# Patient Record
Sex: Male | Born: 1955
Health system: Southern US, Community
[De-identification: ages and names within clinical notes are randomized; demographics above are authoritative.]

## PROBLEM LIST (undated history)

## (undated) DIAGNOSIS — M1990 Unspecified osteoarthritis, unspecified site: Secondary | ICD-10-CM

## (undated) DIAGNOSIS — Q231 Congenital insufficiency of aortic valve: Secondary | ICD-10-CM

## (undated) DIAGNOSIS — R011 Cardiac murmur, unspecified: Secondary | ICD-10-CM

## (undated) DIAGNOSIS — H9319 Tinnitus, unspecified ear: Secondary | ICD-10-CM

## (undated) DIAGNOSIS — M549 Dorsalgia, unspecified: Secondary | ICD-10-CM

## (undated) DIAGNOSIS — I059 Rheumatic mitral valve disease, unspecified: Secondary | ICD-10-CM

## (undated) DIAGNOSIS — T7840XA Allergy, unspecified, initial encounter: Secondary | ICD-10-CM

## (undated) DIAGNOSIS — M5030 Other cervical disc degeneration, unspecified cervical region: Secondary | ICD-10-CM

## (undated) DIAGNOSIS — R972 Elevated prostate specific antigen [PSA]: Secondary | ICD-10-CM

## (undated) DIAGNOSIS — Z8619 Personal history of other infectious and parasitic diseases: Secondary | ICD-10-CM

## (undated) DIAGNOSIS — G43909 Migraine, unspecified, not intractable, without status migrainosus: Secondary | ICD-10-CM

## (undated) DIAGNOSIS — Q2381 Bicuspid aortic valve: Secondary | ICD-10-CM

## (undated) DIAGNOSIS — I10 Essential (primary) hypertension: Secondary | ICD-10-CM

## (undated) HISTORY — DX: Migraine, unspecified, not intractable, without status migrainosus: G43.909

## (undated) HISTORY — PX: POLYPECTOMY: SHX149

## (undated) HISTORY — PX: WISDOM TOOTH EXTRACTION: SHX21

## (undated) HISTORY — DX: Dorsalgia, unspecified: M54.9

## (undated) HISTORY — DX: Essential (primary) hypertension: I10

## (undated) HISTORY — PX: OTHER SURGICAL HISTORY: SHX169

## (undated) HISTORY — DX: Personal history of other infectious and parasitic diseases: Z86.19

## (undated) HISTORY — PX: SPINE SURGERY: SHX786

## (undated) HISTORY — DX: Elevated prostate specific antigen (PSA): R97.20

## (undated) HISTORY — DX: Cardiac murmur, unspecified: R01.1

## (undated) HISTORY — DX: Bicuspid aortic valve: Q23.81

## (undated) HISTORY — DX: Tinnitus, unspecified ear: H93.19

## (undated) HISTORY — PX: CYST REMOVAL NECK: SHX6281

## (undated) HISTORY — DX: Allergy, unspecified, initial encounter: T78.40XA

## (undated) HISTORY — DX: Rheumatic mitral valve disease, unspecified: I05.9

## (undated) HISTORY — PX: CERVICAL SPINE SURGERY: SHX589

## (undated) HISTORY — DX: Congenital insufficiency of aortic valve: Q23.1

## (undated) HISTORY — DX: Other cervical disc degeneration, unspecified cervical region: M50.30

## (undated) HISTORY — DX: Unspecified osteoarthritis, unspecified site: M19.90

---

## 2007-03-28 ENCOUNTER — Ambulatory Visit: Payer: Self-pay | Admitting: Family Medicine

## 2007-04-17 ENCOUNTER — Ambulatory Visit: Payer: Self-pay | Admitting: Family Medicine

## 2007-04-17 ENCOUNTER — Ambulatory Visit: Payer: Self-pay | Admitting: Gastroenterology

## 2007-04-18 ENCOUNTER — Ambulatory Visit: Payer: Self-pay | Admitting: Family Medicine

## 2007-04-18 LAB — CONVERTED CEMR LAB
BUN: 17 mg/dL (ref 6–23)
Basophils Absolute: 0 10*3/uL (ref 0.0–0.1)
Basophils Relative: 0.5 % (ref 0.0–1.0)
Calcium: 9.2 mg/dL (ref 8.4–10.5)
Chloride: 108 meq/L (ref 96–112)
Creatinine, Ser: 1.1 mg/dL (ref 0.4–1.5)
Glucose, Bld: 103 mg/dL — ABNORMAL HIGH (ref 70–99)
Hemoglobin: 16.2 g/dL (ref 13.0–17.0)
MCV: 94.5 fL (ref 78.0–100.0)
Monocytes Absolute: 0.5 10*3/uL (ref 0.2–0.7)
Neutro Abs: 6.3 10*3/uL (ref 1.4–7.7)
Platelets: 239 10*3/uL (ref 150–400)
Potassium: 4.1 meq/L (ref 3.5–5.1)
RDW: 12.5 % (ref 11.5–14.6)
Sodium: 142 meq/L (ref 135–145)
WBC: 9.7 10*3/uL (ref 4.5–10.5)

## 2007-04-25 ENCOUNTER — Ambulatory Visit: Payer: Self-pay | Admitting: Family Medicine

## 2007-05-01 ENCOUNTER — Encounter: Payer: Self-pay | Admitting: Gastroenterology

## 2007-05-01 ENCOUNTER — Ambulatory Visit: Payer: Self-pay | Admitting: Gastroenterology

## 2008-06-24 ENCOUNTER — Ambulatory Visit: Payer: Self-pay | Admitting: Family Medicine

## 2008-06-24 LAB — CONVERTED CEMR LAB
Albumin: 4.3 g/dL (ref 3.5–5.2)
Basophils Absolute: 0.1 10*3/uL (ref 0.0–0.1)
Basophils Relative: 0.6 % (ref 0.0–3.0)
Blood in Urine, dipstick: NEGATIVE
Calcium: 9.1 mg/dL (ref 8.4–10.5)
Chloride: 105 meq/L (ref 96–112)
Cholesterol: 157 mg/dL (ref 0–200)
Creatinine, Ser: 1 mg/dL (ref 0.4–1.5)
Eosinophils Absolute: 0.2 10*3/uL (ref 0.0–0.7)
Eosinophils Relative: 2.9 % (ref 0.0–5.0)
Hemoglobin: 16.4 g/dL (ref 13.0–17.0)
Ketones, urine, test strip: NEGATIVE
MCHC: 35.3 g/dL (ref 30.0–36.0)
MCV: 96.1 fL (ref 78.0–100.0)
Monocytes Relative: 7 % (ref 3.0–12.0)
Neutrophils Relative %: 56.4 % (ref 43.0–77.0)
Nitrite: NEGATIVE
Potassium: 4.3 meq/L (ref 3.5–5.1)
TSH: 2.41 microintl units/mL (ref 0.35–5.50)
Total Bilirubin: 0.9 mg/dL (ref 0.3–1.2)
Total CHOL/HDL Ratio: 2.2
Total Protein: 7 g/dL (ref 6.0–8.3)
VLDL: 10 mg/dL (ref 0–40)
WBC Urine, dipstick: NEGATIVE
pH: 7.5

## 2008-07-07 ENCOUNTER — Ambulatory Visit: Payer: Self-pay | Admitting: Family Medicine

## 2009-06-29 ENCOUNTER — Ambulatory Visit: Payer: Self-pay | Admitting: Family Medicine

## 2009-06-29 LAB — CONVERTED CEMR LAB
AST: 24 units/L (ref 0–37)
BUN: 17 mg/dL (ref 6–23)
Basophils Absolute: 0 10*3/uL (ref 0.0–0.1)
Basophils Relative: 0.5 % (ref 0.0–3.0)
Bilirubin Urine: NEGATIVE
Blood in Urine, dipstick: NEGATIVE
CO2: 28 meq/L (ref 19–32)
Creatinine, Ser: 1 mg/dL (ref 0.4–1.5)
Eosinophils Absolute: 0.2 10*3/uL (ref 0.0–0.7)
Eosinophils Relative: 2 % (ref 0.0–5.0)
GFR calc non Af Amer: 83.67 mL/min (ref >60)
Hemoglobin: 16.5 g/dL (ref 13.0–17.0)
LDL Cholesterol: 84 mg/dL (ref 0–99)
Lymphocytes Relative: 31.2 % (ref 12.0–46.0)
Lymphs Abs: 2.6 10*3/uL (ref 0.7–4.0)
MCHC: 34.2 g/dL (ref 30.0–36.0)
MCV: 96.9 fL (ref 78.0–100.0)
Monocytes Absolute: 0.5 10*3/uL (ref 0.1–1.0)
Monocytes Relative: 6.5 % (ref 3.0–12.0)
Neutro Abs: 5 10*3/uL (ref 1.4–7.7)
Potassium: 4.7 meq/L (ref 3.5–5.1)
Total Bilirubin: 0.8 mg/dL (ref 0.3–1.2)
Total CHOL/HDL Ratio: 2
Triglycerides: 43 mg/dL (ref 0.0–149.0)
VLDL: 8.6 mg/dL (ref 0.0–40.0)
pH: 6

## 2009-07-08 ENCOUNTER — Ambulatory Visit: Payer: Self-pay | Admitting: Family Medicine

## 2009-07-08 DIAGNOSIS — J310 Chronic rhinitis: Secondary | ICD-10-CM

## 2009-07-08 DIAGNOSIS — H9319 Tinnitus, unspecified ear: Secondary | ICD-10-CM | POA: Insufficient documentation

## 2009-07-08 DIAGNOSIS — I059 Rheumatic mitral valve disease, unspecified: Secondary | ICD-10-CM

## 2009-07-08 DIAGNOSIS — R972 Elevated prostate specific antigen [PSA]: Secondary | ICD-10-CM | POA: Insufficient documentation

## 2009-07-08 DIAGNOSIS — F17200 Nicotine dependence, unspecified, uncomplicated: Secondary | ICD-10-CM | POA: Insufficient documentation

## 2009-07-09 ENCOUNTER — Telehealth: Payer: Self-pay | Admitting: *Deleted

## 2010-02-18 ENCOUNTER — Ambulatory Visit: Payer: Self-pay | Admitting: Family Medicine

## 2010-02-18 DIAGNOSIS — M5030 Other cervical disc degeneration, unspecified cervical region: Secondary | ICD-10-CM

## 2010-03-01 ENCOUNTER — Encounter: Admission: RE | Admit: 2010-03-01 | Discharge: 2010-05-03 | Payer: Self-pay | Admitting: Family Medicine

## 2010-03-18 ENCOUNTER — Ambulatory Visit: Payer: Self-pay | Admitting: Family Medicine

## 2010-03-24 ENCOUNTER — Encounter (INDEPENDENT_AMBULATORY_CARE_PROVIDER_SITE_OTHER): Payer: Self-pay | Admitting: *Deleted

## 2010-06-15 ENCOUNTER — Telehealth: Payer: Self-pay | Admitting: Family Medicine

## 2010-11-06 HISTORY — PX: COLONOSCOPY: SHX174

## 2010-12-07 NOTE — Progress Notes (Signed)
Summary: Refill Hydroco/APAP  Phone Note From Pharmacy   Caller: [facility] [address] [phone]* Summary of Call: Refill request for Hydroco/APAP 7.5/750 Tab for quanity of 30. Last fill date was: 03/18/10 per our computer system. Pharmacy last fill date is: 05/14/10 Initial call taken by: Kathrynn Speed CMA,  June 15, 2010 1:47 PM  Follow-up for Phone Call        if patient requires continued use of pain medication and has not gone to PT would recommend PT if these gone to PT, then, we need a consult from neurosurgery.  Dispense 20 tablets and given this information Follow-up by: Roderick Pee MD,  June 16, 2010 8:34 AM  Additional Follow-up for Phone Call Additional follow up Details #1::        left message on machine for patient.  rx called in Additional Follow-up by: Kern Reap CMA Duncan Dull),  June 17, 2010 2:53 PM

## 2010-12-07 NOTE — Letter (Signed)
Summary: Colonoscopy Letter  [facility]  [address] [place], [place] [address]   Phone: [phone]  Fax: [phone]      Mar 24, 2010 MRN: [id number]   David Cunningham [address] [place], [place]  [address]   Dear Mr. Wandrey,   According to your medical record, it is time for you to schedule a Colonoscopy. The American Cancer Society recommends this procedure as a method to detect early colon cancer. Patients with a family history of colon cancer, or a personal history of colon polyps or inflammatory bowel disease are at increased risk.  This letter has beeen generated based on the recommendations made at the time of your procedure. If you feel that in your particular situation this may no longer apply, please contact our office.  Please call our office at [phone] to schedule this appointment or to update your records at your earliest convenience.  Thank you for cooperating with [place] to provide you with the very best care possible.   Sincerely,   Vania Rea. Jarold Motto, M.D.  [facility] [phone]

## 2010-12-07 NOTE — Assessment & Plan Note (Signed)
Summary: fu from physical therapy/njr   Vital Signs:  Patient profile:   55 year old male Weight:      162 pounds Temp:     98.1 degrees F oral BP sitting:   120 / 70  (left arm) CC: FU/ Phy Thp   CC:  FU/ Phy Thp.  History of Present Illness: David Cunningham is a 55 year old, married male, smoker, who comes back today for follow-up of neck pain.  We saw him a couple weeks ago with atraumatic.  Neck pain at that time.  His neurologic exam is normal.  X-rays show some degenerative changes with a bone spur and   foraminal narrowing at C4-C5.  We started him on physical therapy Motrin, Flexeril, and Vicodin with a soft cervical collar at bed  time.  He states his pain had decreased to about a 3 from an 8 until Tuesday of this week when he played golf and now is pain = 8.neurologic review of systems again, negative.  No numbness, weakness, etc.  I emphasized the importance of smoking cessation however, he is not started the program, that we outlined encouraged him to stop smoking and start the chantix program as outlined  Allergies: No Known Drug Allergies  Social History: Reviewed history from 07/08/2009 and no changes required. Occupation: Married Alcohol use-no Drug use-no Regular exercise-yes Current Smoker  Review of Systems      See HPI  Physical Exam  General:  Well-developed,well-nourished,in no acute distress; alert,appropriate and cooperative throughout examination Msk:  No deformity or scoliosis noted of thoracic or lumbar spine.   Pulses:  R and L carotid,radial,femoral,dorsalis pedis and posterior tibial pulses are full and equal bilaterally Extremities:  No clubbing, cyanosis, edema, or deformity noted with normal full range of motion of all joints.   Neurologic:  No cranial nerve deficits noted. Station and gait are normal. Plantar reflexes are down-going bilaterally. DTRs are symmetrical throughout. Sensory, motor and coordinative functions appear intact.   Impression  & Recommendations:  Problem # 1:  DEGENERATIVE DISC DISEASE, CERVICAL SPINE (ICD-722.4) Assessment Unchanged  Orders: Neurosurgeon Referral (Neurosurgeon) Tobacco use cessation intermediate 3-10 minutes (25956)  Complete Medication List: 1)  Chantix Starting Month Pak 0.5 Mg X 11 & 1 Mg X 42 Tabs (Varenicline tartrate) .... Uad 2)  Chantix Continuing Month Pak 1 Mg Tabs (Varenicline tartrate) .... Uad 3)  Zyrtec Hives Relief 10 Mg Tabs (Cetirizine hcl) .... One tab by mouth once daily 4)  Flexeril 10 Mg Tabs (Cyclobenzaprine hcl) .Marland Kitchen.. 1 tab @ bedtime 5)  Vicodin Es 7.5-750 Mg Tabs (Hydrocodone-acetaminophen) .Marland Kitchen.. 1 tab @ bedtime  Patient Instructions: 1)  we will set you up a consult with Dr. Barnett Abu neurosurgeon for further evaluation.  In the meantime, continue the treatment program that was outlined. 2)  Strongly strongly consider starting the chantix program!!!!!!!!!!!! Prescriptions: VICODIN ES 7.5-750 MG TABS (HYDROCODONE-ACETAMINOPHEN) 1 tab @ bedtime  #40 x 1   Entered and Authorized by:   Roderick Pee MD   Signed by:   Roderick Pee MD on 03/18/2010   Method used:   Print then Give to Patient   RxID:   [id number] FLEXERIL 10 MG TABS (CYCLOBENZAPRINE HCL) 1 tab @ bedtime  #40 x 1   Entered and Authorized by:   Roderick Pee MD   Signed by:   Roderick Pee MD on 03/18/2010   Method used:   Print then Give to Patient   RxID:   [id number]

## 2010-12-07 NOTE — Assessment & Plan Note (Signed)
Summary: neck pain//ccm   Vital Signs:  Patient profile:   55 year old male Weight:      162 pounds BP sitting:   124 / 78  (left arm) Cuff size:   regular  Vitals Entered By: Kern Reap CMA Duncan Dull) (February 18, 2010 10:57 AM) CC: neck pain Is Patient Diabetic? No Pain Assessment Patient in pain? yes     Location: neck Intensity: 5 Type: shooting   CC:  neck pain.  History of Present Illness: David Cunningham is a 55 year old male, smoker, who comes in today for evaluation of neck pain.  He states about two months ago he began having neck pain.  He points to the C2, C3, area in the midline.  He, states it's constant does not radiate however, his pain is increased over the last month, now, as an 8 on a scale of one to 10 and he can't sleep at night.  No previous history of pain like this before.  He said no sensory loss.  No change in muscle strength.  Again, no history of trauma.  Back in September.  We put him on a chantix program for smoking cessation.  He never got it filled  Allergies: No Known Drug Allergies  Past History:  Past medical, surgical, family and social histories (including risk factors) reviewed for relevance to current acute and chronic problems.  Past Medical History: Reviewed history from 07/07/2008 and no changes required. colon polyps.  Colonoscopy x 2 last 2008  Family History: Reviewed history from 07/08/2009 and no changes required. father diabetic, lymphoma, asbestosis.  Mother died of lung cancer smoker.  No brothers.  Three sisters, 200 health one has breast cancer  Social History: Reviewed history from 07/08/2009 and no changes required. Occupation: Married Alcohol use-no Drug use-no Regular exercise-yes Current Smoker  Review of Systems      See HPI  Physical Exam  General:  Well-developed,well-nourished,in no acute distress; alert,appropriate and cooperative throughout examination Head:  Normocephalic and atraumatic without  obvious abnormalities. No apparent alopecia or balding. Eyes:  No corneal or conjunctival inflammation noted. EOMI. Perrla. Funduscopic exam benign, without hemorrhages, exudates or papilledema. Vision grossly normal. Ears:  External ear exam shows no significant lesions or deformities.  Otoscopic examination reveals clear canals, tympanic membranes are intact bilaterally without bulging, retraction, inflammation or discharge. Hearing is grossly normal bilaterally. Nose:  External nasal examination shows no deformity or inflammation. Nasal mucosa are pink and moist without lesions or exudates. Mouth:  Oral mucosa and oropharynx without lesions or exudates.  Teeth in good repair. Msk:  No deformity or scoliosis noted of thoracic or lumbar spine.   Pulses:  R and L carotid,radial,femoral,dorsalis pedis and posterior tibial pulses are full and equal bilaterally Extremities:  No clubbing, cyanosis, edema, or deformity noted with normal full range of motion of all joints.   Neurologic:  No cranial nerve deficits noted. Station and gait are normal. Plantar reflexes are down-going bilaterally. DTRs are symmetrical throughout. Sensory, motor and coordinative functions appear intact.   Impression & Recommendations:  Problem # 1:  DEGENERATIVE DISC DISEASE, CERVICAL SPINE (ICD-722.4) Assessment New  Orders: T-Cervical Spine Comp w/Flex & Ext ([id number])  Problem # 2:  TOBACCO ABUSE (ICD-305.1) Assessment: Unchanged  His updated medication list for this problem includes:    Chantix Starting Month Pak 0.5 Mg X 11 & 1 Mg X 42 Tabs (Varenicline tartrate) ..... Uad    Chantix Continuing Month Pak 1 Mg Tabs (Varenicline tartrate) ..... Uad  Complete  Medication List: 1)  Chantix Starting Month Pak 0.5 Mg X 11 & 1 Mg X 42 Tabs (Varenicline tartrate) .... Uad 2)  Chantix Continuing Month Pak 1 Mg Tabs (Varenicline tartrate) .... Uad 3)  Zyrtec Hives Relief 10 Mg Tabs (Cetirizine hcl) .... One tab by mouth  once daily 4)  Flexeril 10 Mg Tabs (Cyclobenzaprine hcl) .Marland Kitchen.. 1 tab @ bedtime 5)  Vicodin Es 7.5-750 Mg Tabs (Hydrocodone-acetaminophen) .Marland Kitchen.. 1 tab @ bedtime  Patient Instructions: 1)  wear the soft cervical collar at bedtime, take 600 mg of Motrin 3 times a day with food, take a half of a Flexeril and Vicodin at bedtime, 2)  Go to the main office now for x-rays of the neck. 3)  We will get you set up to see physical therapy ASAP. 4)  After a week of physical therapy see me for follow-up.  Remember if he developed any neurologic deficit.  Call immediately Prescriptions: CHANTIX CONTINUING MONTH PAK 1 MG TABS (VARENICLINE TARTRATE) UAD  #1 x 4   Entered and Authorized by:   Roderick Pee MD   Signed by:   Roderick Pee MD on 02/18/2010   Method used:   Print then Give to Patient   RxID:   [id number] CHANTIX STARTING MONTH PAK 0.5 MG X 11 & 1 MG X 42 TABS (VARENICLINE TARTRATE) UAD  #1 x 0   Entered and Authorized by:   Roderick Pee MD   Signed by:   Roderick Pee MD on 02/18/2010   Method used:   Print then Give to Patient   RxID:   [id number] VICODIN ES 7.5-750 MG TABS (HYDROCODONE-ACETAMINOPHEN) 1 tab @ bedtime  #40 x 1   Entered and Authorized by:   Roderick Pee MD   Signed by:   Roderick Pee MD on 02/18/2010   Method used:   Print then Give to Patient   RxID:   [id number] FLEXERIL 10 MG TABS (CYCLOBENZAPRINE HCL) 1 tab @ bedtime  #40 x 1   Entered and Authorized by:   Roderick Pee MD   Signed by:   Roderick Pee MD on 02/18/2010   Method used:   Print then Give to Patient   RxID:   [id number]   Appended Document: neck pain//ccm     Allergies: No Known Drug Allergies   Complete Medication List: 1)  Chantix Starting Month Pak 0.5 Mg X 11 & 1 Mg X 42 Tabs (Varenicline tartrate) .... Uad 2)  Chantix Continuing Month Pak 1 Mg Tabs (Varenicline tartrate) .... Uad 3)  Zyrtec Hives Relief 10 Mg Tabs (Cetirizine hcl) .... One tab by mouth once  daily 4)  Flexeril 10 Mg Tabs (Cyclobenzaprine hcl) .Marland Kitchen.. 1 tab @ bedtime 5)  Vicodin Es 7.5-750 Mg Tabs (Hydrocodone-acetaminophen) .Marland Kitchen.. 1 tab @ bedtime  Other Orders: Physical Therapy Referral (PT)

## 2011-02-01 ENCOUNTER — Encounter (INDEPENDENT_AMBULATORY_CARE_PROVIDER_SITE_OTHER): Payer: Self-pay | Admitting: *Deleted

## 2011-02-07 NOTE — Letter (Signed)
Summary: Pre Visit Letter Revised  [facility]  [address] [place], [place] [address]   Phone: [phone]  Fax: [phone]        02/01/2011 MRN: [id number] David Cunningham [address] [place], [place]  [address]             Procedure Date:  03/13/2011 @ 9:30   Recall colon-Dr. Jarold Motto   Welcome to the Gastroenterology Division at [facility].    You are scheduled to see a nurse for your pre-procedure visit on 02/28/2011 at 9:00 on the 3rd floor at [facility], [address]  We ask that you try to arrive at our office 15 minutes prior to your appointment time to allow for check-in.  Please take a minute to review the attached form.  If you answer "Yes" to one or more of the questions on the first page, we ask that you call the person listed at your earliest opportunity.  If you answer "No" to all of the questions, please complete the rest of the form and bring it to your appointment.    Your nurse visit will consist of discussing your medical and surgical history, your immediate family medical history, and your medications.   If you are unable to list all of your medications on the form, please bring the medication bottles to your appointment and we will list them.  We will need to be aware of both prescribed and over the counter drugs.  We will need to know exact dosage information as well.    Please be prepared to read and sign documents such as consent forms, a financial agreement, and acknowledgement forms.  If necessary, and with your consent, a friend or relative is welcome to sit-in on the nurse visit with you.  Please bring your insurance card so that we may make a copy of it.  If your insurance requires a referral to see a specialist, please bring your referral form from your primary care physician.  No co-pay is required for this nurse visit.     If you cannot keep your appointment, please call [phone] to cancel or reschedule prior to your  appointment date.  This allows [place] the opportunity to schedule an appointment for another patient in need of care.    Thank you for choosing [facility] for your medical needs.  We appreciate the opportunity to care for you.  Please visit [place] at our website  to learn more about our practice.  Sincerely, The Gastroenterology Division

## 2011-02-24 ENCOUNTER — Other Ambulatory Visit (INDEPENDENT_AMBULATORY_CARE_PROVIDER_SITE_OTHER): Payer: Managed Care, Other (non HMO) | Admitting: Family Medicine

## 2011-02-24 DIAGNOSIS — Z0000 Encounter for general adult medical examination without abnormal findings: Secondary | ICD-10-CM

## 2011-02-24 LAB — CBC WITH DIFFERENTIAL/PLATELET
Basophils Absolute: 0.1 10*3/uL (ref 0.0–0.1)
Eosinophils Absolute: 0.2 10*3/uL (ref 0.0–0.7)
Lymphocytes Relative: 28.4 % (ref 12.0–46.0)
MCHC: 35 g/dL (ref 30.0–36.0)
MCV: 97.6 fl (ref 78.0–100.0)
Monocytes Absolute: 0.5 10*3/uL (ref 0.1–1.0)
Neutrophils Relative %: 63.5 % (ref 43.0–77.0)
Platelets: 229 10*3/uL (ref 150.0–400.0)
RBC: 4.75 Mil/uL (ref 4.22–5.81)

## 2011-02-24 LAB — POCT URINALYSIS DIPSTICK
Bilirubin, UA: NEGATIVE
Blood, UA: NEGATIVE
Nitrite, UA: NEGATIVE
Protein, UA: NEGATIVE
Urobilinogen, UA: 0.2
pH, UA: 7

## 2011-02-24 LAB — LIPID PANEL
HDL: 80.2 mg/dL (ref >39.00)
LDL Cholesterol: 77 mg/dL (ref 0–99)
Total CHOL/HDL Ratio: 2
Triglycerides: 32 mg/dL (ref 0.0–149.0)
VLDL: 6.4 mg/dL (ref 0.0–40.0)

## 2011-02-24 LAB — BASIC METABOLIC PANEL
BUN: 14 mg/dL (ref 6–23)
Calcium: 9.5 mg/dL (ref 8.4–10.5)
Creatinine, Ser: 0.9 mg/dL (ref 0.4–1.5)
GFR: 89.28 mL/min (ref >60.00)
Glucose, Bld: 84 mg/dL (ref 70–99)
Potassium: 4.8 mEq/L (ref 3.5–5.1)
Sodium: 141 mEq/L (ref 135–145)

## 2011-02-24 LAB — HEPATIC FUNCTION PANEL
Alkaline Phosphatase: 49 U/L (ref 39–117)
Bilirubin, Direct: 0.1 mg/dL (ref 0.0–0.3)
Total Bilirubin: 0.8 mg/dL (ref 0.3–1.2)

## 2011-02-28 ENCOUNTER — Encounter: Payer: Self-pay | Admitting: Gastroenterology

## 2011-02-28 ENCOUNTER — Ambulatory Visit (AMBULATORY_SURGERY_CENTER): Payer: Managed Care, Other (non HMO) | Admitting: *Deleted

## 2011-02-28 VITALS — Ht 69.0 in | Wt 170.0 lb

## 2011-02-28 DIAGNOSIS — Z800 Family history of malignant neoplasm of digestive organs: Secondary | ICD-10-CM

## 2011-02-28 DIAGNOSIS — Z86010 Personal history of colonic polyps: Secondary | ICD-10-CM

## 2011-02-28 MED ORDER — PEG-KCL-NACL-NASULF-NA ASC-C 100 G PO SOLR: ORAL | Status: DC

## 2011-03-06 ENCOUNTER — Ambulatory Visit (INDEPENDENT_AMBULATORY_CARE_PROVIDER_SITE_OTHER): Payer: Managed Care, Other (non HMO) | Admitting: Family Medicine

## 2011-03-06 ENCOUNTER — Encounter: Payer: Self-pay | Admitting: Family Medicine

## 2011-03-06 DIAGNOSIS — J309 Allergic rhinitis, unspecified: Secondary | ICD-10-CM

## 2011-03-06 DIAGNOSIS — M5030 Other cervical disc degeneration, unspecified cervical region: Secondary | ICD-10-CM

## 2011-03-06 DIAGNOSIS — R972 Elevated prostate specific antigen [PSA]: Secondary | ICD-10-CM

## 2011-03-06 DIAGNOSIS — F17200 Nicotine dependence, unspecified, uncomplicated: Secondary | ICD-10-CM

## 2011-03-06 DIAGNOSIS — I059 Rheumatic mitral valve disease, unspecified: Secondary | ICD-10-CM

## 2011-03-06 MED ORDER — VARENICLINE TARTRATE 1 MG PO TABS: 1.0000 mg | ORAL_TABLET | Freq: Two times a day (BID) | ORAL | Status: DC

## 2011-03-06 NOTE — Patient Instructions (Signed)
Start the chantix program by taking a half a tablet daily.  If after 6 weeks. u  Do  not stop smoking on  a half a pill a day, then increase the dose to half a tablet twice daily.  Return annually for general physical exam

## 2011-03-06 NOTE — Progress Notes (Signed)
  Subjective:    Patient ID: David Cunningham, male    DOB: Aug 22, 1956, 55 y.o.   MRN: [id number]  HPI  David Cunningham is a 55 year old, married male, smoker,,,,,,,,, his wife also smokes,,,,,,,, who comes in today for general physical examination  He has a history of cervical degenerative disease.  We tried conservative therapy.  It did work.  He went to see the neurosurgeon.  He did not find anything surgically that can do, however, they did again, as we have in the past.  Recommend that he stop smoking, which he is not done.  These might want to try the chantix program.  His history of a mitral valve prolapse and heart murmur currently asymptomatic.  He has a history of allergic rhinitis, which is exacerbated by a smoking.  He did have a follow-up colonoscopy next week, because he's had a history of colon polyps.  He has a history of allergic rhinitis, for which he takes OTC Zyrtec.    Review of Systems  Constitutional: Negative.   HENT: Negative.   Eyes: Negative.   Respiratory: Negative.   Cardiovascular: Negative.   Gastrointestinal: Negative.   Genitourinary: Negative.   Musculoskeletal: Negative.   Skin: Negative.   Neurological: Negative.   Hematological: Negative.   Psychiatric/Behavioral: Negative.        Objective:   Physical Exam  Constitutional: He is oriented to person, place, and time. He appears well-developed and well-nourished.  HENT:  Head: Normocephalic and atraumatic.  Right Ear: External ear normal.  Left Ear: External ear normal.  Nose: Nose normal.  Mouth/Throat: Oropharynx is clear and moist.  Eyes: Conjunctivae and EOM are normal. Pupils are equal, round, and reactive to light.  Neck: Normal range of motion. Neck supple. No JVD present. No tracheal deviation present. No thyromegaly present.  Cardiovascular: Normal rate, regular rhythm and intact distal pulses.  Exam reveals no gallop and no friction rub.   Murmur heard.      Grade 2/6 systolic ejection  murmur heard best around the apex, consistent with mitral regurgitation  Pulmonary/Chest: Effort normal and breath sounds normal. No stridor. No respiratory distress. He has no wheezes. He has no rales. He exhibits no tenderness.  Abdominal: Soft. Bowel sounds are normal. He exhibits no distension and no mass. There is no tenderness. There is no rebound and no guarding.  Genitourinary: Rectum normal, prostate normal and penis normal. Guaiac negative stool. No penile tenderness.  Musculoskeletal: Normal range of motion. He exhibits no edema and no tenderness.  Lymphadenopathy:    He has no cervical adenopathy.  Neurological: He is alert and oriented to person, place, and time. He has normal reflexes. No cranial nerve deficit. He exhibits normal muscle tone.  Skin: Skin is warm and dry. No rash noted. No erythema. No pallor.  Psychiatric: He has a normal mood and affect. His behavior is normal. Judgment and thought content normal.          Assessment & Plan:  Healthy male.  Tobacco abuse,,,,,,,,,,, encouraged to start the chantix program.  Allergic rhinitis.  Continue OTC Zyrtec.  History of cervical degenerative disk disease,,,,,,,,, stop smoking.  History of colon polyps, follow-up colonoscopy in GI with Dr. Jarold Motto

## 2011-03-10 ENCOUNTER — Encounter: Payer: Self-pay | Admitting: Gastroenterology

## 2011-03-13 ENCOUNTER — Ambulatory Visit (AMBULATORY_SURGERY_CENTER): Payer: Managed Care, Other (non HMO) | Admitting: Gastroenterology

## 2011-03-13 ENCOUNTER — Encounter: Payer: Self-pay | Admitting: Gastroenterology

## 2011-03-13 VITALS — BP 155/71 | HR 74 | Temp 96.5°F | Resp 20 | Ht 68.5 in | Wt 165.0 lb

## 2011-03-13 DIAGNOSIS — Z86010 Personal history of colonic polyps: Secondary | ICD-10-CM

## 2011-03-13 DIAGNOSIS — K5730 Diverticulosis of large intestine without perforation or abscess without bleeding: Secondary | ICD-10-CM

## 2011-03-13 DIAGNOSIS — D126 Benign neoplasm of colon, unspecified: Secondary | ICD-10-CM

## 2011-03-13 DIAGNOSIS — Z800 Family history of malignant neoplasm of digestive organs: Secondary | ICD-10-CM

## 2011-03-13 MED ORDER — SODIUM CHLORIDE 0.9 % IV SOLN: 500.0000 mL | INTRAVENOUS | Status: DC

## 2011-03-13 NOTE — Patient Instructions (Signed)
Please review discharge instructions.

## 2011-03-14 ENCOUNTER — Telehealth: Payer: Self-pay

## 2011-03-14 NOTE — Telephone Encounter (Signed)
No answer

## 2011-03-17 ENCOUNTER — Encounter: Payer: Self-pay | Admitting: Gastroenterology

## 2011-11-14 ENCOUNTER — Encounter: Payer: Self-pay | Admitting: Family Medicine

## 2011-11-14 ENCOUNTER — Ambulatory Visit (INDEPENDENT_AMBULATORY_CARE_PROVIDER_SITE_OTHER): Payer: Managed Care, Other (non HMO) | Admitting: Family Medicine

## 2011-11-14 DIAGNOSIS — F17200 Nicotine dependence, unspecified, uncomplicated: Secondary | ICD-10-CM

## 2011-11-14 DIAGNOSIS — J45909 Unspecified asthma, uncomplicated: Secondary | ICD-10-CM

## 2011-11-14 DIAGNOSIS — I059 Rheumatic mitral valve disease, unspecified: Secondary | ICD-10-CM

## 2011-11-14 DIAGNOSIS — J309 Allergic rhinitis, unspecified: Secondary | ICD-10-CM

## 2011-11-14 DIAGNOSIS — J45901 Unspecified asthma with (acute) exacerbation: Secondary | ICD-10-CM

## 2011-11-14 MED ORDER — VARENICLINE TARTRATE 1 MG PO TABS: ORAL_TABLET | ORAL | Status: DC

## 2011-11-14 MED ORDER — PREDNISONE 20 MG PO TABS: ORAL_TABLET | ORAL | Status: DC

## 2011-11-14 MED ORDER — HYDROCODONE-HOMATROPINE 5-1.5 MG/5ML PO SYRP: ORAL_SOLUTION | ORAL | Status: DC

## 2011-11-14 NOTE — Progress Notes (Signed)
  Subjective:    Patient ID: David Cunningham, male    DOB: 04-20-1956, 56 y.o.   MRN: [id number]  HPI David Cunningham is a 56 year old, married male, smoker, who comes in today for evaluation of head congestion and cough for about two weeks.  He went to an urgent care center about for 5 days after developing cold symptoms.  Was given and an ABI, but it didn't help.  He continues to have head congestion, postnasal drip, and cough.  No fever.  He continues to smoke about 10 cigarettes per day.  He states they listen to his neck and heard something in his left carotid.  He has has a history of mitral valve prolapse, but no carotid bruits in the past   Review of Systems    General cardiovascular is systems otherwise negative Objective:   Physical Exam Well-developed well-nourished, male in no acute distress.  HEENT were negative.  Neck was supple.  No adenopathy.  No bruits.  Lungs were clear except for some late mild expiratory wheezing.  Cardiac exam shows a murmur consistent with mitral valve prolapse, which has previously been present.  No changes       Assessment & Plan:  Viral syndrome with secondary asthma.  Tobacco abuse.  Mitral valve prolapse chronic  Plan is to stop smoking completely.  Begin the chantix program, prednisone for the wheezing.  Return in one week for follow-up and

## 2011-11-14 NOTE — Patient Instructions (Signed)
Stop smoking completely and begin D. chantix one half tab daily.  Take the prednisone as directed.  Drink lots of water.  Hydromet one half to 1 teaspoon at bedtime.  Return in one week for follow-up

## 2011-11-21 ENCOUNTER — Encounter: Payer: Self-pay | Admitting: Family Medicine

## 2011-11-21 ENCOUNTER — Ambulatory Visit (INDEPENDENT_AMBULATORY_CARE_PROVIDER_SITE_OTHER): Payer: Managed Care, Other (non HMO) | Admitting: Family Medicine

## 2011-11-21 DIAGNOSIS — J45901 Unspecified asthma with (acute) exacerbation: Secondary | ICD-10-CM

## 2011-11-21 DIAGNOSIS — F17200 Nicotine dependence, unspecified, uncomplicated: Secondary | ICD-10-CM

## 2011-11-21 DIAGNOSIS — J45909 Unspecified asthma, uncomplicated: Secondary | ICD-10-CM

## 2011-11-21 NOTE — Progress Notes (Signed)
  Subjective:    Patient ID: David Cunningham, male    DOB: 05-11-1956, 56 y.o.   MRN: [id number]  HPI David Cunningham is a 56 year old male, who comes in today for follow-up of tobacco abuse, and asthma.  We saw him last week with asthma.  We start him on a smoking cessation program with chantix David Cunningham taking a half a tablet a day and has cut his cigarette consumption down to 4 cigarettes a day.  His prednisone was 40 mg a day for 5 days.  He is now down to 20 mg daily.  He says his lung function is much improved, although he still wheezing.  No major side effects from the chantix nor prednisone   Review of Systems General and pulmonary review of systems otherwise negative    Objective:   Physical Exam Well-developed well-nourished, male in no acute distress.  HEENT was negative.  Neck was supple.  No adenopathy.  Lungs show symmetrical.  Breath sounds wheezing bilaterally, but is only mild today       Assessment & Plan:  Asthma and tobacco abuse.  Plan increase the chantix to one half tablet twice daily Taper prednisone slowly.  Follow-up in 3 weeks

## 2011-11-21 NOTE — Patient Instructions (Signed)
Take one prednisone tablet daily for the next 5 days, then half a tablet a day for 5 days, then a half a tablet every other day for a 3-week taper.  Increase the chantix to one half tablet twice daily.  Go from 4 to zero!!!!!!!!!!!!!!!!!!.  Return in 3 weeks for follow-up, sooner if any problems

## 2011-12-12 ENCOUNTER — Ambulatory Visit: Payer: Managed Care, Other (non HMO) | Admitting: Family Medicine

## 2012-08-23 ENCOUNTER — Ambulatory Visit (INDEPENDENT_AMBULATORY_CARE_PROVIDER_SITE_OTHER): Payer: Managed Care, Other (non HMO) | Admitting: Internal Medicine

## 2012-08-23 ENCOUNTER — Encounter: Payer: Self-pay | Admitting: Internal Medicine

## 2012-08-23 VITALS — BP 174/88 | HR 64 | Temp 98.4°F | Wt 161.0 lb

## 2012-08-23 DIAGNOSIS — Z23 Encounter for immunization: Secondary | ICD-10-CM

## 2012-08-23 DIAGNOSIS — J309 Allergic rhinitis, unspecified: Secondary | ICD-10-CM

## 2012-08-23 DIAGNOSIS — I351 Nonrheumatic aortic (valve) insufficiency: Secondary | ICD-10-CM

## 2012-08-23 DIAGNOSIS — I359 Nonrheumatic aortic valve disorder, unspecified: Secondary | ICD-10-CM

## 2012-08-23 DIAGNOSIS — F17200 Nicotine dependence, unspecified, uncomplicated: Secondary | ICD-10-CM

## 2012-08-23 MED ORDER — FLUTICASONE PROPIONATE 50 MCG/ACT NA SUSP: 2.0000 | Freq: Every day | NASAL | Status: DC

## 2012-08-23 NOTE — Progress Notes (Signed)
Subjective:    Patient ID: David Cunningham, male    DOB: 12-Jan-1956, 56 y.o.   MRN: [id number]  HPI  56 year old patient who has a history of ongoing tobacco use as well as allergic rhinitis. 4 days ago [place] episode of dizziness clamminess weakness to the point where he pulled over to the side road for approximately 10 minutes. He has noticed some increasing sinus congestion and drainage. He is using Zyrtec button but not using fluticasone. There's been no fever. He continues to drive for the rest the week without the recurrent symptoms  Past Medical History  Diagnosis Date  . Allergy   . Arthritis   . Heart murmur     History   Social History  . Marital Status: Married    Spouse Name: N/A    Number of Children: N/A  . Years of Education: N/A   Occupational History  . Not on file.   Social History Main Topics  . Smoking status: Current Every Day Smoker -- 0.5 packs/day    Types: Cigarettes  . Smokeless tobacco: Not on file  . Alcohol Use: 3.5 oz/week    7 drink(s) per week  . Drug Use: No  . Sexually Active: Not on file   Other Topics Concern  . Not on file   Social History Narrative  . No narrative on file    Past Surgical History  Procedure Date  . Tonsillectome   . Colonoscopy   . Polypectomy     Family History  Problem Relation Age of Onset  . Colon cancer Mother   . Breast cancer Sister     No Known Allergies  Current Outpatient Prescriptions on File Prior to Visit  Medication Sig Dispense Refill  . cetirizine (ZYRTEC) 5 MG tablet Take 5 mg by mouth daily.        . fluticasone (FLONASE) 50 MCG/ACT nasal spray Place 2 sprays into the nose daily.        Current Facility-Administered Medications on File Prior to Visit  Medication Dose Route Frequency Provider Last Rate Last Dose  . 0.9 %  sodium chloride infusion  500 mL Intravenous Continuous Mardella Layman, MD        BP 174/88  Pulse 64  Temp 98.4 F (36.9 C) (Oral)  Wt  161 lb (73.029 kg)       Review of Systems  Constitutional: Positive for fatigue. Negative for fever, chills and appetite change.  HENT: Positive for congestion and rhinorrhea. Negative for hearing loss, ear pain, sore throat, trouble swallowing, neck stiffness, dental problem, voice change and tinnitus.   Eyes: Negative for pain, discharge and visual disturbance.  Respiratory: Negative for cough, chest tightness, wheezing and stridor.   Cardiovascular: Negative for chest pain, palpitations and leg swelling.  Gastrointestinal: Negative for nausea, vomiting, abdominal pain, diarrhea, constipation, blood in stool and abdominal distention.  Genitourinary: Negative for urgency, hematuria, flank pain, discharge, difficulty urinating and genital sores.  Musculoskeletal: Negative for myalgias, back pain, joint swelling, arthralgias and gait problem.  Skin: Negative for rash.  Neurological: Positive for weakness and light-headedness. Negative for dizziness, syncope, speech difficulty, numbness and headaches.  Hematological: Negative for adenopathy. Does not bruise/bleed easily.  Psychiatric/Behavioral: Negative for behavioral problems and dysphoric mood. The patient is not nervous/anxious.        Objective:   Physical Exam  Constitutional: He is oriented to person, place, and time. He appears well-developed.  HENT:  Head: Normocephalic.  Right Ear: External  ear normal.  Left Ear: External ear normal.        Nasal mucosal congestion left greater than the right  Eyes: Conjunctivae normal and EOM are normal.  Neck: Normal range of motion.  Cardiovascular: Normal rate.   Murmur heard.      Patient has a grade 2/6 systolic murmur loudest at the primary aortic area as well as a grade 3/6 murmur of AI  Pulmonary/Chest: Effort normal and breath sounds normal. No respiratory distress. He has no wheezes. He has no rales.  Abdominal: Bowel sounds are normal.  Musculoskeletal: Normal range of  motion. He exhibits no edema and no tenderness.  Neurological: He is alert and oriented to person, place, and time.  Psychiatric: He has a normal mood and affect. His behavior is normal.          Assessment & Plan:   Allergic rhinitis. Latest symptoms may be related to sinus and middle ear dysfunction. Will treat allergic rhinitis more aggressively with fluticasone and continue Zyrtec. Total smoking cessation encouraged Aortic insufficiency. We'll check a 2-D echocardiogram. Will probably need cardiology referral

## 2012-08-23 NOTE — Patient Instructions (Signed)
Acute sinusitis symptoms for less than 10 days are generally not helped by antibiotic therapy.  Use saline irrigation, warm  moist compresses and over-the-counter decongestants only as directed.  Call if there is no improvement in 5 to 7 days, or sooner if you develop increasing pain, fever, or any new symptoms.  Call or return to clinic prn if these symptoms worsen or fail to improve as anticipated.

## 2012-08-30 ENCOUNTER — Ambulatory Visit (HOSPITAL_COMMUNITY): Payer: Managed Care, Other (non HMO) | Attending: Internal Medicine

## 2012-08-30 DIAGNOSIS — F17200 Nicotine dependence, unspecified, uncomplicated: Secondary | ICD-10-CM | POA: Insufficient documentation

## 2012-08-30 DIAGNOSIS — I080 Rheumatic disorders of both mitral and aortic valves: Secondary | ICD-10-CM | POA: Insufficient documentation

## 2012-08-30 DIAGNOSIS — I351 Nonrheumatic aortic (valve) insufficiency: Secondary | ICD-10-CM

## 2012-08-30 DIAGNOSIS — I379 Nonrheumatic pulmonary valve disorder, unspecified: Secondary | ICD-10-CM | POA: Insufficient documentation

## 2012-08-30 DIAGNOSIS — R011 Cardiac murmur, unspecified: Secondary | ICD-10-CM | POA: Insufficient documentation

## 2012-08-30 DIAGNOSIS — I369 Nonrheumatic tricuspid valve disorder, unspecified: Secondary | ICD-10-CM | POA: Insufficient documentation

## 2012-08-30 NOTE — Progress Notes (Signed)
Echocardiogram performed.  

## 2012-09-04 ENCOUNTER — Other Ambulatory Visit: Payer: Self-pay | Admitting: Family Medicine

## 2012-09-04 DIAGNOSIS — R931 Abnormal findings on diagnostic imaging of heart and coronary circulation: Secondary | ICD-10-CM

## 2012-09-24 ENCOUNTER — Encounter: Payer: Self-pay | Admitting: Cardiovascular Disease

## 2012-09-24 ENCOUNTER — Ambulatory Visit (INDEPENDENT_AMBULATORY_CARE_PROVIDER_SITE_OTHER): Payer: Managed Care, Other (non HMO) | Admitting: Cardiovascular Disease

## 2012-09-24 VITALS — BP 156/87 | HR 63 | Ht 69.0 in | Wt 163.0 lb

## 2012-09-24 DIAGNOSIS — R030 Elevated blood-pressure reading, without diagnosis of hypertension: Secondary | ICD-10-CM

## 2012-09-24 DIAGNOSIS — Q231 Congenital insufficiency of aortic valve: Secondary | ICD-10-CM

## 2012-09-24 DIAGNOSIS — R9389 Abnormal findings on diagnostic imaging of other specified body structures: Secondary | ICD-10-CM

## 2012-09-24 DIAGNOSIS — I10 Essential (primary) hypertension: Secondary | ICD-10-CM | POA: Insufficient documentation

## 2012-09-24 DIAGNOSIS — R931 Abnormal findings on diagnostic imaging of heart and coronary circulation: Secondary | ICD-10-CM

## 2012-09-24 NOTE — Patient Instructions (Signed)
Your physician wants you to follow-up in:  6 months with  Dr Haywood Filler will receive a reminder letter in the mail two months in advance. If you don't receive a letter, please call our office to schedule the follow-up appointment. Your physician has requested that you have an exercise tolerance test. For further information please visit [email or web address] Please also follow instruction sheet, as given. DX ABN ECHO Your physician has requested that you have a cardiac MRI. Cardiac MRI uses a computer to create images of your heart as its beating, producing both still and moving pictures of your heart and major blood vessels. For further information please visit [email or web address]. Please follow the instruction sheet given to you today for more information. DX EVAL LV SIZE   ABN ECHO

## 2012-09-24 NOTE — Progress Notes (Signed)
Patient ID: Hawk Mones, male   DOB: [birth year], 56 y.o.   MRN: [id number] 56 yo referred by Dr Kirtland Bouchard for bicuspid AV and abnormal echo.  He has known about this for years but not been followed closely.  He indicates his valve didn't close well but didn't understand issues with bicuspid valve.  He travels a lot for work and had an episode of diaphoresis and lightheadedness.  Lasted a few minutes.  No chest pain.  Reviewed echo with patient Mild LVH 12 mm and normal EF with Moderate AR mild AS bicuspid valve with well compensated LV.  Poor visualization of ascending aortic root.  No family history of congenital disease.  No previous history of CAD.  Lightheadness has not recurred and was not postural in nature. Nonsmoker and no excess ETOH  ROS: Denies fever, malais, weight loss, blurry vision, decreased visual acuity, cough, sputum, SOB, hemoptysis, pleuritic pain, palpitaitons, heartburn, abdominal pain, melena, lower extremity edema, claudication, or rash.  All other systems reviewed and negative   General: Affect appropriate Healthy:  appears stated age HEENT: normal Neck supple with no adenopathy JVP normal no bruits no thyromegaly Lungs clear with no wheezing and good diaphragmatic motion Heart:  S1/S2 SEM and AR  murmur,rub, gallop or click PMI normal Abdomen: benighn, BS positve, no tenderness, no AAA no bruit.  No HSM or HJR Distal pulses intact with no bruits No edema Neuro non-focal Skin warm and dry No muscular weakness  Medications Current Outpatient Prescriptions  Medication Sig Dispense Refill  . cetirizine (ZYRTEC) 5 MG tablet Take 5 mg by mouth daily.        . fluticasone (FLONASE) 50 MCG/ACT nasal spray Place 2 sprays into the nose daily.  16 g  6   Current Facility-Administered Medications  Medication Dose Route Frequency Provider Last Rate Last Dose  . 0.9 %  sodium chloride infusion  500 mL Intravenous Continuous Mardella Layman, MD        Allergies Review of  patient's allergies indicates no known allergies.  Family History: Family History  Problem Relation Age of Onset  . Colon cancer Mother   . Breast cancer Sister     Social History: History   Social History  . Marital Status: Married    Spouse Name: N/A    Number of Children: N/A  . Years of Education: N/A   Occupational History  . Not on file.   Social History Main Topics  . Smoking status: Current Every Day Smoker -- 0.5 packs/day    Types: Cigarettes  . Smokeless tobacco: Not on file  . Alcohol Use: 3.5 oz/week    7 drink(s) per week  . Drug Use: No  . Sexually Active: Not on file   Other Topics Concern  . Not on file   Social History Narrative  . No narrative on file    Electrocardiogram:  Assessment and Plan

## 2012-09-24 NOTE — Assessment & Plan Note (Signed)
Likely start ACE after testing done.

## 2012-09-24 NOTE — Assessment & Plan Note (Addendum)
Long discussion with patient and wife about diagnosis of bicuspid valve, natural history and need for further w/u.  Will have cardiac MRI/aortic MRA to R/iO aortopathy and assess LV volumes.  Given dizzyness with also get ETT to assess hemodynamic response to exercise and R/O CAD.  Will likely benefit from addition of ACE for afterload reduction Will see how high BP goes with exercise.  F/U echo in 6 months.  Sees a dentist and indicated no SBE needed.

## 2012-09-30 ENCOUNTER — Encounter: Payer: Self-pay | Admitting: Cardiovascular Disease

## 2012-10-01 ENCOUNTER — Other Ambulatory Visit: Payer: Self-pay | Admitting: *Deleted

## 2012-10-01 DIAGNOSIS — I10 Essential (primary) hypertension: Secondary | ICD-10-CM

## 2012-10-07 ENCOUNTER — Telehealth: Payer: Self-pay | Admitting: Cardiovascular Disease

## 2012-10-07 NOTE — Telephone Encounter (Signed)
F/u   Pt returning nurse call,

## 2012-10-07 NOTE — Telephone Encounter (Signed)
LMTCB ./CY 

## 2012-10-07 NOTE — Telephone Encounter (Signed)
Plz return call to pt [phone], who has questions about medical care

## 2012-10-08 ENCOUNTER — Ambulatory Visit (HOSPITAL_COMMUNITY): Admission: RE | Admit: 2012-10-08 | Payer: Managed Care, Other (non HMO) | Source: Ambulatory Visit

## 2012-10-08 ENCOUNTER — Ambulatory Visit (HOSPITAL_COMMUNITY)
Admission: RE | Admit: 2012-10-08 | Discharge: 2012-10-08 | Disposition: A | Discharge status: Home / Self Care | Payer: Managed Care, Other (non HMO) | Source: Ambulatory Visit | Attending: Cardiovascular Disease | Admitting: Cardiovascular Disease

## 2012-10-08 DIAGNOSIS — Q231 Congenital insufficiency of aortic valve: Secondary | ICD-10-CM | POA: Insufficient documentation

## 2012-10-08 DIAGNOSIS — I359 Nonrheumatic aortic valve disorder, unspecified: Secondary | ICD-10-CM | POA: Insufficient documentation

## 2012-10-08 DIAGNOSIS — R9389 Abnormal findings on diagnostic imaging of other specified body structures: Secondary | ICD-10-CM | POA: Insufficient documentation

## 2012-10-08 DIAGNOSIS — R931 Abnormal findings on diagnostic imaging of heart and coronary circulation: Secondary | ICD-10-CM

## 2012-10-08 LAB — BASIC METABOLIC PANEL
BUN: 14 mg/dL (ref 6–23)
CO2: 29 mEq/L (ref 19–32)
Chloride: 102 mEq/L (ref 96–112)
Glucose, Bld: 81 mg/dL (ref 70–99)
Potassium: 3.7 mEq/L (ref 3.5–5.1)
Sodium: 139 mEq/L (ref 135–145)

## 2012-10-08 MED ORDER — GADOBENATE DIMEGLUMINE 529 MG/ML IV SOLN
25.0000 mL | Freq: Once | INTRAVENOUS | Status: AC
  Administered 2012-10-08: 25 mL via INTRAVENOUS

## 2012-10-08 NOTE — Telephone Encounter (Signed)
LMTCB ./CY 

## 2012-10-09 ENCOUNTER — Ambulatory Visit (INDEPENDENT_AMBULATORY_CARE_PROVIDER_SITE_OTHER): Payer: Managed Care, Other (non HMO) | Admitting: Physician Assistant

## 2012-10-09 DIAGNOSIS — R9389 Abnormal findings on diagnostic imaging of other specified body structures: Secondary | ICD-10-CM

## 2012-10-09 DIAGNOSIS — R931 Abnormal findings on diagnostic imaging of heart and coronary circulation: Secondary | ICD-10-CM

## 2012-10-09 NOTE — Progress Notes (Signed)
Exercise Treadmill Test  Pre-Exercise Testing Evaluation Rhythm: normal sinus  Rate: 58   PR:  .14 QRS:  .10  QT:  .41 QTc: .40           Test  Exercise Tolerance Test Ordering MD: Charlton Haws, MD  Interpreting MD: Tereso Newcomer, PA-C  Unique Test No: 1  Treadmill:  2  Indication for ETT: Abnormal EKG  Contraindication to ETT: No   Stress Modality: exercise - treadmill  Cardiac Imaging Performed: non   Protocol: standard Bruce - maximal  Max BP:  211/91  Max MPHR (bpm):  166 85% MPR (bpm):  141  MPHR obtained (bpm):  162 % MPHR obtained:  97%  Reached 85% MPHR (min:sec):  6:42 Total Exercise Time (min-sec):  9:30  Workload in METS:  10.9 Borg Scale: 17  Reason ETT Terminated:  fatigue    ST Segment Analysis At Rest: normal ST segments - no evidence of significant ST depression With Exercise: no evidence of significant ST depression  Other Information Arrhythmia:  No Angina during ETT:  absent (0) Quality of ETT:  diagnostic  ETT Interpretation:  normal - no evidence of ischemia by ST analysis  Comments: Good exercise tolerance. No chest pain.  There was dyspnea. Accelerated BP response to exercise.  PreTest: 135/70  Stage 1: 156/69  Stage 2: 204/85  Stage 3: 206/88, 211/91  1 min post: 194/81  No ST-T changes to suggest ischemia.   Recommendations: Follow up with Dr. Charlton Haws as directed. Signed, Tereso Newcomer, PA-C  3:32 PM 10/09/2012

## 2012-10-10 NOTE — Telephone Encounter (Signed)
HAD  APPT WITH SCOTT WEAVER PAC  ON 10-09-12

## 2013-01-29 ENCOUNTER — Other Ambulatory Visit: Payer: Self-pay | Admitting: *Deleted

## 2013-01-29 MED ORDER — FLUTICASONE PROPIONATE 50 MCG/ACT NA SUSP: 2.0000 | Freq: Every day | NASAL | Status: DC

## 2013-04-03 ENCOUNTER — Encounter: Payer: Self-pay | Admitting: Cardiovascular Disease

## 2013-04-03 ENCOUNTER — Other Ambulatory Visit: Payer: Self-pay | Admitting: *Deleted

## 2013-04-03 ENCOUNTER — Ambulatory Visit (INDEPENDENT_AMBULATORY_CARE_PROVIDER_SITE_OTHER): Payer: Managed Care, Other (non HMO) | Admitting: Cardiovascular Disease

## 2013-04-03 VITALS — BP 150/70 | HR 73 | Ht 69.0 in | Wt 162.0 lb

## 2013-04-03 DIAGNOSIS — Q231 Congenital insufficiency of aortic valve: Secondary | ICD-10-CM

## 2013-04-03 MED ORDER — LOSARTAN POTASSIUM 25 MG PO TABS: 25.0000 mg | ORAL_TABLET | Freq: Every day | ORAL | Status: DC

## 2013-04-03 NOTE — Assessment & Plan Note (Signed)
Counseled and discussed issues with progression of aortic root dilatation and AS  F/U with Dr Tawanna Cooler Has script for chantix

## 2013-04-03 NOTE — Patient Instructions (Addendum)
Start Cozaar 25mg  daily.  Your physician recommends that you return for lab work in: 4 weeks - BMET.  Your physician recommends that you schedule a follow-up appointment in: 8 weeks with Dr. Eden Emms.  Your physician has requested that you have an echocardiogram in November 2014. Echocardiography is a painless test that uses sound waves to create images of your heart. It provides your doctor with information about the size and shape of your heart and how well your heart's chambers and valves are working. This procedure takes approximately one hour. There are no restrictions for this procedure.   MR Angio Chest.

## 2013-04-03 NOTE — Assessment & Plan Note (Signed)
No change in murmur F/U echo in November

## 2013-04-03 NOTE — Progress Notes (Signed)
Patient ID: David Cunningham, male   DOB: 1956/03/31, 57 y.o.   MRN: [id number] 57 yo referred by Dr Kirtland Bouchard for bicuspid AV and abnormal echo. He has known about this for years but not been followed closely. He indicates his valve didn't close well but didn't understand issues with bicuspid valve. He travels a lot for work and had an episode of diaphoresis and lightheadedness. Lasted a few minutes. No chest pain. Reviewed echo with patient Mild LVH 12 mm and normal EF with Moderate AR mild AS bicuspid valve with well compensated LV. Poor visualization of ascending aortic root. No family history of congenital disease. No previous history of CAD. Lightheadness has not recurred and was not postural in nature.   Diet has excess salt, drinks scotch most nights and smoking Discussed relationship to high BP.  Counseled for less than 10 minutes and has some motivation to quit Has script for Chantix by Dr Tawanna Cooler  ETT 10/09/12 normal with HTN response MRA 11/19  IMPRESSION:  1. Bicuspid aortic valve demonstrating regurgitation. There is associated aneurysmal dilatation of the ascending thoracic aorta, measuring 4.3 cm in greatest diameter. 2. No gross evidence of cardiac chamber enlargement or left ventricular hypertrophy. 3. Probable small hemangioma in the superior spleen measuring 13 mm.  ROS: Denies fever, malais, weight loss, blurry vision, decreased visual acuity, cough, sputum, SOB, hemoptysis, pleuritic pain, palpitaitons, heartburn, abdominal pain, melena, lower extremity edema, claudication, or rash.  All other systems reviewed and negative  General: Affect appropriate Healthy:  appears stated age HEENT: normal Neck supple with no adenopathy JVP normal no bruits no thyromegaly Lungs clear with no wheezing and good diaphragmatic motion Heart:  S1/S2 AS / AR murmur, no rub, gallop or click PMI normal Abdomen: benighn, BS positve, no tenderness, no AAA no bruit.  No HSM or HJR Distal pulses intact  with no bruits No edema Neuro non-focal Skin warm and dry No muscular weakness   Current Outpatient Prescriptions  Medication Sig Dispense Refill  . cetirizine (ZYRTEC) 5 MG tablet Take 5 mg by mouth daily.        . fluticasone (FLONASE) 50 MCG/ACT nasal spray Place 2 sprays into the nose daily.  48 g  3   Current Facility-Administered Medications  Medication Dose Route Frequency Provider Last Rate Last Dose  . 0.9 %  sodium chloride infusion  500 mL Intravenous Continuous Mardella Layman, MD        Allergies  Review of patient's allergies indicates no known allergies.  Electrocardiogram: 02/07/11  SR rate 68 PAC  Assessment and Plan

## 2013-04-03 NOTE — Assessment & Plan Note (Addendum)
Lifesytle modifications with diet, smoking and ETOH discussed Start cozaar 25mg   BMET in 4 weeks and f/u with me in 8

## 2013-05-01 LAB — BASIC METABOLIC PANEL
BUN: 13 mg/dL (ref 6–23)
CO2: 27 mEq/L (ref 19–32)
Chloride: 108 mEq/L (ref 96–112)
Creat: 0.91 mg/dL (ref 0.50–1.35)
Glucose, Bld: 94 mg/dL (ref 70–99)
Potassium: 4.5 mEq/L (ref 3.5–5.3)

## 2013-06-09 ENCOUNTER — Encounter: Payer: Self-pay | Admitting: Cardiovascular Disease

## 2013-06-09 ENCOUNTER — Ambulatory Visit (INDEPENDENT_AMBULATORY_CARE_PROVIDER_SITE_OTHER): Payer: Managed Care, Other (non HMO) | Admitting: Cardiovascular Disease

## 2013-06-09 VITALS — BP 132/70 | HR 65 | Ht 69.0 in | Wt 164.0 lb

## 2013-06-09 DIAGNOSIS — I350 Nonrheumatic aortic (valve) stenosis: Secondary | ICD-10-CM

## 2013-06-09 DIAGNOSIS — I359 Nonrheumatic aortic valve disorder, unspecified: Secondary | ICD-10-CM

## 2013-06-09 NOTE — Assessment & Plan Note (Signed)
Well controlled.  Continue current medications and low sodium Dash type diet.   Continue ARB  

## 2013-06-09 NOTE — Assessment & Plan Note (Signed)
F/U Dr Tawanna Cooler will discuss quit date with wife  Chantix tolerated before

## 2013-06-09 NOTE — Progress Notes (Signed)
Patient ID: David Cunningham, male   DOB: 1955-12-22, 57 y.o.   MRN: [id number] 57 yo referred by Dr Kirtland Bouchard for bicuspid AV and abnormal echo. He has known about this for years but not been followed closely. He indicates his valve didn't close well but didn't understand issues with bicuspid valve. He travels a lot for work and had an episode of diaphoresis and lightheadedness. Lasted a few minutes. No chest pain. Reviewed echo with patient Mild LVH 12 mm and normal EF with Moderate AR mild AS bicuspid valve with well compensated LV. Poor visualization of ascending aortic root. No family history of congenital disease. No previous history of CAD. Lightheadness has not recurred and was not postural in nature.  Diet has excess salt, drinks scotch most nights and smoking Discussed relationship to high BP. Counseled for less than 10 minutes and has some motivation to quit Has script for Chantix by Dr Tawanna Cooler  Last year but didn't stick with it  Smokes 1/2 ppd during week and more weekends.  Wife also smokes  ETT 10/09/12 normal with HTN response  MRA 11/19  IMPRESSION:  1. Bicuspid aortic valve demonstrating regurgitation. There is associated aneurysmal dilatation of the ascending thoracic aorta, measuring 4.3 cm in greatest diameter. 2. No gross evidence of cardiac chamber enlargement or left ventricular hypertrophy. 3. Probable small hemangioma in the superior spleen measuring 13 mm.   ROS: Denies fever, malais, weight loss, blurry vision, decreased visual acuity, cough, sputum, SOB, hemoptysis, pleuritic pain, palpitaitons, heartburn, abdominal pain, melena, lower extremity edema, claudication, or rash.  All other systems reviewed and negative  General: Affect appropriate Healthy:  appears stated age HEENT: normal Neck supple with no adenopathy JVP normal no bruits no thyromegaly Lungs clear with no wheezing and good diaphragmatic motion Heart:  S1/S2 mild AS and AR  murmur, no rub, gallop or click PMI  normal Abdomen: benighn, BS positve, no tenderness, no AAA no bruit.  No HSM or HJR Distal pulses intact with no bruits No edema Neuro non-focal Skin warm and dry No muscular weakness   Current Outpatient Prescriptions  Medication Sig Dispense Refill  . cetirizine (ZYRTEC) 5 MG tablet Take 5 mg by mouth daily.        . fluticasone (FLONASE) 50 MCG/ACT nasal spray Place 2 sprays into the nose daily.  48 g  3  . losartan (COZAAR) 25 MG tablet Take 1 tablet (25 mg total) by mouth daily.  90 tablet  3   Current Facility-Administered Medications  Medication Dose Route Frequency Provider Last Rate Last Dose  . 0.9 %  sodium chloride infusion  500 mL Intravenous Continuous Mardella Layman, MD        Allergies  Review of patient's allergies indicates no known allergies.  Electrocardiogram:  NSR rate 65 LVH    Assessment and Plan

## 2013-06-09 NOTE — Assessment & Plan Note (Signed)
Asymptomatic F/U echo 10/14  No change in murmurs

## 2013-06-09 NOTE — Patient Instructions (Signed)
Your physician has requested that you have an echocardiogram. Echocardiography is a painless test that uses sound waves to create images of your heart. It provides your doctor with information about the size and shape of your heart and how well your heart's chambers and valves are working. This procedure takes approximately one hour. There are no restrictions for this procedure.  Your physician recommends that you continue on your current medications as directed. Please refer to the Current Medication list given to you today.  Your physician wants you to follow-up in: 1 year. You will receive a reminder letter in the mail two months in advance. If you don't receive a letter, please call our office to schedule the follow-up appointment.

## 2013-06-25 ENCOUNTER — Telehealth: Payer: Self-pay | Admitting: *Deleted

## 2013-06-25 DIAGNOSIS — Q2381 Bicuspid aortic valve: Secondary | ICD-10-CM

## 2013-06-25 DIAGNOSIS — Q231 Congenital insufficiency of aortic valve: Secondary | ICD-10-CM

## 2013-06-25 NOTE — Telephone Encounter (Signed)
PT  HAD APPT IN MAY  AND  CHEST MRI WAS ORDERED  BUT WAS NEVER  DONE PT IS SCHEDULED FOR  AN ECHO   IN  November  DOES   PT  STILL NEED TO HAVE   MRI DONE ?

## 2013-06-25 NOTE — Telephone Encounter (Signed)
Message copied by Alois Cliche on Wed Jun 25, 2013 10:25 AM ------      Message from: Carmelina Paddock      Created: Wed Jun 25, 2013 10:04 AM      Regarding: MRA chest       Good morning,            There has been an order for Mr. Heffern since May for MRA of the chest.             I was waiting on it to be scheduled to obtain precert.      Does he want this soon or is this expected at a later date?            Thanks,            Morrie Sheldon         ------

## 2013-06-26 NOTE — Telephone Encounter (Signed)
Yes can order cardiac MRI with aortic MRA for bicuspid AV and aneurysm.  If cardiac portion not aproved and just order aortic MRA for aneurysm and radiology can read that

## 2013-06-27 NOTE — Telephone Encounter (Signed)
ORDER ENTERED  AND  NOTE SENT TO  SCHEDULER TO MAKE APPT./CY

## 2013-06-27 NOTE — Telephone Encounter (Signed)
I have requested authorization for Cardiac MRI and chest MRA with [employer] before scheduling.

## 2013-07-03 NOTE — Telephone Encounter (Signed)
New Problem   Pt would like to discuss scheduled appts on 9/2.   Pt request a call back at [phone]

## 2013-07-04 ENCOUNTER — Telehealth: Payer: Self-pay | Admitting: Cardiovascular Disease

## 2013-07-04 NOTE — Telephone Encounter (Signed)
Spoke with patient who wanted to know why a repeat MR chest was ordered since he just had one last December. I advised that the test was ordered in May but never scheduled, probably ordered for 6 month follow-up.  Patient states he would like to wait until he sees Dr. Eden Emms in November to reschedule this test.  I scheduled patient for 11/4 @ 0815.  Patient verbalized understanding and agreement with plan of care.

## 2013-07-04 NOTE — Telephone Encounter (Signed)
Pt calling re wanting more details re the MR cardiac chest scheduled for 07-08-13

## 2013-07-08 ENCOUNTER — Ambulatory Visit (HOSPITAL_COMMUNITY): Payer: Managed Care, Other (non HMO)

## 2013-07-08 ENCOUNTER — Ambulatory Visit (HOSPITAL_COMMUNITY): Admission: RE | Admit: 2013-07-08 | Payer: Managed Care, Other (non HMO) | Source: Ambulatory Visit

## 2013-07-09 NOTE — Telephone Encounter (Signed)
NOTED ./CY 

## 2013-08-12 ENCOUNTER — Ambulatory Visit (HOSPITAL_COMMUNITY): Payer: Managed Care, Other (non HMO) | Attending: Cardiology | Admitting: Radiology

## 2013-08-12 ENCOUNTER — Other Ambulatory Visit (HOSPITAL_COMMUNITY): Payer: Self-pay | Admitting: Cardiovascular Disease

## 2013-08-12 DIAGNOSIS — I359 Nonrheumatic aortic valve disorder, unspecified: Secondary | ICD-10-CM

## 2013-08-12 DIAGNOSIS — Q231 Congenital insufficiency of aortic valve: Secondary | ICD-10-CM | POA: Insufficient documentation

## 2013-08-12 DIAGNOSIS — IMO0002 Reserved for concepts with insufficient information to code with codable children: Secondary | ICD-10-CM | POA: Insufficient documentation

## 2013-08-12 DIAGNOSIS — I350 Nonrheumatic aortic (valve) stenosis: Secondary | ICD-10-CM

## 2013-08-12 DIAGNOSIS — F17200 Nicotine dependence, unspecified, uncomplicated: Secondary | ICD-10-CM | POA: Insufficient documentation

## 2013-08-12 DIAGNOSIS — I080 Rheumatic disorders of both mitral and aortic valves: Secondary | ICD-10-CM | POA: Insufficient documentation

## 2013-08-12 NOTE — Progress Notes (Signed)
Echocardiogram performed.  

## 2013-08-13 ENCOUNTER — Telehealth: Payer: Self-pay | Admitting: Cardiovascular Disease

## 2013-08-13 NOTE — Telephone Encounter (Signed)
PT AWARE OF ECHO RESULTS./CY 

## 2013-08-13 NOTE — Telephone Encounter (Signed)
New problem  Pt states he is returning Christine's call

## 2013-09-08 ENCOUNTER — Encounter: Payer: Self-pay | Admitting: Cardiovascular Disease

## 2013-09-08 ENCOUNTER — Encounter: Payer: Self-pay | Admitting: *Deleted

## 2013-09-09 ENCOUNTER — Encounter: Payer: Self-pay | Admitting: Cardiovascular Disease

## 2013-09-09 ENCOUNTER — Ambulatory Visit (INDEPENDENT_AMBULATORY_CARE_PROVIDER_SITE_OTHER): Payer: Managed Care, Other (non HMO) | Admitting: Cardiovascular Disease

## 2013-09-09 VITALS — BP 145/65 | HR 67 | Ht 69.0 in | Wt 162.8 lb

## 2013-09-09 DIAGNOSIS — Q231 Congenital insufficiency of aortic valve: Secondary | ICD-10-CM

## 2013-09-09 DIAGNOSIS — Z0000 Encounter for general adult medical examination without abnormal findings: Secondary | ICD-10-CM

## 2013-09-09 DIAGNOSIS — R030 Elevated blood-pressure reading, without diagnosis of hypertension: Secondary | ICD-10-CM

## 2013-09-09 DIAGNOSIS — J309 Allergic rhinitis, unspecified: Secondary | ICD-10-CM

## 2013-09-09 NOTE — Addendum Note (Signed)
Addended by: Linzie Collin D on: 09/09/2013 08:47 AM   Modules accepted: Orders

## 2013-09-09 NOTE — Patient Instructions (Addendum)
Your physician recommends that you schedule a follow-up appointment in: YEAR WITH DR Eden Emms  ECHO SAME  DAY  Your physician recommends that you continue on your current medications as directed. Please refer to the Current Medication list given to you today.

## 2013-09-09 NOTE — Assessment & Plan Note (Addendum)
Moderate AR on ARB Will monitor BP at home and we can escalate dose if needed  F/U MRA in a year mild aortic root Dilatation with no coarctation

## 2013-09-09 NOTE — Assessment & Plan Note (Signed)
PRN claritin  Fall allergies starting to subside

## 2013-09-09 NOTE — Progress Notes (Signed)
Patient ID: David Cunningham, male   DOB: 15-May-1956, 57 y.o.   MRN: [id number] 57 yo referred by Dr Kirtland Bouchard for bicuspid AV and abnormal echo. He has known about this for years but not been followed closely. He indicates his valve didn't close well but didn't understand issues with bicuspid valve. He travels a lot for work and had an episode of diaphoresis and lightheadedness. Lasted a few minutes. No chest pain. Reviewed echo with patient Mild LVH 12 mm and normal EF with Moderate AR mild AS bicuspid valve with well compensated LV. Poor visualization of ascending aortic root. No family history of congenital disease. No previous history of CAD. Lightheadness has not recurred and was not postural in nature.  Diet has excess salt, drinks scotch most nights and smoking Discussed relationship to high BP. Counseled for less than 10 minutes and has some motivation to quit Has script for Chantix by Dr Tawanna Cooler  ETT 10/09/12 normal with HTN response  MRA 11/19  IMPRESSION:  1. Bicuspid aortic valve demonstrating regurgitation. There is associated aneurysmal dilatation of the ascending thoracic aorta, measuring 4.3 cm in greatest diameter. 2. No gross evidence of cardiac chamber enlargement or left ventricular hypertrophy. 3. Probable small hemangioma in the superior spleen measuring 13 mm.  Echo 2014 Oct 7  Aortic root only 3.5 cm  Study Conclusions  - Left ventricle: The cavity size was normal. There was mild concentric hypertrophy. Systolic function was normal. The estimated ejection fraction was in the range of 55% to 60%. Wall motion was normal; there were no regional wall motion abnormalities. Left ventricular diastolic function parameters were normal. - Aortic valve: There is bicuspid aortic valve with moderate aortic regurgitation. There are at least two regurgitant jets. Bicuspid. There was no stenosis. Moderate regurgitation. - Aortic root: The aortic root was normal in size. - Ascending aorta: The  ascending aorta was mildly dilated. - Mitral valve: Structurally normal valve.     ROS: Denies fever, malais, weight loss, blurry vision, decreased visual acuity, cough, sputum, SOB, hemoptysis, pleuritic pain, palpitaitons, heartburn, abdominal pain, melena, lower extremity edema, claudication, or rash.  All other systems reviewed and negative  General: Affect appropriate Healthy:  appears stated age HEENT: normal Neck supple with no adenopathy JVP normal no bruits no thyromegaly Lungs clear with no wheezing and good diaphragmatic motion Heart:  S1/S2 no murmur, no rub, gallop or click PMI normal Abdomen: benighn, BS positve, no tenderness, no AAA no bruit.  No HSM or HJR Distal pulses intact with no bruits No edema Neuro non-focal Skin warm and dry No muscular weakness   Current Outpatient Prescriptions  Medication Sig Dispense Refill  . cetirizine (ZYRTEC) 5 MG tablet Take 5 mg by mouth daily.        . fluticasone (FLONASE) 50 MCG/ACT nasal spray Place 2 sprays into the nose daily.  48 g  3  . losartan (COZAAR) 25 MG tablet Take 1 tablet (25 mg total) by mouth daily.  90 tablet  3  . montelukast (SINGULAIR) 10 MG tablet        Current Facility-Administered Medications  Medication Dose Route Frequency Provider Last Rate Last Dose  . 0.9 %  sodium chloride infusion  500 mL Intravenous Continuous Mardella Layman, MD        Allergies  Review of patient's allergies indicates no known allergies.  Electrocardiogram: 8/4 SR rate 65 LVH  Assessment and Plan

## 2013-09-09 NOTE — Assessment & Plan Note (Signed)
Discussed low sodium diet He has home monitor Increase cozaar to 50 mg if runs high at home

## 2013-11-04 ENCOUNTER — Other Ambulatory Visit: Payer: Self-pay | Admitting: *Deleted

## 2013-11-04 ENCOUNTER — Telehealth: Payer: Self-pay | Admitting: *Deleted

## 2013-11-04 DIAGNOSIS — Q2549 Other congenital malformations of aorta: Secondary | ICD-10-CM

## 2013-11-04 NOTE — Telephone Encounter (Signed)
LMTCB  RE  DUE FOR  MRA  CHEST  ORDER ENTERED  AND  NOTE  SENT TO SCHEDULER TO  SET UP .Zack Seal

## 2013-11-07 ENCOUNTER — Telehealth: Payer: Self-pay | Admitting: Cardiovascular Disease

## 2013-11-07 NOTE — Telephone Encounter (Signed)
New message ° ° ° ° ° ° ° ° ° °Pt returning nurses call °

## 2013-11-07 NOTE — Telephone Encounter (Signed)
PER  LAST OFFICE NOTE   FROM 09-2013 PT  TO  RETURN  IN 1 YEAR WITH  ECHO SAME DAY  MAY  NEED  MRI  AS WELL NOT SURE WILL WAIT   TIL  AFTER  HAS  APPT WITH MD .Adonis Housekeeper

## 2013-11-13 ENCOUNTER — Other Ambulatory Visit: Payer: Managed Care, Other (non HMO)

## 2014-03-11 ENCOUNTER — Encounter: Payer: Self-pay | Admitting: Internal Medicine

## 2014-04-15 ENCOUNTER — Encounter: Payer: Self-pay | Admitting: Internal Medicine

## 2014-05-05 ENCOUNTER — Encounter: Payer: Managed Care, Other (non HMO) | Admitting: Internal Medicine

## 2014-06-24 ENCOUNTER — Telehealth: Payer: Self-pay | Admitting: Pharmacist

## 2014-06-24 NOTE — Telephone Encounter (Signed)
Patient Pre-Visit Medication Review:   Medications/Allergies/Current pharmacy (both primary and secondary) reviewed with patient and/or informant.  Patient aware of appointment on 06/25/14 at 8:45 with Dr. [name].  Time 3:40

## 2014-06-25 ENCOUNTER — Ambulatory Visit (INDEPENDENT_AMBULATORY_CARE_PROVIDER_SITE_OTHER): Payer: Managed Care, Other (non HMO) | Admitting: Cardiovascular Disease

## 2014-06-25 ENCOUNTER — Encounter: Payer: Self-pay | Admitting: Cardiovascular Disease

## 2014-06-25 VITALS — BP 130/70 | HR 62 | Ht 70.0 in | Wt 167.0 lb

## 2014-06-25 DIAGNOSIS — Q231 Congenital insufficiency of aortic valve: Secondary | ICD-10-CM

## 2014-06-25 DIAGNOSIS — I359 Nonrheumatic aortic valve disorder, unspecified: Secondary | ICD-10-CM

## 2014-06-25 DIAGNOSIS — I351 Nonrheumatic aortic (valve) insufficiency: Secondary | ICD-10-CM

## 2014-06-25 MED ORDER — LOSARTAN POTASSIUM 25 MG PO TABS: 25.0000 mg | ORAL_TABLET | Freq: Every day | ORAL | Status: DC

## 2014-06-25 NOTE — Patient Instructions (Signed)
The current medical regimen is effective;  continue present plan and medications.  Your physician has requested that you have an echocardiogram. Echocardiography is a painless test that uses sound waves to create images of your heart. It provides your doctor with information about the size and shape of your heart and how well your heart's chambers and valves are working. This procedure takes approximately one hour. There are no restrictions for this procedure.  Your physician has requested that you have a cardiac MRI/MRA. Cardiac MRI uses a computer to create images of your heart as its beating, producing both still and moving pictures of your heart and major blood vessels. For further information please visit [email or web address] Please follow the instruction sheet given to you today for more information.  Follow up in 6 months with Dr. [name].  You will receive a letter in the mail 2 months before you are due.  Please call us when you receive this letter to schedule your follow up appointment.

## 2014-06-25 NOTE — Addendum Note (Signed)
Addended by: Rush Landmark L on: 06/25/2014 11:03 AM   Modules accepted: Orders

## 2014-06-25 NOTE — Assessment & Plan Note (Signed)
NO change in AR murmur  Echo to assess degree and LV size and function.  Cardiac MRI/Aortic MRA to assess ascending aortic root known to be dilated 4.3 cm  Discussed issues regarding rate of change and cutoff of 5cm in setting of bicuspid valve

## 2014-06-25 NOTE — Assessment & Plan Note (Signed)
Well controlled.  Continue current medications and low sodium Dash type diet.    

## 2014-06-25 NOTE — Progress Notes (Signed)
Patient ID: David Cunningham, male   DOB: 1956/02/15, 58 y.o.   MRN: [id number] 58 yo referred by Dr Raliegh Ip for bicuspid AV and abnormal echo. He has known about this for years but not been followed closely. He indicates his valve didn't close well but didn't understand issues with bicuspid valve. He travels a lot for work and had an episode of diaphoresis and lightheadedness. Lasted a few minutes. No chest pain. Reviewed echo with patient Mild LVH 12 mm and normal EF with Moderate AR mild AS bicuspid valve with well compensated LV. Poor visualization of ascending aortic root. No family history of congenital disease. No previous history of CAD. Lightheadness has not recurred and was not postural in nature.  Diet has excess salt, drinks scotch most nights and smoking Discussed relationship to high BP. Counseled for less than 10 minutes and has some motivation to quit Has script for Chantix by Dr Sherren Mocha  ETT 10/09/12 normal with HTN response  MRA 11/19  IMPRESSION:  1. Bicuspid aortic valve demonstrating regurgitation. There is associated aneurysmal dilatation of the ascending thoracic aorta, measuring 4.3 cm in greatest diameter. 2. No gross evidence of cardiac chamber enlargement or left ventricular hypertrophy. 3. Probable small hemangioma in the superior spleen measuring 13 mm.  Echo 2014 Oct 7 Aortic root only 3.5 cm  Study Conclusions  - Left ventricle: The cavity size was normal. There was mild concentric hypertrophy. Systolic function was normal. The estimated ejection fraction was in the range of 55% to 60%. Wall motion was normal; there were no regional wall motion abnormalities. Left ventricular diastolic function parameters were normal. - Aortic valve: There is bicuspid aortic valve with moderate aortic regurgitation. There are at least two regurgitant jets. Bicuspid. There was no stenosis. Moderate regurgitation. - Aortic root: The aortic root was normal in size. - Ascending aorta: The  ascending aorta was mildly dilated. - Mitral valve: Structurally normal valve.    Doing allright No dyspnea chest pain or syncope Dentition in good shape   ROS: Denies fever, malais, weight loss, blurry vision, decreased visual acuity, cough, sputum, SOB, hemoptysis, pleuritic pain, palpitaitons, heartburn, abdominal pain, melena, lower extremity edema, claudication, or rash.  All other systems reviewed and negative  General: Affect appropriate Healthy:  appears stated age 62: normal Neck supple with no adenopathy JVP normal no bruits no thyromegaly Lungs clear with no wheezing and good diaphragmatic motion Heart:  S1/S2 AR  murmur, no rub, gallop or click PMI normal Abdomen: benighn, BS positve, no tenderness, no AAA no bruit.  No HSM or HJR Distal pulses intact with no bruits No edema Neuro non-focal Skin warm and dry No muscular weakness   Current Outpatient Prescriptions  Medication Sig Dispense Refill  . cetirizine (ZYRTEC) 5 MG tablet Take 5 mg by mouth daily.        . fluticasone (FLONASE) 50 MCG/ACT nasal spray Place 2 sprays into the nose daily.  48 g  3  . losartan (COZAAR) 25 MG tablet Take 1 tablet (25 mg total) by mouth daily.  90 tablet  3  . montelukast (SINGULAIR) 10 MG tablet       . naproxen sodium (ANAPROX) 220 MG tablet Take 220 mg by mouth as needed (for arthritis pain).       Current Facility-Administered Medications  Medication Dose Route Frequency Provider Last Rate Last Dose  . 0.9 %  sodium chloride infusion  500 mL Intravenous Continuous Sable Feil, MD  Allergies  Review of patient's allergies indicates no known allergies.  Electrocardiogram:  SR rate 65 LVH  2014 today SR rate 64 LVH no change   Assessment and Plan

## 2014-06-30 ENCOUNTER — Ambulatory Visit (HOSPITAL_COMMUNITY): Payer: Managed Care, Other (non HMO) | Attending: Cardiology | Admitting: Radiology

## 2014-06-30 ENCOUNTER — Other Ambulatory Visit: Payer: Self-pay | Admitting: *Deleted

## 2014-06-30 ENCOUNTER — Encounter: Payer: Self-pay | Admitting: Cardiovascular Disease

## 2014-06-30 DIAGNOSIS — I359 Nonrheumatic aortic valve disorder, unspecified: Secondary | ICD-10-CM | POA: Insufficient documentation

## 2014-06-30 DIAGNOSIS — Q2549 Other congenital malformations of aorta: Secondary | ICD-10-CM

## 2014-06-30 DIAGNOSIS — I351 Nonrheumatic aortic (valve) insufficiency: Secondary | ICD-10-CM

## 2014-06-30 DIAGNOSIS — Q231 Congenital insufficiency of aortic valve: Secondary | ICD-10-CM

## 2014-06-30 NOTE — Progress Notes (Signed)
Echocardiogram performed.  

## 2014-07-01 ENCOUNTER — Other Ambulatory Visit: Payer: Self-pay | Admitting: Internal Medicine

## 2014-07-09 ENCOUNTER — Other Ambulatory Visit: Payer: Self-pay | Admitting: *Deleted

## 2014-07-16 ENCOUNTER — Ambulatory Visit (HOSPITAL_COMMUNITY): Payer: Managed Care, Other (non HMO)

## 2014-07-16 ENCOUNTER — Ambulatory Visit (HOSPITAL_COMMUNITY)
Admission: RE | Admit: 2014-07-16 | Discharge: 2014-07-16 | Disposition: A | Discharge status: Home / Self Care | Payer: Managed Care, Other (non HMO) | Source: Ambulatory Visit | Attending: Cardiovascular Disease | Admitting: Cardiovascular Disease

## 2014-07-16 DIAGNOSIS — I351 Nonrheumatic aortic (valve) insufficiency: Secondary | ICD-10-CM

## 2014-07-16 DIAGNOSIS — I359 Nonrheumatic aortic valve disorder, unspecified: Secondary | ICD-10-CM

## 2014-07-16 DIAGNOSIS — Q2549 Other congenital malformations of aorta: Secondary | ICD-10-CM

## 2014-07-16 DIAGNOSIS — Q231 Congenital insufficiency of aortic valve: Secondary | ICD-10-CM

## 2014-07-16 LAB — CREATININE, SERUM
CREATININE: 0.87 mg/dL (ref 0.50–1.35)
GFR calc non Af Amer: >90 mL/min (ref >90)

## 2014-07-16 MED ORDER — GADOBENATE DIMEGLUMINE 529 MG/ML IV SOLN
20.0000 mL | Freq: Once | INTRAVENOUS | Status: AC
  Administered 2014-07-16: 20 mL via INTRAVENOUS

## 2014-09-29 ENCOUNTER — Encounter: Payer: Self-pay | Admitting: Physician Assistant

## 2014-09-29 ENCOUNTER — Ambulatory Visit (INDEPENDENT_AMBULATORY_CARE_PROVIDER_SITE_OTHER): Payer: Managed Care, Other (non HMO) | Admitting: Physician Assistant

## 2014-09-29 VITALS — BP 135/68 | HR 75 | Temp 98.5°F | Wt 172.2 lb

## 2014-09-29 DIAGNOSIS — J329 Chronic sinusitis, unspecified: Secondary | ICD-10-CM

## 2014-09-29 MED ORDER — METHYLPREDNISOLONE ACETATE 40 MG/ML IJ SUSP
40.0000 mg | Freq: Once | INTRAMUSCULAR | Status: AC
  Administered 2014-09-29: 40 mg via INTRAMUSCULAR

## 2014-09-29 MED ORDER — AMOXICILLIN-POT CLAVULANATE 875-125 MG PO TABS: 1.0000 | ORAL_TABLET | Freq: Two times a day (BID) | ORAL | Status: DC

## 2014-09-29 NOTE — Progress Notes (Signed)
Patient presents to clinic today c/o sinus pressure, sinus pain, worse on left.  Patient with history of sinusitis and seasonal allergies.  Patient endorses fatigue and tooth pain.  Patient denies fever, chills, muscle aches or ear pain.  Denies recent travel.    Past Medical History  Diagnosis Date  . Allergy   . Arthritis   . Heart murmur   . MITRAL VALVE PROLAPSE   . Bicuspid aortic valve   . TINNITUS, CHRONIC, BILATERAL   . DEGENERATIVE DISC DISEASE, CERVICAL SPINE   . PSA, INCREASED   . Asthma attack     Current Outpatient Prescriptions on File Prior to Visit  Medication Sig Dispense Refill  . cetirizine (ZYRTEC) 5 MG tablet Take 5 mg by mouth daily.      . fluticasone (FLONASE) 50 MCG/ACT nasal spray Place 2 sprays into the nose daily. 48 g 3  . losartan (COZAAR) 25 MG tablet Take 1 tablet (25 mg total) by mouth daily. 90 tablet 3  . montelukast (SINGULAIR) 10 MG tablet     . naproxen sodium (ANAPROX) 220 MG tablet Take 220 mg by mouth as needed (for arthritis pain).     Current Facility-Administered Medications on File Prior to Visit  Medication Dose Route Frequency Provider Last Rate Last Dose  . 0.9 %  sodium chloride infusion  500 mL Intravenous Continuous Sable Feil, MD        No Known Allergies  Family History  Problem Relation Age of Onset  . Colon cancer Mother   . Breast cancer Sister     History   Social History  . Marital Status: Married    Spouse Name: N/A    Number of Children: N/A  . Years of Education: N/A   Social History Main Topics  . Smoking status: Current Every Day Smoker -- 0.50 packs/day    Types: Cigarettes  . Smokeless tobacco: None  . Alcohol Use: 3.5 oz/week    7 drink(s) per week  . Drug Use: No  . Sexual Activity: None   Other Topics Concern  . None   Social History Narrative   Review of Systems - See HPI.  All other ROS are negative.  BP 135/68 mmHg  Pulse 75  Temp(Src) 98.5 F (36.9 C) (Oral)  Wt 172 lb  3.2 oz (78.109 kg)  SpO2 99%  Physical Exam  Constitutional: He is oriented to person, place, and time and well-developed, well-nourished, and in no distress.  HENT:  Head: Normocephalic and atraumatic.  Right Ear: Tympanic membrane, external ear and ear canal normal.  Left Ear: Tympanic membrane, external ear and ear canal normal.  Nose: Right sinus exhibits no maxillary sinus tenderness and no frontal sinus tenderness. Left sinus exhibits maxillary sinus tenderness and frontal sinus tenderness.  Mouth/Throat: Uvula is midline, oropharynx is clear and moist and mucous membranes are normal.  Eyes: Conjunctivae are normal. Pupils are equal, round, and reactive to light.  Neck: Neck supple.  Cardiovascular: Normal rate, regular rhythm, normal heart sounds and intact distal pulses.   Pulmonary/Chest: Effort normal and breath sounds normal. No respiratory distress. He has no wheezes. He has no rales. He exhibits no tenderness.  Lymphadenopathy:    He has no cervical adenopathy.  Neurological: He is alert and oriented to person, place, and time.  Skin: Skin is warm and dry. No rash noted.  Psychiatric: Affect normal.  Vitals reviewed.   Recent Results (from the past 2160 hour(s))  Creatinine, serum  Status: None   Collection Time: 07/16/14 12:17 PM  Result Value Ref Range   Creatinine, Ser 0.87 0.50 - 1.35 mg/dL   GFR calc non Af Amer >90 >90 mL/min   GFR calc Af Amer >90 >90 mL/min    Comment: (NOTE) The eGFR has been calculated using the CKD EPI equation. This calculation has not been validated in all clinical situations. eGFR's persistently <90 mL/min signify possible Chronic Kidney Disease.    Assessment/Plan: Recurrent sinusitis 40 mg IM Depomedrol given.  Rx Augmentin. Continue allergy regimen.  Increase fluids.  Rest.  Saline nasal spray.  Daily probiotic.  Referral to ENT placed giving recurrence of symptoms.      

## 2014-09-29 NOTE — Progress Notes (Signed)
Pre visit review using our clinic review tool, if applicable. No additional management support is needed unless otherwise documented below in the visit note. 

## 2014-09-29 NOTE — Patient Instructions (Signed)
Please take antibiotic as directed.  Increase fluid intake.  Use Saline nasal spray.  Take a daily multivitamin. Continue allergy medications.  Place a humidifier in the bedroom.  Please call or return clinic if symptoms are not improving. You will be contacted by ENT for evaluation.  Sinusitis Sinusitis is redness, soreness, and swelling (inflammation) of the paranasal sinuses. Paranasal sinuses are air pockets within the bones of your face (beneath the eyes, the middle of the forehead, or above the eyes). In healthy paranasal sinuses, mucus is able to drain out, and air is able to circulate through them by way of your nose. However, when your paranasal sinuses are inflamed, mucus and air can become trapped. This can allow bacteria and other germs to grow and cause infection. Sinusitis can develop quickly and last only a short time (acute) or continue over a long period (chronic). Sinusitis that lasts for more than 12 weeks is considered chronic.  CAUSES  Causes of sinusitis include:  Allergies.  Structural abnormalities, such as displacement of the cartilage that separates your nostrils (deviated septum), which can decrease the air flow through your nose and sinuses and affect sinus drainage.  Functional abnormalities, such as when the small hairs (cilia) that line your sinuses and help remove mucus do not work properly or are not present. SYMPTOMS  Symptoms of acute and chronic sinusitis are the same. The primary symptoms are pain and pressure around the affected sinuses. Other symptoms include:  Upper toothache.  Earache.  Headache.  Bad breath.  Decreased sense of smell and taste.  A cough, which worsens when you are lying flat.  Fatigue.  Fever.  Thick drainage from your nose, which often is green and may contain pus (purulent).  Swelling and warmth over the affected sinuses. DIAGNOSIS  Your caregiver will perform a physical exam. During the exam, your caregiver may:  Look  in your nose for signs of abnormal growths in your nostrils (nasal polyps).  Tap over the affected sinus to check for signs of infection.  View the inside of your sinuses (endoscopy) with a special imaging device with a light attached (endoscope), which is inserted into your sinuses. If your caregiver suspects that you have chronic sinusitis, one or more of the following tests may be recommended:  Allergy tests.  Nasal culture A sample of mucus is taken from your nose and sent to a lab and screened for bacteria.  Nasal cytology A sample of mucus is taken from your nose and examined by your caregiver to determine if your sinusitis is related to an allergy. TREATMENT  Most cases of acute sinusitis are related to a viral infection and will resolve on their own within 10 days. Sometimes medicines are prescribed to help relieve symptoms (pain medicine, decongestants, nasal steroid sprays, or saline sprays).  However, for sinusitis related to a bacterial infection, your caregiver will prescribe antibiotic medicines. These are medicines that will help kill the bacteria causing the infection.  Rarely, sinusitis is caused by a fungal infection. In theses cases, your caregiver will prescribe antifungal medicine. For some cases of chronic sinusitis, surgery is needed. Generally, these are cases in which sinusitis recurs more than 3 times per year, despite other treatments. HOME CARE INSTRUCTIONS   Drink plenty of water. Water helps thin the mucus so your sinuses can drain more easily.  Use a humidifier.  Inhale steam 3 to 4 times a day (for example, sit in the bathroom with the shower running).  Apply a warm,  moist washcloth to your face 3 to 4 times a day, or as directed by your caregiver.  Use saline nasal sprays to help moisten and clean your sinuses.  Take over-the-counter or prescription medicines for pain, discomfort, or fever only as directed by your caregiver. SEEK IMMEDIATE MEDICAL CARE  IF:  You have increasing pain or severe headaches.  You have nausea, vomiting, or drowsiness.  You have swelling around your face.  You have vision problems.  You have a stiff neck.  You have difficulty breathing. MAKE SURE YOU:   Understand these instructions.  Will watch your condition.  Will get help right away if you are not doing well or get worse. Document Released: 10/23/2005 Document Revised: 01/15/2012 Document Reviewed: 11/07/2011 [facility] Patient Information 2014 [place], [place].

## 2014-09-29 NOTE — Addendum Note (Signed)
Addended by: Harl Bowie on: 09/29/2014 04:27 PM   Modules accepted: Orders

## 2014-09-29 NOTE — Assessment & Plan Note (Signed)
40 mg IM Depomedrol given.  Rx Augmentin. Continue allergy regimen.  Increase fluids.  Rest.  Saline nasal spray.  Daily probiotic.  Referral to ENT placed giving recurrence of symptoms.

## 2014-09-30 ENCOUNTER — Telehealth: Payer: Self-pay | Admitting: Family Medicine

## 2014-09-30 NOTE — Telephone Encounter (Signed)
emmi emailed °

## 2014-10-06 ENCOUNTER — Encounter: Payer: Self-pay | Admitting: Physician Assistant

## 2014-10-06 ENCOUNTER — Ambulatory Visit (INDEPENDENT_AMBULATORY_CARE_PROVIDER_SITE_OTHER): Payer: Managed Care, Other (non HMO) | Admitting: Physician Assistant

## 2014-10-06 VITALS — BP 143/81 | HR 66 | Temp 98.4°F | Resp 16 | Ht 70.0 in | Wt 169.5 lb

## 2014-10-06 DIAGNOSIS — Z125 Encounter for screening for malignant neoplasm of prostate: Secondary | ICD-10-CM | POA: Insufficient documentation

## 2014-10-06 DIAGNOSIS — Z0000 Encounter for general adult medical examination without abnormal findings: Secondary | ICD-10-CM | POA: Insufficient documentation

## 2014-10-06 DIAGNOSIS — I059 Rheumatic mitral valve disease, unspecified: Secondary | ICD-10-CM

## 2014-10-06 DIAGNOSIS — R102 Pelvic and perineal pain: Secondary | ICD-10-CM

## 2014-10-06 DIAGNOSIS — J302 Other seasonal allergic rhinitis: Secondary | ICD-10-CM

## 2014-10-06 LAB — URINALYSIS, ROUTINE W REFLEX MICROSCOPIC
BILIRUBIN URINE: NEGATIVE
Hgb urine dipstick: NEGATIVE
Ketones, ur: NEGATIVE
LEUKOCYTES UA: NEGATIVE
Nitrite: NEGATIVE
RBC / HPF: NONE SEEN (ref 0–?)
Specific Gravity, Urine: 1.02 (ref 1.000–1.030)
Total Protein, Urine: NEGATIVE
Urine Glucose: NEGATIVE
Urobilinogen, UA: 0.2 (ref 0.0–1.0)
pH: 7 (ref 5.0–8.0)

## 2014-10-06 LAB — BASIC METABOLIC PANEL
BUN: 14 mg/dL (ref 6–23)
CALCIUM: 9.2 mg/dL (ref 8.4–10.5)
CHLORIDE: 105 meq/L (ref 96–112)
CO2: 25 meq/L (ref 19–32)
Creatinine, Ser: 1 mg/dL (ref 0.4–1.5)
GFR: 80.17 mL/min (ref >60.00)
Glucose, Bld: 99 mg/dL (ref 70–99)
Potassium: 5 mEq/L (ref 3.5–5.1)
SODIUM: 140 meq/L (ref 135–145)

## 2014-10-06 LAB — HEPATIC FUNCTION PANEL
ALK PHOS: 53 U/L (ref 39–117)
ALT: 18 U/L (ref 0–53)
AST: 23 U/L (ref 0–37)
Albumin: 4.3 g/dL (ref 3.5–5.2)
BILIRUBIN DIRECT: 0.1 mg/dL (ref 0.0–0.3)
BILIRUBIN TOTAL: 0.7 mg/dL (ref 0.2–1.2)
TOTAL PROTEIN: 6.7 g/dL (ref 6.0–8.3)

## 2014-10-06 LAB — CBC
HCT: 47.8 % (ref 39.0–52.0)
Hemoglobin: 16.1 g/dL (ref 13.0–17.0)
MCHC: 33.7 g/dL (ref 30.0–36.0)
MCV: 98.5 fl (ref 78.0–100.0)
Platelets: 263 10*3/uL (ref 150.0–400.0)
RBC: 4.86 Mil/uL (ref 4.22–5.81)
RDW: 14 % (ref 11.5–15.5)
WBC: 9.8 10*3/uL (ref 4.0–10.5)

## 2014-10-06 LAB — TSH: TSH: 2.62 u[IU]/mL (ref 0.35–4.50)

## 2014-10-06 LAB — LIPID PANEL
CHOL/HDL RATIO: 2
Cholesterol: 170 mg/dL (ref 0–200)
HDL: 71 mg/dL (ref >39.00)
LDL CALC: 93 mg/dL (ref 0–99)
NONHDL: 99
Triglycerides: 28 mg/dL (ref 0.0–149.0)
VLDL: 5.6 mg/dL (ref 0.0–40.0)

## 2014-10-06 LAB — PSA: PSA: 3.85 ng/mL (ref 0.10–4.00)

## 2014-10-06 LAB — HEMOGLOBIN A1C: Hgb A1c MFr Bld: 5.6 % (ref 4.6–6.5)

## 2014-10-06 MED ORDER — MONTELUKAST SODIUM 10 MG PO TABS: 10.0000 mg | ORAL_TABLET | Freq: Every day | ORAL | Status: DC

## 2014-10-06 NOTE — Progress Notes (Signed)
Patient presents to clinic today to establish care. Patient also requesting CPE at today's visit.  Is fasting for labs.  Acute Concerns: Patient complains of intermittent pains in left groin region.  Pains are sharp and only last a couple of seconds.  Happens a couple of times per week.  Denies trauma or heavy lifting.  Denies bulging mass.  Denies penile or testicular pain. Denies hx of hernia.  Chronic Issues: Tobacco Abuse Disorder -- Patient smoking 1/2 pack per day at present.  Is not ready to quit at present.  Handout will be given.    Allergic Rhinitis -- Well controlled with combination of daily Singulair, Zyrtec and PRN Flonase.  Denies symptoms at present.  Mitral Valve Prolapse -- Followed by Cardiology. Asymptomatic.  Last Echo Spring 2015.  Health Maintenance: Dental -- up-to-date Vision -- up-to-date Immunizations -- Patient defers flu shot.  Tetanus up-to-date Colonoscopy -- Last 2011.  Due in 2016.  Has + history of polyps.  Has follow-up with GI. Will call to schedule appointment.  Past Medical History  Diagnosis Date  . Allergy   . Arthritis   . Heart murmur   . MITRAL VALVE PROLAPSE   . Bicuspid aortic valve   . TINNITUS, CHRONIC, BILATERAL   . DEGENERATIVE DISC DISEASE, CERVICAL SPINE   . PSA, INCREASED   . History of chicken pox   . Hypertension     Past Surgical History  Procedure Laterality Date  . Tonsillectome    . Colonoscopy    . Polypectomy      Colon  . Wisdom tooth extraction    . Cyst removal neck      And Face    Current Outpatient Prescriptions on File Prior to Visit  Medication Sig Dispense Refill  . amoxicillin-clavulanate (AUGMENTIN) 875-125 MG per tablet Take 1 tablet by mouth 2 (two) times daily. 20 tablet 0  . cetirizine (ZYRTEC) 5 MG tablet Take 5 mg by mouth daily.      . fluticasone (FLONASE) 50 MCG/ACT nasal spray Place 2 sprays into the nose daily. 48 g 3  . losartan (COZAAR) 25 MG tablet Take 1 tablet (25 mg total) by  mouth daily. 90 tablet 3  . naproxen sodium (ANAPROX) 220 MG tablet Take 220 mg by mouth as needed (for arthritis pain).     Current Facility-Administered Medications on File Prior to Visit  Medication Dose Route Frequency Provider Last Rate Last Dose  . 0.9 %  sodium chloride infusion  500 mL Intravenous Continuous Sable Feil, MD        No Known Allergies  Family History  Problem Relation Age of Onset  . Colon cancer Mother 61    Deceased  . Breast cancer Sister   . Lung cancer Mother   . Liver cancer Mother   . Lymphoma Father 63    Deceased  . Diabetes Father   . Cancer Other     Paternal Grandparents  . Cancer Other     Maternal Grandparents  . Emphysema Paternal Grandfather   . Emphysema Maternal Grandfather   . Cancer Paternal Aunt   . Cancer Maternal Aunt     History   Social History  . Marital Status: Married    Spouse Name: N/A    Number of Children: N/A  . Years of Education: N/A   Occupational History  . Not on file.   Social History Main Topics  . Smoking status: Current Every Day Smoker -- 0.50 packs/day  Types: Cigarettes  . Smokeless tobacco: Never Used  . Alcohol Use: 4.2 oz/week    7 Not specified per week  . Drug Use: No  . Sexual Activity:    Partners: Female     Comment: wife   Other Topics Concern  . Not on file   Social History Narrative   Review of Systems  Constitutional: Negative for fever and weight loss.  HENT: Negative for ear discharge, ear pain, hearing loss and tinnitus.   Eyes: Negative for blurred vision, double vision, photophobia, pain and discharge.  Respiratory: Negative for cough and sputum production.   Cardiovascular: Negative for chest pain and palpitations.  Genitourinary: Negative for dysuria, urgency, frequency, hematuria and flank pain.       Nocturia x 0  Neurological: Negative for dizziness, loss of consciousness and headaches.  Endo/Heme/Allergies: Negative for environmental allergies.    Psychiatric/Behavioral: Negative for depression, suicidal ideas, hallucinations and substance abuse. The patient is not nervous/anxious and does not have insomnia.     BP 143/81 mmHg  Pulse 66  Temp(Src) 98.4 F (36.9 C) (Oral)  Resp 16  Ht '5\' 10"'  (1.778 m)  Wt 169 lb 8 oz (76.885 kg)  BMI 24.32 kg/m2  SpO2 100%  Physical Exam  Constitutional: He is oriented to person, place, and time and well-developed, well-nourished, and in no distress.  HENT:  Head: Normocephalic and atraumatic.  Right Ear: External ear normal.  Left Ear: External ear normal.  Nose: Nose normal.  Mouth/Throat: Oropharynx is clear and moist. No oropharyngeal exudate.  TM within normal limits bilaterally.  Eyes: Conjunctivae are normal. Pupils are equal, round, and reactive to light.  Neck: Normal range of motion. Neck supple.  Cardiovascular: Normal rate, regular rhythm, normal heart sounds and intact distal pulses.   Pulmonary/Chest: Effort normal and breath sounds normal. No respiratory distress. He has no wheezes. He has no rales. He exhibits no tenderness.  Abdominal: Soft. Bowel sounds are normal. He exhibits no distension and no mass. There is no tenderness. There is no rebound and no guarding. No hernia.  Genitourinary: Testes/scrotum normal and penis normal.  Lymphadenopathy:    He has no cervical adenopathy.  Neurological: He is alert and oriented to person, place, and time. No cranial nerve deficit.  Skin: Skin is warm and dry. No rash noted.  Psychiatric: Affect normal.  Vitals reviewed.  Recent Results (from the past 2160 hour(s))  Creatinine, serum     Status: None   Collection Time: 07/16/14 12:17 PM  Result Value Ref Range   Creatinine, Ser 0.87 0.50 - 1.35 mg/dL   GFR calc non Af Amer >90 >90 mL/min   GFR calc Af Amer >90 >90 mL/min    Comment: (NOTE) The eGFR has been calculated using the CKD EPI equation. This calculation has not been validated in all clinical situations. eGFR's  persistently <90 mL/min signify possible Chronic Kidney Disease.   Assessment/Plan: Mitral valve disorder Followed by Cardiology [name]). Stable.  Asymptomatic.  Follow-up with Cardiology as directed.  Allergic rhinitis Well-controlled.  Continue current regimen.  Medications refilled.  Visit for preventive health examination I have reviewed the patient's medical history in detail and updated the computerized patient record. Patient will schedule Colonoscopy. Up-to-date of Tetanus.  Defers flu shot.  PHQ-9 Depression screen negative.  Will obtain fasting labs today.   Prostate cancer screening Risks versus benefits of DRE and PSA discussed with patient.  Defers DRE.  Will obtain PSA today.  Pelvic pain in male Mild.  No evidence of hernia on examiation.  GU exam within normal limits. Suspect MSK etiology.  Encouraged supportive underwear as patient's wears boxers.  NSAIDs if needed.  If no improvement, return for reassessment and imaging.

## 2014-10-06 NOTE — Progress Notes (Signed)
Pre visit review using our clinic review tool, if applicable. No additional management support is needed unless otherwise documented below in the visit note/SLS  

## 2014-10-06 NOTE — Patient Instructions (Signed)
Please stop by the lab for blood work. I will call you with your results. Continue medications as directed.  Limit your salt intake to help keep blood pressure in a good range.  Follow-up with your Cardiologist as directed.  For the groin pain, try to wear more supportive undergarments as I think this will help your symptoms a lot.  I did not feel or see evidence of a hernia.  However, if symptoms are not improving, please return to clinic for reassessment and imaging.  Preventive Care for Adults A healthy lifestyle and preventive care can promote health and wellness. Preventive health guidelines for men include the following key practices:  A routine yearly physical is a good way to check with your health care provider about your health and preventative screening. It is a chance to share any concerns and updates on your health and to receive a thorough exam.  Visit your dentist for a routine exam and preventative care every 6 months. Brush your teeth twice a day and floss once a day. Good oral hygiene prevents tooth decay and gum disease.  The frequency of eye exams is based on your age, health, family medical history, use of contact lenses, and other factors. Follow your health care provider's recommendations for frequency of eye exams.  Eat a healthy diet. Foods such as vegetables, fruits, whole grains, low-fat dairy products, and lean protein foods contain the nutrients you need without too many calories. Decrease your intake of foods high in solid fats, added sugars, and salt. Eat the right amount of calories for you.Get information about a proper diet from your health care provider, if necessary.  Regular physical exercise is one of the most important things you can do for your health. Most adults should get at least 150 minutes of moderate-intensity exercise (any activity that increases your heart rate and causes you to sweat) each week. In addition, most adults need muscle-strengthening  exercises on 2 or more days a week.  Maintain a healthy weight. The body mass index (BMI) is a screening tool to identify possible weight problems. It provides an estimate of body fat based on height and weight. Your health care provider can find your BMI and can help you achieve or maintain a healthy weight.For adults 20 years and older:  A BMI below 18.5 is considered underweight.  A BMI of 18.5 to 24.9 is normal.  A BMI of 25 to 29.9 is considered overweight.  A BMI of 30 and above is considered obese.  Maintain normal blood lipids and cholesterol levels by exercising and minimizing your intake of saturated fat. Eat a balanced diet with plenty of fruit and vegetables. Blood tests for lipids and cholesterol should begin at age 48 and be repeated every 5 years. If your lipid or cholesterol levels are high, you are over 50, or you are at high risk for heart disease, you may need your cholesterol levels checked more frequently.Ongoing high lipid and cholesterol levels should be treated with medicines if diet and exercise are not working.  If you smoke, find out from your health care provider how to quit. If you do not use tobacco, do not start.  Lung cancer screening is recommended for adults aged 21-80 years who are at high risk for developing lung cancer because of a history of smoking. A yearly low-dose CT scan of the lungs is recommended for people who have at least a 30-pack-year history of smoking and are a current smoker or have quit within  the past 15 years. A pack year of smoking is smoking an average of 1 pack of cigarettes a day for 1 year (for example: 1 pack a day for 30 years or 2 packs a day for 15 years). Yearly screening should continue until the smoker has stopped smoking for at least 15 years. Yearly screening should be stopped for people who develop a health problem that would prevent them from having lung cancer treatment.  If you choose to drink alcohol, do not have more than  2 drinks per day. One drink is considered to be 12 ounces (355 mL) of beer, 5 ounces (148 mL) of wine, or 1.5 ounces (44 mL) of liquor.  Avoid use of street drugs. Do not share needles with anyone. Ask for help if you need support or instructions about stopping the use of drugs.  High blood pressure causes heart disease and increases the risk of stroke. Your blood pressure should be checked at least every 1-2 years. Ongoing high blood pressure should be treated with medicines, if weight loss and exercise are not effective.  If you are 70-57 years old, ask your health care provider if you should take aspirin to prevent heart disease.  Diabetes screening involves taking a blood sample to check your fasting blood sugar level. This should be done once every 3 years, after age 74, if you are within normal weight and without risk factors for diabetes. Testing should be considered at a younger age or be carried out more frequently if you are overweight and have at least 1 risk factor for diabetes.  Colorectal cancer can be detected and often prevented. Most routine colorectal cancer screening begins at the age of 95 and continues through age 3. However, your health care provider may recommend screening at an earlier age if you have risk factors for colon cancer. On a yearly basis, your health care provider may provide home test kits to check for hidden blood in the stool. Use of a small camera at the end of a tube to directly examine the colon (sigmoidoscopy or colonoscopy) can detect the earliest forms of colorectal cancer. Talk to your health care provider about this at age 44, when routine screening begins. Direct exam of the colon should be repeated every 5-10 years through age 73, unless early forms of precancerous polyps or small growths are found.  People who are at an increased risk for hepatitis B should be screened for this virus. You are considered at high risk for hepatitis B if:  You were born  in a country where hepatitis B occurs often. Talk with your health care provider about which countries are considered high risk.  Your parents were born in a high-risk country and you have not received a shot to protect against hepatitis B (hepatitis B vaccine).  You have HIV or AIDS.  You use needles to inject street drugs.  You live with, or have sex with, someone who has hepatitis B.  You are a man who has sex with other men (MSM).  You get hemodialysis treatment.  You take certain medicines for conditions such as cancer, organ transplantation, and autoimmune conditions.  Hepatitis C blood testing is recommended for all people born from [birth year] through 1965 and any individual with known risks for hepatitis C.  Practice safe sex. Use condoms and avoid high-risk sexual practices to reduce the spread of sexually transmitted infections (STIs). STIs include gonorrhea, chlamydia, syphilis, trichomonas, herpes, HPV, and human immunodeficiency virus (HIV). Herpes, HIV,  and HPV are viral illnesses that have no cure. They can result in disability, cancer, and death.  If you are at risk of being infected with HIV, it is recommended that you take a prescription medicine daily to prevent HIV infection. This is called preexposure prophylaxis (PrEP). You are considered at risk if:  You are a man who has sex with other men (MSM) and have other risk factors.  You are a heterosexual man, are sexually active, and are at increased risk for HIV infection.  You take drugs by injection.  You are sexually active with a partner who has HIV.  Talk with your health care provider about whether you are at high risk of being infected with HIV. If you choose to begin PrEP, you should first be tested for HIV. You should then be tested every 3 months for as long as you are taking PrEP.  A one-time screening for abdominal aortic aneurysm (AAA) and surgical repair of large AAAs by ultrasound are recommended for men  ages 41 to 36 years who are current or former smokers.  Healthy men should no longer receive prostate-specific antigen (PSA) blood tests as part of routine cancer screening. Talk with your health care provider about prostate cancer screening.  Testicular cancer screening is not recommended for adult males who have no symptoms. Screening includes self-exam, a health care provider exam, and other screening tests. Consult with your health care provider about any symptoms you have or any concerns you have about testicular cancer.  Use sunscreen. Apply sunscreen liberally and repeatedly throughout the day. You should seek shade when your shadow is shorter than you. Protect yourself by wearing long sleeves, pants, a wide-brimmed hat, and sunglasses year round, whenever you are outdoors.  Once a month, do a whole-body skin exam, using a mirror to look at the skin on your back. Tell your health care provider about new moles, moles that have irregular borders, moles that are larger than a pencil eraser, or moles that have changed in shape or color.  Stay current with required vaccines (immunizations).  Influenza vaccine. All adults should be immunized every year.  Tetanus, diphtheria, and acellular pertussis (Td, Tdap) vaccine. An adult who has not previously received Tdap or who does not know his vaccine status should receive 1 dose of Tdap. This initial dose should be followed by tetanus and diphtheria toxoids (Td) booster doses every 10 years. Adults with an unknown or incomplete history of completing a 3-dose immunization series with Td-containing vaccines should begin or complete a primary immunization series including a Tdap dose. Adults should receive a Td booster every 10 years.  Varicella vaccine. An adult without evidence of immunity to varicella should receive 2 doses or a second dose if he has previously received 1 dose.  Human papillomavirus (HPV) vaccine. Males aged 66-21 years who have not  received the vaccine previously should receive the 3-dose series. Males aged 22-26 years may be immunized. Immunization is recommended through the age of 89 years for any male who has sex with males and did not get any or all doses earlier. Immunization is recommended for any person with an immunocompromised condition through the age of 54 years if he did not get any or all doses earlier. During the 3-dose series, the second dose should be obtained 4-8 weeks after the first dose. The third dose should be obtained 24 weeks after the first dose and 16 weeks after the second dose.  Zoster vaccine. One dose is recommended  for adults aged 80 years or older unless certain conditions are present.  Measles, mumps, and rubella (MMR) vaccine. Adults born before 45 generally are considered immune to measles and mumps. Adults born in [birth year] or later should have 1 or more doses of MMR vaccine unless there is a contraindication to the vaccine or there is laboratory evidence of immunity to each of the three diseases. A routine second dose of MMR vaccine should be obtained at least 28 days after the first dose for students attending postsecondary schools, health care workers, or international travelers. People who received inactivated measles vaccine or an unknown type of measles vaccine during 1963-1967 should receive 2 doses of MMR vaccine. People who received inactivated mumps vaccine or an unknown type of mumps vaccine before 1979 and are at high risk for mumps infection should consider immunization with 2 doses of MMR vaccine. Unvaccinated health care workers born before [birth year] who lack laboratory evidence of measles, mumps, or rubella immunity or laboratory confirmation of disease should consider measles and mumps immunization with 2 doses of MMR vaccine or rubella immunization with 1 dose of MMR vaccine.  Pneumococcal 13-valent conjugate (PCV13) vaccine. When indicated, a person who is uncertain of his immunization  history and has no record of immunization should receive the PCV13 vaccine. An adult aged 62 years or older who has certain medical conditions and has not been previously immunized should receive 1 dose of PCV13 vaccine. This PCV13 should be followed with a dose of pneumococcal polysaccharide (PPSV23) vaccine. The PPSV23 vaccine dose should be obtained at least 8 weeks after the dose of PCV13 vaccine. An adult aged 1 years or older who has certain medical conditions and previously received 1 or more doses of PPSV23 vaccine should receive 1 dose of PCV13. The PCV13 vaccine dose should be obtained 1 or more years after the last PPSV23 vaccine dose.  Pneumococcal polysaccharide (PPSV23) vaccine. When PCV13 is also indicated, PCV13 should be obtained first. All adults aged 18 years and older should be immunized. An adult younger than age 33 years who has certain medical conditions should be immunized. Any person who resides in a nursing home or long-term care facility should be immunized. An adult smoker should be immunized. People with an immunocompromised condition and certain other conditions should receive both PCV13 and PPSV23 vaccines. People with human immunodeficiency virus (HIV) infection should be immunized as soon as possible after diagnosis. Immunization during chemotherapy or radiation therapy should be avoided. Routine use of PPSV23 vaccine is not recommended for American Indians, [place] Natives, or people younger than 65 years unless there are medical conditions that require PPSV23 vaccine. When indicated, people who have unknown immunization and have no record of immunization should receive PPSV23 vaccine. One-time revaccination 5 years after the first dose of PPSV23 is recommended for people aged 19-64 years who have chronic kidney failure, nephrotic syndrome, asplenia, or immunocompromised conditions. People who received 1-2 doses of PPSV23 before age 61 years should receive another dose of PPSV23  vaccine at age 53 years or later if at least 5 years have passed since the previous dose. Doses of PPSV23 are not needed for people immunized with PPSV23 at or after age 57 years.  Meningococcal vaccine. Adults with asplenia or persistent complement component deficiencies should receive 2 doses of quadrivalent meningococcal conjugate (MenACWY-D) vaccine. The doses should be obtained at least 2 months apart. Microbiologists working with certain meningococcal bacteria, [employer] recruits, people at risk during an outbreak, and people who travel to  or live in countries with a high rate of meningitis should be immunized. A first-year college student up through age 12 years who is living in a residence hall should receive a dose if he did not receive a dose on or after his 16th birthday. Adults who have certain high-risk conditions should receive one or more doses of vaccine.  Hepatitis A vaccine. Adults who wish to be protected from this disease, have certain high-risk conditions, work with hepatitis A-infected animals, work in hepatitis A research labs, or travel to or work in countries with a high rate of hepatitis A should be immunized. Adults who were previously unvaccinated and who anticipate close contact with an international adoptee during the first 60 days after arrival in the [place] from a country with a high rate of hepatitis A should be immunized.  Hepatitis B vaccine. Adults should be immunized if they wish to be protected from this disease, have certain high-risk conditions, may be exposed to blood or other infectious body fluids, are household contacts or sex partners of hepatitis B positive people, are clients or workers in certain care facilities, or travel to or work in countries with a high rate of hepatitis B.  Haemophilus influenzae type b (Hib) vaccine. A previously unvaccinated person with asplenia or sickle cell disease or having a scheduled splenectomy should receive 1 dose of Hib  vaccine. Regardless of previous immunization, a recipient of a hematopoietic stem cell transplant should receive a 3-dose series 6-12 months after his successful transplant. Hib vaccine is not recommended for adults with HIV infection. Preventive Service / Frequency Ages 58 to 79  Blood pressure check.** / Every 1 to 2 years.  Lipid and cholesterol check.** / Every 5 years beginning at age 65.  Hepatitis C blood test.** / For any individual with known risks for hepatitis C.  Skin self-exam. / Monthly.  Influenza vaccine. / Every year.  Tetanus, diphtheria, and acellular pertussis (Tdap, Td) vaccine.** / Consult your health care provider. 1 dose of Td every 10 years.  Varicella vaccine.** / Consult your health care provider.  HPV vaccine. / 3 doses over 6 months, if 108 or younger.  Measles, mumps, rubella (MMR) vaccine.** / You need at least 1 dose of MMR if you were born in 1957 or later. You may also need a second dose.  Pneumococcal 13-valent conjugate (PCV13) vaccine.** / Consult your health care provider.  Pneumococcal polysaccharide (PPSV23) vaccine.** / 1 to 2 doses if you smoke cigarettes or if you have certain conditions.  Meningococcal vaccine.** / 1 dose if you are age 57 to 95 years and a Market researcher living in a residence hall, or have one of several medical conditions. You may also need additional booster doses.  Hepatitis A vaccine.** / Consult your health care provider.  Hepatitis B vaccine.** / Consult your health care provider.  Haemophilus influenzae type b (Hib) vaccine.** / Consult your health care provider. Ages 57 to 70  Blood pressure check.** / Every 1 to 2 years.  Lipid and cholesterol check.** / Every 5 years beginning at age 87.  Lung cancer screening. / Every year if you are aged 48-80 years and have a 30-pack-year history of smoking and currently smoke or have quit within the past 15 years. Yearly screening is stopped once you have  quit smoking for at least 15 years or develop a health problem that would prevent you from having lung cancer treatment.  Fecal occult blood test (FOBT) of stool. /  Every year beginning at age 66 and continuing until age 35. You may not have to do this test if you get a colonoscopy every 10 years.  Flexible sigmoidoscopy** or colonoscopy.** / Every 5 years for a flexible sigmoidoscopy or every 10 years for a colonoscopy beginning at age 74 and continuing until age 76.  Hepatitis C blood test.** / For all people born from [birth year] through 1965 and any individual with known risks for hepatitis C.  Skin self-exam. / Monthly.  Influenza vaccine. / Every year.  Tetanus, diphtheria, and acellular pertussis (Tdap/Td) vaccine.** / Consult your health care provider. 1 dose of Td every 10 years.  Varicella vaccine.** / Consult your health care provider.  Zoster vaccine.** / 1 dose for adults aged 7 years or older.  Measles, mumps, rubella (MMR) vaccine.** / You need at least 1 dose of MMR if you were born in 1955 or later. You may also need a second dose.  Pneumococcal 13-valent conjugate (PCV13) vaccine.** / Consult your health care provider.  Pneumococcal polysaccharide (PPSV23) vaccine.** / 1 to 2 doses if you smoke cigarettes or if you have certain conditions.  Meningococcal vaccine.** / Consult your health care provider.  Hepatitis A vaccine.** / Consult your health care provider.  Hepatitis B vaccine.** / Consult your health care provider.  Haemophilus influenzae type b (Hib) vaccine.** / Consult your health care provider. Ages 42 and over  Blood pressure check.** / Every 1 to 2 years.  Lipid and cholesterol check.**/ Every 5 years beginning at age 27.  Lung cancer screening. / Every year if you are aged 31-80 years and have a 30-pack-year history of smoking and currently smoke or have quit within the past 15 years. Yearly screening is stopped once you have quit smoking for at least 15  years or develop a health problem that would prevent you from having lung cancer treatment.  Fecal occult blood test (FOBT) of stool. / Every year beginning at age 72 and continuing until age 73. You may not have to do this test if you get a colonoscopy every 10 years.  Flexible sigmoidoscopy** or colonoscopy.** / Every 5 years for a flexible sigmoidoscopy or every 10 years for a colonoscopy beginning at age 68 and continuing until age 71.  Hepatitis C blood test.** / For all people born from [birth year] through 1963 and any individual with known risks for hepatitis C.  Abdominal aortic aneurysm (AAA) screening.** / A one-time screening for ages 31 to 30 years who are current or former smokers.  Skin self-exam. / Monthly.  Influenza vaccine. / Every year.  Tetanus, diphtheria, and acellular pertussis (Tdap/Td) vaccine.** / 1 dose of Td every 10 years.  Varicella vaccine.** / Consult your health care provider.  Zoster vaccine.** / 1 dose for adults aged 78 years or older.  Pneumococcal 13-valent conjugate (PCV13) vaccine.** / Consult your health care provider.  Pneumococcal polysaccharide (PPSV23) vaccine.** / 1 dose for all adults aged 69 years and older.  Meningococcal vaccine.** / Consult your health care provider.  Hepatitis A vaccine.** / Consult your health care provider.  Hepatitis B vaccine.** / Consult your health care provider.  Haemophilus influenzae type b (Hib) vaccine.** / Consult your health care provider. **Family history and personal history of risk and conditions may change your health care provider's recommendations. Document Released: 12/19/2001 Document Revised: 10/28/2013 Document Reviewed: 03/20/2011 [facility] Patient Information 2015 [place], [place]. This information is not intended to replace advice given to you by your health care provider. Make  sure you discuss any questions you have with your health care provider.

## 2014-10-06 NOTE — Assessment & Plan Note (Signed)
Risks versus benefits of DRE and PSA discussed with patient.  Defers DRE.  Will obtain PSA today.

## 2014-10-06 NOTE — Assessment & Plan Note (Signed)
I have reviewed the patient's medical history in detail and updated the computerized patient record. Patient will schedule Colonoscopy. Up-to-date of Tetanus.  Defers flu shot.  PHQ-9 Depression screen negative.  Will obtain fasting labs today.

## 2014-10-06 NOTE — Assessment & Plan Note (Signed)
Mild. No evidence of hernia on examiation.  GU exam within normal limits. Suspect MSK etiology.  Encouraged supportive underwear as patient's wears boxers.  NSAIDs if needed.  If no improvement, return for reassessment and imaging.

## 2014-10-06 NOTE — Assessment & Plan Note (Signed)
Well-controlled.  Continue current regimen.  Medications refilled.

## 2014-10-06 NOTE — Assessment & Plan Note (Signed)
Followed by Cardiology [name]). Stable.  Asymptomatic.  Follow-up with Cardiology as directed.

## 2014-11-15 ENCOUNTER — Encounter: Payer: Self-pay | Admitting: Physician Assistant

## 2014-11-16 MED ORDER — FLUTICASONE PROPIONATE 50 MCG/ACT NA SUSP: 2.0000 | Freq: Every day | NASAL | Status: DC

## 2014-11-16 NOTE — Telephone Encounter (Signed)
Rx request escript to pharmacy/[facility]

## 2014-12-19 ENCOUNTER — Encounter: Payer: Self-pay | Admitting: Cardiovascular Disease

## 2015-01-01 ENCOUNTER — Encounter: Payer: Self-pay | Admitting: Cardiovascular Disease

## 2015-01-04 ENCOUNTER — Telehealth: Payer: Self-pay | Admitting: *Deleted

## 2015-01-04 NOTE — Telephone Encounter (Addendum)
Looks like this patient is due for a 6 month follow. Can you schedule him an appointment?         Thank you,    Ewell Poe RN    ----- Message -----     From: David Cunningham     Sent: 01/01/2015  3:14 PM      To: [address] Triage    Subject: Non-Urgent Medical Question                 Sent a email message on 12/19/14 but have received no reply as of yet. My questions are:    Received a notice of need for an appointment with Dr. [name] for followup appointment for Feb 2016. I thought based on my last visit that this appointment would be later in year. Can you please verify.    Also received a notice that due for an Echocardiogram March 2016. Again, need verification if this is correct as thought this was needed later in the year. Thank you          LM  TO  CALL BACK   RE  NEEDING  APPT   TO  SEE  DR  AND   DUE  FOR  ECHO

## 2015-01-12 NOTE — Telephone Encounter (Signed)
APPTS  HAVE  BEEN  SCHEDULED  .David Cunningham

## 2015-01-15 ENCOUNTER — Other Ambulatory Visit: Payer: Self-pay | Admitting: *Deleted

## 2015-01-15 DIAGNOSIS — Q231 Congenital insufficiency of aortic valve: Secondary | ICD-10-CM

## 2015-02-11 NOTE — Progress Notes (Signed)
Patient ID: David Cunningham, male   DOB: 1956/04/15, 59 y.o.   MRN: [id number]   59 y.o. referred by Dr Raliegh Ip for bicuspid AV and abnormal echo. He has known about this for years but not been followed closely. He indicates his valve didn't close well but didn't understand issues with bicuspid valve. He travels a lot for work and had an episode of diaphoresis and lightheadedness. Lasted a few minutes. No chest pain. Reviewed echo with patient Mild LVH 12 mm and normal EF with Moderate AR mild AS bicuspid valve with well compensated LV. Poor visualization of ascending aortic root. No family history of congenital disease. No previous history of CAD. Lightheadness has not recurred and was not postural in nature.  Diet has excess salt, drinks scotch most nights and smoking Discussed relationship to high BP. Counseled for less than 10 minutes and has some motivation to quit Has script for Chantix by Dr Sherren Mocha  ETT 10/09/12 normal with HTN response   MRI 07/16/14 Reviewed AR appeared worse   IMPRESSION: 1) Bicuspid Aortic Valve with severe appearing AR. Suggest echo correlation  2) Moderate aortic root enlargement 4.2 cm stable compared to study done 12/13  3) Mild LAE  4) EF 72% with mild LVE  5) No delayed enhancement in the LV myocardium   IMPRESSION:  1. Bicuspid aortic valve demonstrating regurgitation. There is associated aneurysmal dilatation of the ascending thoracic aorta, measuring 4.3 cm in greatest diameter. 2. No gross evidence of cardiac chamber enlargement or left ventricular hypertrophy. 3. Probable small hemangioma in the superior spleen measuring 13 mm.   Echo:  06/30/14 Study Conclusions  - Left ventricle: The cavity size was normal. There was mild focal basal hypertrophy of the septum. Systolic function was normal. The estimated ejection fraction was in the range of 55% to 60%. Wall motion was normal; there were no regional wall motion abnormalities. - Aortic  valve: Compaired to prior echo 08/12/2013, the degree of AI appears unchanged. Although by pressure halftime the AI appears only mild, by colorflow doppler there are 2 regurgitant jets that appear to be at least moderate in degree of severity. Moderately calcified annulus. Functionally bicuspid; mildly calcified leaflets. There was mild stenosis. There was moderate regurgitation directed eccentrically in the LVOT and towards the mitral anterior leaflet. - Aorta: Ascending aortic diameter: 43 mm (S). - Ascending aorta: The ascending aorta was mildly dilated. - Mitral valve: There was mild regurgitation. - Pulmonic valve: There was trivial regurgitation.    Doing allright No dyspnea chest pain or syncope Dentition in good shape  Youngest son going to [employer]  ROS: Denies fever, malais, weight loss, blurry vision, decreased visual acuity, cough, sputum, SOB, hemoptysis, pleuritic pain, palpitaitons, heartburn, abdominal pain, melena, lower extremity edema, claudication, or rash.  All other systems reviewed and negative  General: Affect appropriate Healthy:  appears stated age 22: normal Neck supple with no adenopathy JVP normal no bruits no thyromegaly Lungs clear with no wheezing and good diaphragmatic motion Heart:  S1/S2 AR  murmur, no rub, gallop or click PMI normal Abdomen: benighn, BS positve, no tenderness, no AAA no bruit.  No HSM or HJR Distal pulses intact with no bruits No edema Neuro non-focal Skin warm and dry No muscular weakness   Current Outpatient Prescriptions  Medication Sig Dispense Refill  . amoxicillin-clavulanate (AUGMENTIN) 875-125 MG per tablet Take 1 tablet by mouth 2 (two) times daily. 20 tablet 0  . cetirizine (ZYRTEC) 5 MG tablet Take 5 mg by mouth  daily.      . fluticasone (FLONASE) 50 MCG/ACT nasal spray Place 2 sprays into both nostrils daily. 48 g 3  . losartan (COZAAR) 25 MG tablet Take 1 tablet (25 mg total) by mouth daily. 90  tablet 3  . montelukast (SINGULAIR) 10 MG tablet Take 1 tablet (10 mg total) by mouth at bedtime. 90 tablet 1  . naproxen sodium (ANAPROX) 220 MG tablet Take 220 mg by mouth as needed (for arthritis pain).    . Probiotic Product (PROBIOTIC DAILY) CAPS Take by mouth daily.     Current Facility-Administered Medications  Medication Dose Route Frequency Provider Last Rate Last Dose  . 0.9 %  sodium chloride infusion  500 mL Intravenous Continuous Sable Feil, MD        Allergies  Review of patient's allergies indicates no known allergies.  Electrocardiogram:  SR rate 65 LVH  2014  06/25/14   SR rate 64 LVH no change   Assessment and Plan AR:  Echo updated today will read latter.  No symptoms , aortic root ok and EF normal continue losartan  Will get BMET and order CTA carotid and cerebral arteries to screen  For aneurysms associated with bicuspid aortic valve Allergies:  Continue zyrtec consider flonase  F/u primary

## 2015-02-12 ENCOUNTER — Ambulatory Visit (HOSPITAL_COMMUNITY): Payer: Managed Care, Other (non HMO) | Attending: Internal Medicine

## 2015-02-12 ENCOUNTER — Ambulatory Visit (INDEPENDENT_AMBULATORY_CARE_PROVIDER_SITE_OTHER): Payer: Managed Care, Other (non HMO) | Admitting: Cardiovascular Disease

## 2015-02-12 VITALS — BP 142/74 | HR 56 | Ht 69.0 in | Wt 171.2 lb

## 2015-02-12 DIAGNOSIS — Q231 Congenital insufficiency of aortic valve: Secondary | ICD-10-CM | POA: Diagnosis not present

## 2015-02-12 DIAGNOSIS — I359 Nonrheumatic aortic valve disorder, unspecified: Secondary | ICD-10-CM

## 2015-02-12 DIAGNOSIS — Z01812 Encounter for preprocedural laboratory examination: Secondary | ICD-10-CM | POA: Diagnosis not present

## 2015-02-12 DIAGNOSIS — Z139 Encounter for screening, unspecified: Secondary | ICD-10-CM

## 2015-02-12 LAB — BASIC METABOLIC PANEL
BUN: 14 mg/dL (ref 6–23)
CALCIUM: 9.5 mg/dL (ref 8.4–10.5)
CO2: 27 meq/L (ref 19–32)
CREATININE: 0.91 mg/dL (ref 0.40–1.50)
Chloride: 105 mEq/L (ref 96–112)
GFR: 91.34 mL/min (ref >60.00)
GLUCOSE: 87 mg/dL (ref 70–99)
Potassium: 4.8 mEq/L (ref 3.5–5.1)
Sodium: 138 mEq/L (ref 135–145)

## 2015-02-12 NOTE — Addendum Note (Signed)
Addended by: Devra Dopp E on: 02/12/2015 03:06 PM   Modules accepted: Orders

## 2015-02-12 NOTE — Progress Notes (Signed)
2D Echo completed. 02/12/2015 

## 2015-02-12 NOTE — Addendum Note (Signed)
Addended by: Devra Dopp E on: 02/12/2015 03:36 PM   Modules accepted: Orders

## 2015-02-12 NOTE — Patient Instructions (Addendum)
Your physician wants you to follow-up in:   [place] will receive a reminder letter in the mail two months in advance. If you don't receive a letter, please call our office to schedule the follow-up appointment.   Your physician recommends that you continue on your current medications as directed. Please refer to the Current Medication list given to you today.    Your physician recommends that you return for lab work in:  [place]

## 2015-02-16 ENCOUNTER — Other Ambulatory Visit: Payer: Self-pay | Admitting: *Deleted

## 2015-02-16 ENCOUNTER — Telehealth: Payer: Self-pay | Admitting: Cardiovascular Disease

## 2015-02-16 ENCOUNTER — Encounter: Payer: Self-pay | Admitting: Cardiovascular Disease

## 2015-02-16 NOTE — Telephone Encounter (Addendum)
New message      Pt is scheduled to have CT's on 02-19-15.  He has to pay for most of the cost.  What other facility can pt have these test done?  Case # [id number]

## 2015-02-16 NOTE — Telephone Encounter (Signed)
PT  NEEDS TO HAVE TEST  DONE  AT  [facility] [place]  WILL HAVE TO PAY LESS   WILL  FAX ORDER TO FACILITY  FAX NUMBER IS  [id number]./CY

## 2015-02-18 NOTE — Telephone Encounter (Signed)
Called patient's mobile # & left voice mail regarding payment of copay the day of service.  Indicated we do not collect at that time, wait for insurance to process and bill him the balance.  Also let him know they are working on pre-certification and would let him know status of that.

## 2015-02-19 ENCOUNTER — Inpatient Hospital Stay: Admission: RE | Admit: 2015-02-19 | Payer: Managed Care, Other (non HMO) | Source: Ambulatory Visit

## 2015-02-22 ENCOUNTER — Telehealth: Payer: Self-pay | Admitting: *Deleted

## 2015-02-22 NOTE — Telephone Encounter (Signed)
LM TO CALL BACK   RE  HEAD  AND  NECK  CT PER  DR NISHAN  NO  ANEURYSMS./CY

## 2015-02-24 NOTE — Telephone Encounter (Signed)
PT  AWARE OF  CT RESULTS .Adonis Housekeeper

## 2015-03-26 ENCOUNTER — Encounter: Payer: Self-pay | Admitting: Cardiovascular Disease

## 2015-04-09 ENCOUNTER — Encounter: Payer: Self-pay | Admitting: Physician Assistant

## 2015-04-09 ENCOUNTER — Ambulatory Visit (INDEPENDENT_AMBULATORY_CARE_PROVIDER_SITE_OTHER): Payer: Managed Care, Other (non HMO) | Admitting: Physician Assistant

## 2015-04-09 VITALS — BP 146/61 | HR 64 | Temp 98.4°F | Ht 69.0 in | Wt 171.2 lb

## 2015-04-09 DIAGNOSIS — I10 Essential (primary) hypertension: Secondary | ICD-10-CM | POA: Diagnosis not present

## 2015-04-09 DIAGNOSIS — J302 Other seasonal allergic rhinitis: Secondary | ICD-10-CM

## 2015-04-09 MED ORDER — MONTELUKAST SODIUM 10 MG PO TABS: 10.0000 mg | ORAL_TABLET | Freq: Every day | ORAL | Status: DC

## 2015-04-09 MED ORDER — FLUTICASONE PROPIONATE 50 MCG/ACT NA SUSP: 2.0000 | Freq: Every day | NASAL | Status: DC

## 2015-04-09 NOTE — Progress Notes (Signed)
Patient presents to clinic today for 56-month follow-up of hypertension.  Patient endorses taking Losartan daily as directed but has not taken this morning. Patient denies chest pain, palpitations, lightheadedness, dizziness, vision changes or frequent headaches.  Past Medical History  Diagnosis Date  . Allergy   . Arthritis   . Heart murmur   . MITRAL VALVE PROLAPSE   . Bicuspid aortic valve   . TINNITUS, CHRONIC, BILATERAL   . DEGENERATIVE DISC DISEASE, CERVICAL SPINE   . PSA, INCREASED   . History of chicken pox   . Hypertension     Current Outpatient Prescriptions on File Prior to Visit  Medication Sig Dispense Refill  . cetirizine (ZYRTEC) 5 MG tablet Take 5 mg by mouth daily.      Marland Kitchen losartan (COZAAR) 25 MG tablet Take 1 tablet (25 mg total) by mouth daily. 90 tablet 3  . naproxen sodium (ANAPROX) 220 MG tablet Take 220 mg by mouth as needed (for arthritis pain).     Current Facility-Administered Medications on File Prior to Visit  Medication Dose Route Frequency Provider Last Rate Last Dose  . 0.9 %  sodium chloride infusion  500 mL Intravenous Continuous Sable Feil, MD        No Known Allergies  Family History  Problem Relation Age of Onset  . Colon cancer Mother 74    Deceased  . Breast cancer Sister   . Lung cancer Mother   . Liver cancer Mother   . Lymphoma Father 48    Deceased  . Diabetes Father   . Cancer Other     Paternal Grandparents  . Cancer Other     Maternal Grandparents  . Emphysema Paternal Grandfather   . Emphysema Maternal Grandfather   . Cancer Paternal Aunt   . Cancer Maternal Aunt     History   Social History  . Marital Status: Married    Spouse Name: N/A  . Number of Children: N/A  . Years of Education: N/A   Social History Main Topics  . Smoking status: Current Every Day Smoker -- 0.50 packs/day    Types: Cigarettes  . Smokeless tobacco: Never Used  . Alcohol Use: 4.2 oz/week    7 Standard drinks or equivalent per  week  . Drug Use: No  . Sexual Activity:    Partners: Female     Comment: wife   Other Topics Concern  . None   Social History Narrative   Review of Systems - See HPI.  All other ROS are negative.  BP 146/61 mmHg  Pulse 64  Temp(Src) 98.4 F (36.9 C) (Oral)  Ht 5\' 9"  (1.753 m)  Wt 171 lb 3.2 oz (77.656 kg)  BMI 25.27 kg/m2  SpO2 100%  Physical Exam  Constitutional: He is oriented to person, place, and time and well-developed, well-nourished, and in no distress.  HENT:  Head: Normocephalic and atraumatic.  Eyes: Conjunctivae are normal. Pupils are equal, round, and reactive to light.  Neck: Neck supple. No thyromegaly present.  Cardiovascular: Normal rate, regular rhythm, normal heart sounds and intact distal pulses.   Pulmonary/Chest: Effort normal and breath sounds normal. No respiratory distress. He has no wheezes. He has no rales. He exhibits no tenderness.  Neurological: He is alert and oriented to person, place, and time.  Skin: Skin is warm and dry. No rash noted.  Psychiatric: Affect normal.  Vitals reviewed.   Recent Results (from the past 2160 hour(s))  Basic metabolic panel  Status: None   Collection Time: 02/12/15  9:55 AM  Result Value Ref Range   Sodium 138 135 - 145 mEq/L   Potassium 4.8 3.5 - 5.1 mEq/L   Chloride 105 96 - 112 mEq/L   CO2 27 19 - 32 mEq/L   Glucose, Bld 87 70 - 99 mg/dL   BUN 14 6 - 23 mg/dL   Creatinine, Ser 0.91 0.40 - 1.50 mg/dL   Calcium 9.5 8.4 - 10.5 mg/dL   GFR 91.34 >60.00 mL/min    Assessment/Plan: Hypertension Well-controlled overall.  BP slightly elevated this AM as patient has not taken medication yet. Asymptomatic.  Continue current regimen. Discussed importance of always taking medications before follow-ups. Follow-up in 6 months for CPE.

## 2015-04-09 NOTE — Patient Instructions (Signed)
Please continue current medication regimen. Stay active and watch that salt intake.  DASH Eating Plan DASH stands for "Dietary Approaches to Stop Hypertension." The DASH eating plan is a healthy eating plan that has been shown to reduce high blood pressure (hypertension). Additional health benefits may include reducing the risk of type 2 diabetes mellitus, heart disease, and stroke. The DASH eating plan may also help with weight loss. WHAT DO I NEED TO KNOW ABOUT THE DASH EATING PLAN? For the DASH eating plan, you will follow these general guidelines:  Choose foods with a percent daily value for sodium of less than 5% (as listed on the food label).  Use salt-free seasonings or herbs instead of table salt or sea salt.  Check with your health care provider or pharmacist before using salt substitutes.  Eat lower-sodium products, often labeled as "lower sodium" or "no salt added."  Eat fresh foods.  Eat more vegetables, fruits, and low-fat dairy products.  Choose whole grains. Look for the word "whole" as the first word in the ingredient list.  Choose fish and skinless chicken or [place] more often than red meat. Limit fish, poultry, and meat to 6 oz (170 g) each day.  Limit sweets, desserts, sugars, and sugary drinks.  Choose heart-healthy fats.  Limit cheese to 1 oz (28 g) per day.  Eat more home-cooked food and less restaurant, buffet, and fast food.  Limit fried foods.  Cook foods using methods other than frying.  Limit canned vegetables. If you do use them, rinse them well to decrease the sodium.  When eating at a restaurant, ask that your food be prepared with less salt, or no salt if possible. WHAT FOODS CAN I EAT? Seek help from a dietitian for individual calorie needs. Grains Whole grain or whole wheat bread. Brown rice. Whole grain or whole wheat pasta. Quinoa, bulgur, and whole grain cereals. Low-sodium cereals. Corn or whole wheat flour tortillas. Whole grain  cornbread. Whole grain crackers. Low-sodium crackers. Vegetables Fresh or frozen vegetables (raw, steamed, roasted, or grilled). Low-sodium or reduced-sodium tomato and vegetable juices. Low-sodium or reduced-sodium tomato sauce and paste. Low-sodium or reduced-sodium canned vegetables.  Fruits All fresh, canned (in natural juice), or frozen fruits. Meat and Other Protein Products Ground beef (85% or leaner), grass-fed beef, or beef trimmed of fat. Skinless chicken or [place]. Ground chicken or [place]. Pork trimmed of fat. All fish and seafood. Eggs. Dried beans, peas, or lentils. Unsalted nuts and seeds. Unsalted canned beans. Dairy Low-fat dairy products, such as skim or 1% milk, 2% or reduced-fat cheeses, low-fat ricotta or cottage cheese, or plain low-fat yogurt. Low-sodium or reduced-sodium cheeses. Fats and Oils Tub margarines without trans fats. Light or reduced-fat mayonnaise and salad dressings (reduced sodium). Avocado. Safflower, olive, or canola oils. Natural peanut or almond butter. Other Unsalted popcorn and pretzels. The items listed above may not be a complete list of recommended foods or beverages. Contact your dietitian for more options. WHAT FOODS ARE NOT RECOMMENDED? Grains White bread. White pasta. White rice. Refined cornbread. Bagels and croissants. Crackers that contain trans fat. Vegetables Creamed or fried vegetables. Vegetables in a cheese sauce. Regular canned vegetables. Regular canned tomato sauce and paste. Regular tomato and vegetable juices. Fruits Dried fruits. Canned fruit in light or heavy syrup. Fruit juice. Meat and Other Protein Products Fatty cuts of meat. Ribs, chicken wings, bacon, sausage, bologna, salami, chitterlings, fatback, hot dogs, bratwurst, and packaged luncheon meats. Salted nuts and seeds. Canned beans with salt. Dairy Whole  or 2% milk, cream, half-and-half, and cream cheese. Whole-fat or sweetened yogurt. Full-fat cheeses or blue cheese.  Nondairy creamers and whipped toppings. Processed cheese, cheese spreads, or cheese curds. Condiments Onion and garlic salt, seasoned salt, table salt, and sea salt. Canned and packaged gravies. Worcestershire sauce. Tartar sauce. Barbecue sauce. Teriyaki sauce. Soy sauce, including reduced sodium. Steak sauce. Fish sauce. Oyster sauce. Cocktail sauce. Horseradish. Ketchup and mustard. Meat flavorings and tenderizers. Bouillon cubes. Hot sauce. Tabasco sauce. Marinades. Taco seasonings. Relishes. Fats and Oils Butter, stick margarine, lard, shortening, ghee, and bacon fat. Coconut, palm kernel, or palm oils. Regular salad dressings. Other Pickles and olives. Salted popcorn and pretzels. The items listed above may not be a complete list of foods and beverages to avoid. Contact your dietitian for more information. WHERE CAN I FIND MORE INFORMATION? National Heart, Lung, and Blood Institute: travelstabloid.com Document Released: 10/12/2011 Document Revised: 03/09/2014 Document Reviewed: 08/27/2013 [facility] Patient Information 2015 [place], [place]. This information is not intended to replace advice given to you by your health care provider. Make sure you discuss any questions you have with your health care provider.

## 2015-04-09 NOTE — Assessment & Plan Note (Signed)
Well-controlled overall.  BP slightly elevated this AM as patient has not taken medication yet. Asymptomatic.  Continue current regimen. Discussed importance of always taking medications before follow-ups. Follow-up in 6 months for CPE.

## 2015-04-09 NOTE — Progress Notes (Signed)
Pre visit review using our clinic review tool, if applicable. No additional management support is needed unless otherwise documented below in the visit note. 

## 2015-05-18 ENCOUNTER — Ambulatory Visit (INDEPENDENT_AMBULATORY_CARE_PROVIDER_SITE_OTHER): Payer: Managed Care, Other (non HMO) | Admitting: Physician Assistant

## 2015-05-18 ENCOUNTER — Encounter: Payer: Self-pay | Admitting: Physician Assistant

## 2015-05-18 VITALS — BP 155/57 | HR 63 | Temp 98.3°F | Ht 69.0 in | Wt 170.8 lb

## 2015-05-18 DIAGNOSIS — J019 Acute sinusitis, unspecified: Secondary | ICD-10-CM

## 2015-05-18 DIAGNOSIS — B9689 Other specified bacterial agents as the cause of diseases classified elsewhere: Secondary | ICD-10-CM

## 2015-05-18 MED ORDER — AMOXICILLIN-POT CLAVULANATE 875-125 MG PO TABS: 1.0000 | ORAL_TABLET | Freq: Two times a day (BID) | ORAL | Status: DC

## 2015-05-18 NOTE — Patient Instructions (Signed)
Please take antibiotic as directed.  Increase fluid intake.  Use Saline nasal spray.  Take a daily multivitamin. Continue allergy medications.  Place a humidifier in the bedroom.  Please call or return clinic if symptoms are not improving.  Sinusitis Sinusitis is redness, soreness, and swelling (inflammation) of the paranasal sinuses. Paranasal sinuses are air pockets within the bones of your face (beneath the eyes, the middle of the forehead, or above the eyes). In healthy paranasal sinuses, mucus is able to drain out, and air is able to circulate through them by way of your nose. However, when your paranasal sinuses are inflamed, mucus and air can become trapped. This can allow bacteria and other germs to grow and cause infection. Sinusitis can develop quickly and last only a short time (acute) or continue over a long period (chronic). Sinusitis that lasts for more than 12 weeks is considered chronic.  CAUSES  Causes of sinusitis include:  Allergies.  Structural abnormalities, such as displacement of the cartilage that separates your nostrils (deviated septum), which can decrease the air flow through your nose and sinuses and affect sinus drainage.  Functional abnormalities, such as when the small hairs (cilia) that line your sinuses and help remove mucus do not work properly or are not present. SYMPTOMS  Symptoms of acute and chronic sinusitis are the same. The primary symptoms are pain and pressure around the affected sinuses. Other symptoms include:  Upper toothache.  Earache.  Headache.  Bad breath.  Decreased sense of smell and taste.  A cough, which worsens when you are lying flat.  Fatigue.  Fever.  Thick drainage from your nose, which often is green and may contain pus (purulent).  Swelling and warmth over the affected sinuses. DIAGNOSIS  Your caregiver will perform a physical exam. During the exam, your caregiver may:  Look in your nose for signs of abnormal growths  in your nostrils (nasal polyps).  Tap over the affected sinus to check for signs of infection.  View the inside of your sinuses (endoscopy) with a special imaging device with a light attached (endoscope), which is inserted into your sinuses. If your caregiver suspects that you have chronic sinusitis, one or more of the following tests may be recommended:  Allergy tests.  Nasal culture A sample of mucus is taken from your nose and sent to a lab and screened for bacteria.  Nasal cytology A sample of mucus is taken from your nose and examined by your caregiver to determine if your sinusitis is related to an allergy. TREATMENT  Most cases of acute sinusitis are related to a viral infection and will resolve on their own within 10 days. Sometimes medicines are prescribed to help relieve symptoms (pain medicine, decongestants, nasal steroid sprays, or saline sprays).  However, for sinusitis related to a bacterial infection, your caregiver will prescribe antibiotic medicines. These are medicines that will help kill the bacteria causing the infection.  Rarely, sinusitis is caused by a fungal infection. In theses cases, your caregiver will prescribe antifungal medicine. For some cases of chronic sinusitis, surgery is needed. Generally, these are cases in which sinusitis recurs more than 3 times per year, despite other treatments. HOME CARE INSTRUCTIONS   Drink plenty of water. Water helps thin the mucus so your sinuses can drain more easily.  Use a humidifier.  Inhale steam 3 to 4 times a day (for example, sit in the bathroom with the shower running).  Apply a warm, moist washcloth to your face 3 to 4   times a day, or as directed by your caregiver.  Use saline nasal sprays to help moisten and clean your sinuses.  Take over-the-counter or prescription medicines for pain, discomfort, or fever only as directed by your caregiver. SEEK IMMEDIATE MEDICAL CARE IF:  You have increasing pain or severe  headaches.  You have nausea, vomiting, or drowsiness.  You have swelling around your face.  You have vision problems.  You have a stiff neck.  You have difficulty breathing. MAKE SURE YOU:   Understand these instructions.  Will watch your condition.  Will get help right away if you are not doing well or get worse. Document Released: 10/23/2005 Document Revised: 01/15/2012 Document Reviewed: 11/07/2011 [employer] Patient Information 2014 [employer].   

## 2015-05-18 NOTE — Progress Notes (Signed)
Patient presents to clinic today c/o > 1 week of sinus pressure/pain, facial pain, PND, sore throat and tooth pain. Denies fever, chest congestion or shortness of breath. Endorses mild dry cough worse at night. Denies recent foreign travel. Denies sick contact. Is taking allergy medications as directed without much relief.  Past Medical History  Diagnosis Date  . Allergy   . Arthritis   . Heart murmur   . MITRAL VALVE PROLAPSE   . Bicuspid aortic valve   . TINNITUS, CHRONIC, BILATERAL   . DEGENERATIVE DISC DISEASE, CERVICAL SPINE   . PSA, INCREASED   . History of chicken pox   . Hypertension     Current Outpatient Prescriptions on File Prior to Visit  Medication Sig Dispense Refill  . cetirizine (ZYRTEC) 5 MG tablet Take 5 mg by mouth daily.      . fluticasone (FLONASE) 50 MCG/ACT nasal spray Place 2 sprays into both nostrils daily. 48 g 3  . losartan (COZAAR) 25 MG tablet Take 1 tablet (25 mg total) by mouth daily. 90 tablet 3  . montelukast (SINGULAIR) 10 MG tablet Take 1 tablet (10 mg total) by mouth at bedtime. 90 tablet 1  . naproxen sodium (ANAPROX) 220 MG tablet Take 220 mg by mouth as needed (for arthritis pain).     Current Facility-Administered Medications on File Prior to Visit  Medication Dose Route Frequency Provider Last Rate Last Dose  . 0.9 %  sodium chloride infusion  500 mL Intravenous Continuous Sable Feil, MD        No Known Allergies  Family History  Problem Relation Age of Onset  . Colon cancer Mother 23    Deceased  . Breast cancer Sister   . Lung cancer Mother   . Liver cancer Mother   . Lymphoma Father 84    Deceased  . Diabetes Father   . Cancer Other     Paternal Grandparents  . Cancer Other     Maternal Grandparents  . Emphysema Paternal Grandfather   . Emphysema Maternal Grandfather   . Cancer Paternal Aunt   . Cancer Maternal Aunt     History   Social History  . Marital Status: Married    Spouse Name: N/A  . Number of  Children: N/A  . Years of Education: N/A   Social History Main Topics  . Smoking status: Current Every Day Smoker -- 0.50 packs/day    Types: Cigarettes  . Smokeless tobacco: Never Used  . Alcohol Use: 4.2 oz/week    7 Standard drinks or equivalent per week  . Drug Use: No  . Sexual Activity:    Partners: Female     Comment: wife   Other Topics Concern  . None   Social History Narrative   Review of Systems - See HPI.  All other ROS are negative.  BP 155/57 mmHg  Pulse 63  Temp(Src) 98.3 F (36.8 C) (Oral)  Ht 5\' 9"  (1.753 m)  Wt 170 lb 12.8 oz (77.474 kg)  BMI 25.21 kg/m2  SpO2 100%  Physical Exam  Constitutional: He is oriented to person, place, and time and well-developed, well-nourished, and in no distress.  HENT:  Head: Normocephalic and atraumatic.  Right Ear: Tympanic membrane, external ear and ear canal normal.  Left Ear: Tympanic membrane, external ear and ear canal normal.  Nose: Left sinus exhibits maxillary sinus tenderness and frontal sinus tenderness.  Mouth/Throat: Uvula is midline and oropharynx is clear and moist.  Eyes: Conjunctivae are  normal.  Neck: Neck supple.  Cardiovascular: Normal rate, regular rhythm, normal heart sounds and intact distal pulses.   Pulmonary/Chest: Effort normal and breath sounds normal. No respiratory distress. He has no wheezes. He has no rales. He exhibits no tenderness.  Neurological: He is alert and oriented to person, place, and time.  Skin: Skin is warm and dry. No rash noted.  Psychiatric: Affect normal.  Vitals reviewed.   No results found for this or any previous visit (from the past 2160 hour(s)).  Assessment/Plan: Acute bacterial sinusitis Rx Augmentin.  Increase fluids.  Rest.  Saline nasal spray.  Probiotic.  Mucinex as directed.  Humidifier in bedroom. Continue allergy medications.  Call or return to clinic if symptoms are not improving.

## 2015-05-18 NOTE — Progress Notes (Signed)
Pre visit review using our clinic review tool, if applicable. No additional management support is needed unless otherwise documented below in the visit note. 

## 2015-05-18 NOTE — Assessment & Plan Note (Signed)
Rx Augmentin.  Increase fluids.  Rest.  Saline nasal spray.  Probiotic.  Mucinex as directed.  Humidifier in bedroom. Continue allergy medications.  Call or return to clinic if symptoms are not improving.

## 2015-05-31 ENCOUNTER — Encounter: Payer: Self-pay | Admitting: Physician Assistant

## 2015-05-31 ENCOUNTER — Ambulatory Visit (INDEPENDENT_AMBULATORY_CARE_PROVIDER_SITE_OTHER): Payer: Managed Care, Other (non HMO) | Admitting: Physician Assistant

## 2015-05-31 VITALS — BP 130/58 | HR 68 | Temp 98.1°F | Ht 69.0 in | Wt 170.0 lb

## 2015-05-31 DIAGNOSIS — J0101 Acute recurrent maxillary sinusitis: Secondary | ICD-10-CM

## 2015-05-31 MED ORDER — METHYLPREDNISOLONE ACETATE 80 MG/ML IJ SUSP
80.0000 mg | Freq: Once | INTRAMUSCULAR | Status: AC
  Administered 2015-05-31: 80 mg via INTRAMUSCULAR

## 2015-05-31 MED ORDER — DOXYCYCLINE HYCLATE 100 MG PO CAPS: 100.0000 mg | ORAL_CAPSULE | Freq: Two times a day (BID) | ORAL | Status: DC

## 2015-05-31 NOTE — Progress Notes (Signed)
Patient presents to clinic today sinusitis after completing course of. Patient states mostly resolved. Finish last dose of the body. Since that time symptoms have come back. Endorses significant left maxillary facial pain. Endorses sinus drainage, sinus pressure, and sinus pain. Denies fever or chills. Denies chest congestion, shortness of breath or chest pain.  Past Medical History  Diagnosis Date  . Allergy   . Arthritis   . Heart murmur   . MITRAL VALVE PROLAPSE   . Bicuspid aortic valve   . TINNITUS, CHRONIC, BILATERAL   . DEGENERATIVE DISC DISEASE, CERVICAL SPINE   . PSA, INCREASED   . History of chicken pox   . Hypertension     Current Outpatient Prescriptions on File Prior to Visit  Medication Sig Dispense Refill  . cetirizine (ZYRTEC) 5 MG tablet Take 5 mg by mouth daily.      . fluticasone (FLONASE) 50 MCG/ACT nasal spray Place 2 sprays into both nostrils daily. 48 g 3  . losartan (COZAAR) 25 MG tablet Take 1 tablet (25 mg total) by mouth daily. 90 tablet 3  . montelukast (SINGULAIR) 10 MG tablet Take 1 tablet (10 mg total) by mouth at bedtime. 90 tablet 1  . naproxen sodium (ANAPROX) 220 MG tablet Take 220 mg by mouth as needed (for arthritis pain).     Current Facility-Administered Medications on File Prior to Visit  Medication Dose Route Frequency Provider Last Rate Last Dose  . 0.9 %  sodium chloride infusion  500 mL Intravenous Continuous Sable Feil, MD        No Known Allergies  Family History  Problem Relation Age of Onset  . Colon cancer Mother 67    Deceased  . Breast cancer Sister   . Lung cancer Mother   . Liver cancer Mother   . Lymphoma Father 66    Deceased  . Diabetes Father   . Cancer Other     Paternal Grandparents  . Cancer Other     Maternal Grandparents  . Emphysema Paternal Grandfather   . Emphysema Maternal Grandfather   . Cancer Paternal Aunt   . Cancer Maternal Aunt     History   Social History  . Marital Status:  Married    Spouse Name: N/A  . Number of Children: N/A  . Years of Education: N/A   Social History Main Topics  . Smoking status: Current Every Day Smoker -- 0.50 packs/day    Types: Cigarettes  . Smokeless tobacco: Never Used  . Alcohol Use: 4.2 oz/week    7 Standard drinks or equivalent per week  . Drug Use: No  . Sexual Activity:    Partners: Female     Comment: wife   Other Topics Concern  . None   Social History Narrative   Review of Systems - See HPI.  All other ROS are negative.  BP 130/58 mmHg  Pulse 68  Temp(Src) 98.1 F (36.7 C) (Oral)  Ht 5\' 9"  (1.753 m)  Wt 170 lb (77.111 kg)  BMI 25.09 kg/m2  SpO2 98%  Physical Exam  Constitutional: He is oriented to person, place, and time and well-developed, well-nourished, and in no distress.  HENT:  Head: Normocephalic and atraumatic.  Right Ear: Tympanic membrane and ear canal normal.  Left Ear: Tympanic membrane and ear canal normal.  Nose: Left sinus exhibits maxillary sinus tenderness.  Mouth/Throat: Uvula is midline, oropharynx is clear and moist and mucous membranes are normal.  Eyes: Conjunctivae are normal.  Neck: Neck supple.  Cardiovascular: Normal rate, regular rhythm, normal heart sounds and intact distal pulses.   Pulmonary/Chest: Effort normal and breath sounds normal. No respiratory distress. He has no wheezes. He has no rales. He exhibits no tenderness.  Neurological: He is oriented to person, place, and time.  Skin: Skin is warm and dry. No rash noted.  Psychiatric: Affect normal.  Vitals reviewed.  Assessment/Plan: Acute recurrent maxillary sinusitis 80 mg IM Depo-Medrol given. Rx doxycycline. Continue supportive measures discussed last visit. If symptoms are not resolving with this current treatment, will need referral to ENT for assessment.

## 2015-05-31 NOTE — Progress Notes (Signed)
Pre visit review using our clinic review tool, if applicable. No additional management support is needed unless otherwise documented below in the visit note. 

## 2015-05-31 NOTE — Patient Instructions (Signed)
Please take antibiotic as directed.  Increase fluid intake.  Use Saline nasal spray.  Take a daily multivitamin. The steroid shot given today should kick-start your recovery but decreasing inflammation in the sinuses.  Place a humidifier in the bedroom.  Please call or return clinic if symptoms are not improving -- we may need to set you up with an Ear, Nose and Throat specialist.  Sinusitis Sinusitis is redness, soreness, and swelling (inflammation) of the paranasal sinuses. Paranasal sinuses are air pockets within the bones of your face (beneath the eyes, the middle of the forehead, or above the eyes). In healthy paranasal sinuses, mucus is able to drain out, and air is able to circulate through them by way of your nose. However, when your paranasal sinuses are inflamed, mucus and air can become trapped. This can allow bacteria and other germs to grow and cause infection. Sinusitis can develop quickly and last only a short time (acute) or continue over a long period (chronic). Sinusitis that lasts for more than 12 weeks is considered chronic.  CAUSES  Causes of sinusitis include:  Allergies.  Structural abnormalities, such as displacement of the cartilage that separates your nostrils (deviated septum), which can decrease the air flow through your nose and sinuses and affect sinus drainage.  Functional abnormalities, such as when the small hairs (cilia) that line your sinuses and help remove mucus do not work properly or are not present. SYMPTOMS  Symptoms of acute and chronic sinusitis are the same. The primary symptoms are pain and pressure around the affected sinuses. Other symptoms include:  Upper toothache.  Earache.  Headache.  Bad breath.  Decreased sense of smell and taste.  A cough, which worsens when you are lying flat.  Fatigue.  Fever.  Thick drainage from your nose, which often is green and may contain pus (purulent).  Swelling and warmth over the affected  sinuses. DIAGNOSIS  Your caregiver will perform a physical exam. During the exam, your caregiver may:  Look in your nose for signs of abnormal growths in your nostrils (nasal polyps).  Tap over the affected sinus to check for signs of infection.  View the inside of your sinuses (endoscopy) with a special imaging device with a light attached (endoscope), which is inserted into your sinuses. If your caregiver suspects that you have chronic sinusitis, one or more of the following tests may be recommended:  Allergy tests.  Nasal culture A sample of mucus is taken from your nose and sent to a lab and screened for bacteria.  Nasal cytology A sample of mucus is taken from your nose and examined by your caregiver to determine if your sinusitis is related to an allergy. TREATMENT  Most cases of acute sinusitis are related to a viral infection and will resolve on their own within 10 days. Sometimes medicines are prescribed to help relieve symptoms (pain medicine, decongestants, nasal steroid sprays, or saline sprays).  However, for sinusitis related to a bacterial infection, your caregiver will prescribe antibiotic medicines. These are medicines that will help kill the bacteria causing the infection.  Rarely, sinusitis is caused by a fungal infection. In theses cases, your caregiver will prescribe antifungal medicine. For some cases of chronic sinusitis, surgery is needed. Generally, these are cases in which sinusitis recurs more than 3 times per year, despite other treatments. HOME CARE INSTRUCTIONS   Drink plenty of water. Water helps thin the mucus so your sinuses can drain more easily.  Use a humidifier.  Inhale steam 3  to 4 times a day (for example, sit in the bathroom with the shower running).  Apply a warm, moist washcloth to your face 3 to 4 times a day, or as directed by your caregiver.  Use saline nasal sprays to help moisten and clean your sinuses.  Take over-the-counter or  prescription medicines for pain, discomfort, or fever only as directed by your caregiver. SEEK IMMEDIATE MEDICAL CARE IF:  You have increasing pain or severe headaches.  You have nausea, vomiting, or drowsiness.  You have swelling around your face.  You have vision problems.  You have a stiff neck.  You have difficulty breathing. MAKE SURE YOU:   Understand these instructions.  Will watch your condition.  Will get help right away if you are not doing well or get worse. Document Released: 10/23/2005 Document Revised: 01/15/2012 Document Reviewed: 11/07/2011 [facility] Patient Information 2014 [place], [place].

## 2015-05-31 NOTE — Assessment & Plan Note (Signed)
80 mg IM Depo-Medrol given. Rx doxycycline. Continue supportive measures discussed last visit. If symptoms are not resolving with this current treatment, will need referral to ENT for assessment.

## 2015-06-25 ENCOUNTER — Encounter: Payer: Self-pay | Admitting: Gastroenterology

## 2015-07-26 ENCOUNTER — Telehealth: Payer: Self-pay | Admitting: Cardiovascular Disease

## 2015-07-26 NOTE — Telephone Encounter (Signed)
New Message  Pt messaged the scheduling pool w/ following message:         I checked on line today at [facility] and my prescription refill does not show on their system. Can you tell me if someone from Dr. Kyla Balzarine office has called it in yet? Thank you.   The pt has an appt set for Sep 08, 2015. Please call back and discuss.

## 2015-07-27 ENCOUNTER — Other Ambulatory Visit: Payer: Self-pay | Admitting: *Deleted

## 2015-07-27 MED ORDER — LOSARTAN POTASSIUM 25 MG PO TABS: 25.0000 mg | ORAL_TABLET | Freq: Every day | ORAL | Status: DC

## 2015-09-06 NOTE — Progress Notes (Signed)
Patient ID: David Cunningham, male   DOB: 03/30/1956, 59 y.o.   MRN: [id number]   59 y.o. referred by Dr Raliegh Ip for bicuspid AV and abnormal echo. He has known about this for years but not been followed closely. He indicates his valve didn't close well but didn't understand issues with bicuspid valve. He travels a lot for work and had an episode of diaphoresis and lightheadedness. Lasted a few minutes. No chest pain. Reviewed echo with patient Mild LVH 12 mm and normal EF with Moderate AR mild AS bicuspid valve with well compensated LV. Poor visualization of ascending aortic root. No family history of congenital disease. No previous history of CAD. Lightheadness has not recurred and was not postural in nature.  Diet has excess salt, drinks scotch most nights and smoking Discussed relationship to high BP. Counseled for less than 10 minutes and has some motivation to quit Has script for Chantix by Dr Sherren Mocha   ETT 10/09/12 normal with HTN response   MRI/MRA:  07/2014  IMPRESSION: 1) Bicuspid Aortic Valve with severe appearing AR. Suggest echo correlation  2) Moderate aortic root enlargement 4.2 cm stable compared to study done 12/13  3) Mild LAE  4) EF 72% with mild LVE  5) No delayed enhancement in the LV myocardium  Echo 02/12/15 normal EF mild AS/AR Study Conclusions  - Left ventricle: The cavity size was mildly dilated. Wall thickness was normal. Systolic function was normal. The estimated ejection fraction was in the range of 60% to 65%. - Aortic valve: AV is thickened with veryl mildly restricted motion. Peak and mean gradients through the valve are 24 and 13 mm Hg respectively. There was mild regurgitation. - Mitral valve: There was mild regurgitation.  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  All other systems reviewed and negative  General: Affect appropriate Healthy:  appears stated age 62: normal Neck supple with no adenopathy JVP normal no bruits no thyromegaly Lungs clear with no wheezing and good diaphragmatic motion Heart:  S1/S2 AR  murmur, no rub, gallop or click PMI normal Abdomen: benighn, BS positve, no tenderness, no AAA no bruit.  No HSM or HJR Distal pulses intact with no bruits No edema Neuro non-focal Skin warm and dry No muscular weakness   Current Outpatient Prescriptions  Medication Sig Dispense Refill  . cetirizine (ZYRTEC) 5 MG tablet Take 5 mg by mouth daily.      . fluticasone (FLONASE) 50 MCG/ACT nasal spray Place 2 sprays into both nostrils daily. 48 g 3  . losartan (COZAAR) 25 MG tablet Take 1 tablet (25 mg total) by mouth daily. 90 tablet 3  . montelukast (SINGULAIR) 10 MG tablet Take 1 tablet (10 mg total) by mouth at bedtime. 90 tablet 1  . naproxen sodium (ANAPROX) 220 MG tablet Take 220 mg by mouth as needed (for arthritis pain).     Current Facility-Administered Medications  Medication Dose Route Frequency Provider Last Rate Last Dose  . 0.9 %  sodium chloride infusion  500 mL Intravenous Continuous Sable Feil, MD        Allergies  Review of patient's allergies indicates no known allergies.  Electrocardiogram:  SR rate 65 LVH  2014  06/25/14   SR rate 64 LVH no change  09/08/15  SB rate 56 normal   Assessment and Plan AR:  Mild  EF normal continue losartan  Root dimension stable head CT negative for aneurysm Allergies:  Continue zyrtec consider flonase  F/u primary   Will have f/u MRI and echo in April and see me then   Jenkins Rouge

## 2015-09-08 ENCOUNTER — Ambulatory Visit (INDEPENDENT_AMBULATORY_CARE_PROVIDER_SITE_OTHER): Payer: Managed Care, Other (non HMO) | Admitting: Cardiovascular Disease

## 2015-09-08 ENCOUNTER — Encounter: Payer: Self-pay | Admitting: Cardiovascular Disease

## 2015-09-08 VITALS — BP 142/60 | HR 56 | Ht 69.0 in | Wt 170.8 lb

## 2015-09-08 DIAGNOSIS — I359 Nonrheumatic aortic valve disorder, unspecified: Secondary | ICD-10-CM | POA: Diagnosis not present

## 2015-09-08 MED ORDER — LOSARTAN POTASSIUM 25 MG PO TABS: 25.0000 mg | ORAL_TABLET | Freq: Every day | ORAL | Status: DC

## 2015-09-08 NOTE — Patient Instructions (Addendum)
Medication Instructions:  Your physician recommends that you continue on your current medications as directed. Please refer to the Current Medication list given to you today.   Labwork: NONE  Testing/Procedures: Your physician has requested that you have an echocardiogram. Echocardiography is a painless test that uses sound waves to create images of your heart. It provides your doctor with information about the size and shape of your heart and how well your heart's chambers and valves are working. This procedure takes approximately one hour. There are no restrictions for this procedure. DUE IN  April Your physician has requested that you have a cardiac MRI. Cardiac MRI uses a computer to create images of your heart as its beating, producing both still and moving pictures of your heart and major blood vessels. For further information please visit [email or web address] Please follow the instruction sheet given to you today for more information. DUE IN  April Follow-Up: Your physician wants you to follow-up in: [place] will receive a reminder letter in the mail two months in advance. If you don't receive a letter, please call our office to schedule the follow-up appointment.   Any Other Special Instructions Will Be Listed Below (If Applicable).     If you need a refill on your cardiac medications before your next appointment, please call your pharmacy.

## 2015-09-10 ENCOUNTER — Encounter: Payer: Self-pay | Admitting: Physician Assistant

## 2015-09-10 ENCOUNTER — Ambulatory Visit (INDEPENDENT_AMBULATORY_CARE_PROVIDER_SITE_OTHER): Payer: Managed Care, Other (non HMO) | Admitting: Physician Assistant

## 2015-09-10 VITALS — BP 142/57 | HR 59 | Temp 98.2°F | Resp 16 | Ht 69.0 in | Wt 171.5 lb

## 2015-09-10 DIAGNOSIS — J019 Acute sinusitis, unspecified: Secondary | ICD-10-CM | POA: Insufficient documentation

## 2015-09-10 DIAGNOSIS — B9689 Other specified bacterial agents as the cause of diseases classified elsewhere: Secondary | ICD-10-CM

## 2015-09-10 MED ORDER — AZITHROMYCIN 250 MG PO TABS: ORAL_TABLET | ORAL | Status: DC

## 2015-09-10 NOTE — Progress Notes (Signed)
Pre visit review using our clinic review tool, if applicable. No additional management support is needed unless otherwise documented below in the visit note/SLS  

## 2015-09-10 NOTE — Progress Notes (Signed)
Patient presents to clinic today c/o 5 days of rapidly worsening sinus pressure, sinus pain, tooth pain and fatigue. Has history of allergies and sinusitis. Denies fever, chills, chest congestion, recent travel or sick contact.  Past Medical History  Diagnosis Date  . Allergy   . Arthritis   . Heart murmur   . MITRAL VALVE PROLAPSE   . Bicuspid aortic valve   . TINNITUS, CHRONIC, BILATERAL   . DEGENERATIVE DISC DISEASE, CERVICAL SPINE   . PSA, INCREASED   . History of chicken pox   . Hypertension     Current Outpatient Prescriptions on File Prior to Visit  Medication Sig Dispense Refill  . cetirizine (ZYRTEC) 5 MG tablet Take 5 mg by mouth daily.      . fluticasone (FLONASE) 50 MCG/ACT nasal spray Place 2 sprays into both nostrils daily. 48 g 3  . losartan (COZAAR) 25 MG tablet Take 1 tablet (25 mg total) by mouth daily. 90 tablet 3  . montelukast (SINGULAIR) 10 MG tablet Take 1 tablet (10 mg total) by mouth at bedtime. 90 tablet 1  . naproxen sodium (ANAPROX) 220 MG tablet Take 220 mg by mouth as needed (for arthritis pain).     Current Facility-Administered Medications on File Prior to Visit  Medication Dose Route Frequency Provider Last Rate Last Dose  . 0.9 %  sodium chloride infusion  500 mL Intravenous Continuous Sable Feil, MD        No Known Allergies  Family History  Problem Relation Age of Onset  . Colon cancer Mother 37    Deceased  . Breast cancer Sister   . Lung cancer Mother   . Liver cancer Mother   . Lymphoma Father 32    Deceased  . Diabetes Father   . Cancer Other     Paternal Grandparents  . Cancer Other     Maternal Grandparents  . Emphysema Paternal Grandfather   . Emphysema Maternal Grandfather   . Cancer Paternal Aunt   . Cancer Maternal Aunt     Social History   Social History  . Marital Status: Married    Spouse Name: N/A  . Number of Children: N/A  . Years of Education: N/A   Social History Main Topics  . Smoking status:  Current Every Day Smoker -- 0.50 packs/day    Types: Cigarettes  . Smokeless tobacco: Never Used  . Alcohol Use: 4.2 oz/week    7 Standard drinks or equivalent per week  . Drug Use: No  . Sexual Activity:    Partners: Female     Comment: wife   Other Topics Concern  . None   Social History Narrative   Review of Systems - See HPI.  All other ROS are negative.  BP 142/57 mmHg  Pulse 59  Temp(Src) 98.2 F (36.8 C) (Oral)  Resp 16  Ht 5\' 9"  (1.753 m)  Wt 171 lb 8 oz (77.792 kg)  BMI 25.31 kg/m2  SpO2 100%  Physical Exam  Constitutional: He is oriented to person, place, and time and well-developed, well-nourished, and in no distress.  HENT:  Head: Normocephalic and atraumatic.  + TTP of sinuses. TM within normal limits on exam.  Eyes: Conjunctivae are normal.  Neck: Neck supple.  Cardiovascular: Normal rate, regular rhythm, normal heart sounds and intact distal pulses.   Pulmonary/Chest: Effort normal and breath sounds normal. No respiratory distress. He has no wheezes. He has no rales. He exhibits no tenderness.  Neurological: He is  alert and oriented to person, place, and time.  Skin: Skin is warm and dry. No rash noted.  Psychiatric: Affect normal.  Vitals reviewed.   Assessment/Plan: Acute bacterial sinusitis Rx Azithromycin.  Increase fluids.  Rest.  Saline nasal spray.  Probiotic.  Mucinex as directed.  Humidifier in bedroom. Continue allergy medications.  Call or return to clinic if symptoms are not improving.

## 2015-09-10 NOTE — Patient Instructions (Signed)
Please take antibiotic as directed.  Increase fluid intake.  Use Saline nasal spray.  Take a daily multivitamin. Continue allergy medications.  Place a humidifier in the bedroom.  Please call or return clinic if symptoms are not improving.  Sinusitis Sinusitis is redness, soreness, and swelling (inflammation) of the paranasal sinuses. Paranasal sinuses are air pockets within the bones of your face (beneath the eyes, the middle of the forehead, or above the eyes). In healthy paranasal sinuses, mucus is able to drain out, and air is able to circulate through them by way of your nose. However, when your paranasal sinuses are inflamed, mucus and air can become trapped. This can allow bacteria and other germs to grow and cause infection. Sinusitis can develop quickly and last only a short time (acute) or continue over a long period (chronic). Sinusitis that lasts for more than 12 weeks is considered chronic.  CAUSES  Causes of sinusitis include:  Allergies.  Structural abnormalities, such as displacement of the cartilage that separates your nostrils (deviated septum), which can decrease the air flow through your nose and sinuses and affect sinus drainage.  Functional abnormalities, such as when the small hairs (cilia) that line your sinuses and help remove mucus do not work properly or are not present. SYMPTOMS  Symptoms of acute and chronic sinusitis are the same. The primary symptoms are pain and pressure around the affected sinuses. Other symptoms include:  Upper toothache.  Earache.  Headache.  Bad breath.  Decreased sense of smell and taste.  A cough, which worsens when you are lying flat.  Fatigue.  Fever.  Thick drainage from your nose, which often is green and may contain pus (purulent).  Swelling and warmth over the affected sinuses. DIAGNOSIS  Your caregiver will perform a physical exam. During the exam, your caregiver may:  Look in your nose for signs of abnormal growths  in your nostrils (nasal polyps).  Tap over the affected sinus to check for signs of infection.  View the inside of your sinuses (endoscopy) with a special imaging device with a light attached (endoscope), which is inserted into your sinuses. If your caregiver suspects that you have chronic sinusitis, one or more of the following tests may be recommended:  Allergy tests.  Nasal culture A sample of mucus is taken from your nose and sent to a lab and screened for bacteria.  Nasal cytology A sample of mucus is taken from your nose and examined by your caregiver to determine if your sinusitis is related to an allergy. TREATMENT  Most cases of acute sinusitis are related to a viral infection and will resolve on their own within 10 days. Sometimes medicines are prescribed to help relieve symptoms (pain medicine, decongestants, nasal steroid sprays, or saline sprays).  However, for sinusitis related to a bacterial infection, your caregiver will prescribe antibiotic medicines. These are medicines that will help kill the bacteria causing the infection.  Rarely, sinusitis is caused by a fungal infection. In theses cases, your caregiver will prescribe antifungal medicine. For some cases of chronic sinusitis, surgery is needed. Generally, these are cases in which sinusitis recurs more than 3 times per year, despite other treatments. HOME CARE INSTRUCTIONS   Drink plenty of water. Water helps thin the mucus so your sinuses can drain more easily.  Use a humidifier.  Inhale steam 3 to 4 times a day (for example, sit in the bathroom with the shower running).  Apply a warm, moist washcloth to your face 3 to 4   times a day, or as directed by your caregiver.  Use saline nasal sprays to help moisten and clean your sinuses.  Take over-the-counter or prescription medicines for pain, discomfort, or fever only as directed by your caregiver. SEEK IMMEDIATE MEDICAL CARE IF:  You have increasing pain or severe  headaches.  You have nausea, vomiting, or drowsiness.  You have swelling around your face.  You have vision problems.  You have a stiff neck.  You have difficulty breathing. MAKE SURE YOU:   Understand these instructions.  Will watch your condition.  Will get help right away if you are not doing well or get worse. Document Released: 10/23/2005 Document Revised: 01/15/2012 Document Reviewed: 11/07/2011 [employer] Patient Information 2014 [employer].   

## 2015-09-10 NOTE — Assessment & Plan Note (Signed)
Rx Azithromycin.  Increase fluids.  Rest.  Saline nasal spray.  Probiotic.  Mucinex as directed.  Humidifier in bedroom. Continue allergy medications.  Call or return to clinic if symptoms are not improving.

## 2015-10-19 ENCOUNTER — Telehealth: Payer: Self-pay | Admitting: Behavioral Health

## 2015-10-19 NOTE — Telephone Encounter (Signed)
Unable to reach patient at time of Pre-Visit Call.  Left message for patient to return call when available.    

## 2015-10-20 ENCOUNTER — Encounter: Payer: Self-pay | Admitting: Physician Assistant

## 2015-10-20 ENCOUNTER — Ambulatory Visit: Payer: Managed Care, Other (non HMO)

## 2015-10-20 ENCOUNTER — Ambulatory Visit (INDEPENDENT_AMBULATORY_CARE_PROVIDER_SITE_OTHER): Payer: Managed Care, Other (non HMO) | Admitting: Physician Assistant

## 2015-10-20 VITALS — BP 130/62 | HR 74 | Temp 98.1°F | Ht 69.0 in | Wt 172.8 lb

## 2015-10-20 DIAGNOSIS — Z125 Encounter for screening for malignant neoplasm of prostate: Secondary | ICD-10-CM

## 2015-10-20 DIAGNOSIS — Z23 Encounter for immunization: Secondary | ICD-10-CM | POA: Diagnosis not present

## 2015-10-20 DIAGNOSIS — Z0000 Encounter for general adult medical examination without abnormal findings: Secondary | ICD-10-CM

## 2015-10-20 DIAGNOSIS — Z0001 Encounter for general adult medical examination with abnormal findings: Secondary | ICD-10-CM

## 2015-10-20 DIAGNOSIS — I10 Essential (primary) hypertension: Secondary | ICD-10-CM

## 2015-10-20 DIAGNOSIS — B079 Viral wart, unspecified: Secondary | ICD-10-CM | POA: Diagnosis not present

## 2015-10-20 LAB — COMPREHENSIVE METABOLIC PANEL
ALBUMIN: 4.5 g/dL (ref 3.5–5.2)
ALK PHOS: 56 U/L (ref 39–117)
ALT: 19 U/L (ref 0–53)
AST: 22 U/L (ref 0–37)
BUN: 14 mg/dL (ref 6–23)
CO2: 29 mEq/L (ref 19–32)
Calcium: 9.2 mg/dL (ref 8.4–10.5)
Chloride: 103 mEq/L (ref 96–112)
Creatinine, Ser: 0.95 mg/dL (ref 0.40–1.50)
GFR: 86.7 mL/min (ref >60.00)
Glucose, Bld: 92 mg/dL (ref 70–99)
POTASSIUM: 4.9 meq/L (ref 3.5–5.1)
SODIUM: 140 meq/L (ref 135–145)
TOTAL PROTEIN: 7.1 g/dL (ref 6.0–8.3)
Total Bilirubin: 0.8 mg/dL (ref 0.2–1.2)

## 2015-10-20 LAB — URINALYSIS, ROUTINE W REFLEX MICROSCOPIC
Bilirubin Urine: NEGATIVE
Hgb urine dipstick: NEGATIVE
KETONES UR: NEGATIVE
Leukocytes, UA: NEGATIVE
NITRITE: NEGATIVE
RBC / HPF: NONE SEEN (ref 0–?)
SPECIFIC GRAVITY, URINE: 1.02 (ref 1.000–1.030)
Total Protein, Urine: NEGATIVE
URINE GLUCOSE: NEGATIVE
Urobilinogen, UA: 0.2 (ref 0.0–1.0)
WBC, UA: NONE SEEN (ref 0–?)
pH: 7.5 (ref 5.0–8.0)

## 2015-10-20 LAB — CBC
HEMATOCRIT: 48.1 % (ref 39.0–52.0)
HEMOGLOBIN: 16.3 g/dL (ref 13.0–17.0)
MCHC: 33.8 g/dL (ref 30.0–36.0)
MCV: 97.3 fl (ref 78.0–100.0)
PLATELETS: 240 10*3/uL (ref 150.0–400.0)
RBC: 4.95 Mil/uL (ref 4.22–5.81)
RDW: 13.8 % (ref 11.5–15.5)
WBC: 8.9 10*3/uL (ref 4.0–10.5)

## 2015-10-20 LAB — LIPID PANEL
CHOL/HDL RATIO: 2
CHOLESTEROL: 168 mg/dL (ref 0–200)
HDL: 78.2 mg/dL (ref >39.00)
LDL CALC: 79 mg/dL (ref 0–99)
NONHDL: 89.74
Triglycerides: 52 mg/dL (ref 0.0–149.0)
VLDL: 10.4 mg/dL (ref 0.0–40.0)

## 2015-10-20 LAB — PSA: PSA: 2.73 ng/mL (ref 0.10–4.00)

## 2015-10-20 LAB — HEMOGLOBIN A1C: HEMOGLOBIN A1C: 5.4 % (ref 4.6–6.5)

## 2015-10-20 NOTE — Patient Instructions (Signed)
Please go to the lab for blood work.  I will call you with your results. If your blood work is normal we will follow-up yearly for physicals.  If anything is abnormal we will treat you and get you in for a follow-up.  Please speak with your insurance representative to see if they will cover a Hepatitis C screening. I do recommend this for you.  You will be contacted by Dermatology for further assessment and treatment of the wart on your toe giving how extensive it is.  Preventive Care for Adults, Male A healthy lifestyle and preventive care can promote health and wellness. Preventive health guidelines for men include the following key practices:  A routine yearly physical is a good way to check with your health care provider about your health and preventative screening. It is a chance to share any concerns and updates on your health and to receive a thorough exam.  Visit your dentist for a routine exam and preventative care every 6 months. Brush your teeth twice a day and floss once a day. Good oral hygiene prevents tooth decay and gum disease.  The frequency of eye exams is based on your age, health, family medical history, use of contact lenses, and other factors. Follow your health care provider's recommendations for frequency of eye exams.  Eat a healthy diet. Foods such as vegetables, fruits, whole grains, low-fat dairy products, and lean protein foods contain the nutrients you need without too many calories. Decrease your intake of foods high in solid fats, added sugars, and salt. Eat the right amount of calories for you.Get information about a proper diet from your health care provider, if necessary.  Regular physical exercise is one of the most important things you can do for your health. Most adults should get at least 150 minutes of moderate-intensity exercise (any activity that increases your heart rate and causes you to sweat) each week. In addition, most adults need  muscle-strengthening exercises on 2 or more days a week.  Maintain a healthy weight. The body mass index (BMI) is a screening tool to identify possible weight problems. It provides an estimate of body fat based on height and weight. Your health care provider can find your BMI and can help you achieve or maintain a healthy weight.For adults 20 years and older:  A BMI below 18.5 is considered underweight.  A BMI of 18.5 to 24.9 is normal.  A BMI of 25 to 29.9 is considered overweight.  A BMI of 30 and above is considered obese.  Maintain normal blood lipids and cholesterol levels by exercising and minimizing your intake of saturated fat. Eat a balanced diet with plenty of fruit and vegetables. Blood tests for lipids and cholesterol should begin at age 54 and be repeated every 5 years. If your lipid or cholesterol levels are high, you are over 50, or you are at high risk for heart disease, you may need your cholesterol levels checked more frequently.Ongoing high lipid and cholesterol levels should be treated with medicines if diet and exercise are not working.  If you smoke, find out from your health care provider how to quit. If you do not use tobacco, do not start.  Lung cancer screening is recommended for adults aged 55-80 years who are at high risk for developing lung cancer because of a history of smoking. A yearly low-dose CT scan of the lungs is recommended for people who have at least a 30-pack-year history of smoking and are a current smoker  or have quit within the past 15 years. A pack year of smoking is smoking an average of 1 pack of cigarettes a day for 1 year (for example: 1 pack a day for 30 years or 2 packs a day for 15 years). Yearly screening should continue until the smoker has stopped smoking for at least 15 years. Yearly screening should be stopped for people who develop a health problem that would prevent them from having lung cancer treatment.  If you choose to drink alcohol,  do not have more than 2 drinks per day. One drink is considered to be 12 ounces (355 mL) of beer, 5 ounces (148 mL) of wine, or 1.5 ounces (44 mL) of liquor.  Avoid use of street drugs. Do not share needles with anyone. Ask for help if you need support or instructions about stopping the use of drugs.  High blood pressure causes heart disease and increases the risk of stroke. Your blood pressure should be checked at least every 1-2 years. Ongoing high blood pressure should be treated with medicines, if weight loss and exercise are not effective.  If you are 51-29 years old, ask your health care provider if you should take aspirin to prevent heart disease.  Diabetes screening is done by taking a blood sample to check your blood glucose level after you have not eaten for a certain period of time (fasting). If you are not overweight and you do not have risk factors for diabetes, you should be screened once every 3 years starting at age 14. If you are overweight or obese and you are 53-55 years of age, you should be screened for diabetes every year as part of your cardiovascular risk assessment.  Colorectal cancer can be detected and often prevented. Most routine colorectal cancer screening begins at the age of 77 and continues through age 90. However, your health care provider may recommend screening at an earlier age if you have risk factors for colon cancer. On a yearly basis, your health care provider may provide home test kits to check for hidden blood in the stool. Use of a small camera at the end of a tube to directly examine the colon (sigmoidoscopy or colonoscopy) can detect the earliest forms of colorectal cancer. Talk to your health care provider about this at age 19, when routine screening begins. Direct exam of the colon should be repeated every 5-10 years through age 71, unless early forms of precancerous polyps or small growths are found.  People who are at an increased risk for hepatitis B  should be screened for this virus. You are considered at high risk for hepatitis B if:  You were born in a country where hepatitis B occurs often. Talk with your health care provider about which countries are considered high risk.  Your parents were born in a high-risk country and you have not received a shot to protect against hepatitis B (hepatitis B vaccine).  You have HIV or AIDS.  You use needles to inject street drugs.  You live with, or have sex with, someone who has hepatitis B.  You are a man who has sex with other men (MSM).  You get hemodialysis treatment.  You take certain medicines for conditions such as cancer, organ transplantation, and autoimmune conditions.  Hepatitis C blood testing is recommended for all people born from [birth year] through 1965 and any individual with known risks for hepatitis C.  Practice safe sex. Use condoms and avoid high-risk sexual practices to reduce the  spread of sexually transmitted infections (STIs). STIs include gonorrhea, chlamydia, syphilis, trichomonas, herpes, HPV, and human immunodeficiency virus (HIV). Herpes, HIV, and HPV are viral illnesses that have no cure. They can result in disability, cancer, and death.  If you are a man who has sex with other men, you should be screened at least once per year for:  HIV.  Urethral, rectal, and pharyngeal infection of gonorrhea, chlamydia, or both.  If you are at risk of being infected with HIV, it is recommended that you take a prescription medicine daily to prevent HIV infection. This is called preexposure prophylaxis (PrEP). You are considered at risk if:  You are a man who has sex with other men (MSM) and have other risk factors.  You are a heterosexual man, are sexually active, and are at increased risk for HIV infection.  You take drugs by injection.  You are sexually active with a partner who has HIV.  Talk with your health care provider about whether you are at high risk of being  infected with HIV. If you choose to begin PrEP, you should first be tested for HIV. You should then be tested every 3 months for as long as you are taking PrEP.  A one-time screening for abdominal aortic aneurysm (AAA) and surgical repair of large AAAs by ultrasound are recommended for men ages 65 to 52 years who are current or former smokers.  Healthy men should no longer receive prostate-specific antigen (PSA) blood tests as part of routine cancer screening. Talk with your health care provider about prostate cancer screening.  Testicular cancer screening is not recommended for adult males who have no symptoms. Screening includes self-exam, a health care provider exam, and other screening tests. Consult with your health care provider about any symptoms you have or any concerns you have about testicular cancer.  Use sunscreen. Apply sunscreen liberally and repeatedly throughout the day. You should seek shade when your shadow is shorter than you. Protect yourself by wearing long sleeves, pants, a wide-brimmed hat, and sunglasses year round, whenever you are outdoors.  Once a month, do a whole-body skin exam, using a mirror to look at the skin on your back. Tell your health care provider about new moles, moles that have irregular borders, moles that are larger than a pencil eraser, or moles that have changed in shape or color.  Stay current with required vaccines (immunizations).  Influenza vaccine. All adults should be immunized every year.  Tetanus, diphtheria, and acellular pertussis (Td, Tdap) vaccine. An adult who has not previously received Tdap or who does not know his vaccine status should receive 1 dose of Tdap. This initial dose should be followed by tetanus and diphtheria toxoids (Td) booster doses every 10 years. Adults with an unknown or incomplete history of completing a 3-dose immunization series with Td-containing vaccines should begin or complete a primary immunization series including  a Tdap dose. Adults should receive a Td booster every 10 years.  Varicella vaccine. An adult without evidence of immunity to varicella should receive 2 doses or a second dose if he has previously received 1 dose.  Human papillomavirus (HPV) vaccine. Males aged 11-21 years who have not received the vaccine previously should receive the 3-dose series. Males aged 22-26 years may be immunized. Immunization is recommended through the age of 36 years for any male who has sex with males and did not get any or all doses earlier. Immunization is recommended for any person with an immunocompromised condition through the  age of 3 years if he did not get any or all doses earlier. During the 3-dose series, the second dose should be obtained 4-8 weeks after the first dose. The third dose should be obtained 24 weeks after the first dose and 16 weeks after the second dose.  Zoster vaccine. One dose is recommended for adults aged 21 years or older unless certain conditions are present.  Measles, mumps, and rubella (MMR) vaccine. Adults born before [birth year] generally are considered immune to measles and mumps. Adults born in [birth year] or later should have 1 or more doses of MMR vaccine unless there is a contraindication to the vaccine or there is laboratory evidence of immunity to each of the three diseases. A routine second dose of MMR vaccine should be obtained at least 28 days after the first dose for students attending postsecondary schools, health care workers, or international travelers. People who received inactivated measles vaccine or an unknown type of measles vaccine during 1963-1967 should receive 2 doses of MMR vaccine. People who received inactivated mumps vaccine or an unknown type of mumps vaccine before 1979 and are at high risk for mumps infection should consider immunization with 2 doses of MMR vaccine. Unvaccinated health care workers born before [birth year] who lack laboratory evidence of measles, mumps, or rubella  immunity or laboratory confirmation of disease should consider measles and mumps immunization with 2 doses of MMR vaccine or rubella immunization with 1 dose of MMR vaccine.  Pneumococcal 13-valent conjugate (PCV13) vaccine. When indicated, a person who is uncertain of his immunization history and has no record of immunization should receive the PCV13 vaccine. All adults 26 years of age and older should receive this vaccine. An adult aged 84 years or older who has certain medical conditions and has not been previously immunized should receive 1 dose of PCV13 vaccine. This PCV13 should be followed with a dose of pneumococcal polysaccharide (PPSV23) vaccine. Adults who are at high risk for pneumococcal disease should obtain the PPSV23 vaccine at least 8 weeks after the dose of PCV13 vaccine. Adults older than 57 years of age who have normal immune system function should obtain the PPSV23 vaccine dose at least 1 year after the dose of PCV13 vaccine.  Pneumococcal polysaccharide (PPSV23) vaccine. When PCV13 is also indicated, PCV13 should be obtained first. All adults aged 55 years and older should be immunized. An adult younger than age 53 years who has certain medical conditions should be immunized. Any person who resides in a nursing home or long-term care facility should be immunized. An adult smoker should be immunized. People with an immunocompromised condition and certain other conditions should receive both PCV13 and PPSV23 vaccines. People with human immunodeficiency virus (HIV) infection should be immunized as soon as possible after diagnosis. Immunization during chemotherapy or radiation therapy should be avoided. Routine use of PPSV23 vaccine is not recommended for American Indians, [place] Natives, or people younger than 65 years unless there are medical conditions that require PPSV23 vaccine. When indicated, people who have unknown immunization and have no record of immunization should receive PPSV23  vaccine. One-time revaccination 5 years after the first dose of PPSV23 is recommended for people aged 19-64 years who have chronic kidney failure, nephrotic syndrome, asplenia, or immunocompromised conditions. People who received 1-2 doses of PPSV23 before age 40 years should receive another dose of PPSV23 vaccine at age 70 years or later if at least 5 years have passed since the previous dose. Doses of PPSV23 are not needed for people  immunized with PPSV23 at or after age 15 years.  Meningococcal vaccine. Adults with asplenia or persistent complement component deficiencies should receive 2 doses of quadrivalent meningococcal conjugate (MenACWY-D) vaccine. The doses should be obtained at least 2 months apart. Microbiologists working with certain meningococcal bacteria, [place] recruits, people at risk during an outbreak, and people who travel to or live in countries with a high rate of meningitis should be immunized. A first-year college student up through age 69 years who is living in a residence hall should receive a dose if he did not receive a dose on or after his 16th birthday. Adults who have certain high-risk conditions should receive one or more doses of vaccine.  Hepatitis A vaccine. Adults who wish to be protected from this disease, have chronic liver disease, work with hepatitis A-infected animals, work in hepatitis A research labs, or travel to or work in countries with a high rate of hepatitis A should be immunized. Adults who were previously unvaccinated and who anticipate close contact with an international adoptee during the first 60 days after arrival in the [place] from a country with a high rate of hepatitis A should be immunized.  Hepatitis B vaccine. Adults should be immunized if they wish to be protected from this disease, are under age 76 years and have diabetes, have chronic liver disease, have had more than one sex partner in the past 6 months, may be exposed to blood or other  infectious body fluids, are household contacts or sex partners of hepatitis B positive people, are clients or workers in certain care facilities, or travel to or work in countries with a high rate of hepatitis B.  Haemophilus influenzae type b (Hib) vaccine. A previously unvaccinated person with asplenia or sickle cell disease or having a scheduled splenectomy should receive 1 dose of Hib vaccine. Regardless of previous immunization, a recipient of a hematopoietic stem cell transplant should receive a 3-dose series 6-12 months after his successful transplant. Hib vaccine is not recommended for adults with HIV infection. Preventive Service / Frequency Ages 35 to 70  Blood pressure check.** / Every 3-5 years.  Lipid and cholesterol check.** / Every 5 years beginning at age 78.  Hepatitis C blood test.** / For any individual with known risks for hepatitis C.  Skin self-exam. / Monthly.  Influenza vaccine. / Every year.  Tetanus, diphtheria, and acellular pertussis (Tdap, Td) vaccine.** / Consult your health care provider. 1 dose of Td every 10 years.  Varicella vaccine.** / Consult your health care provider.  HPV vaccine. / 3 doses over 6 months, if 56 or younger.  Measles, mumps, rubella (MMR) vaccine.** / You need at least 1 dose of MMR if you were born in 1957 or later. You may also need a second dose.  Pneumococcal 13-valent conjugate (PCV13) vaccine.** / Consult your health care provider.  Pneumococcal polysaccharide (PPSV23) vaccine.** / 1 to 2 doses if you smoke cigarettes or if you have certain conditions.  Meningococcal vaccine.** / 1 dose if you are age 70 to 21 years and a Market researcher living in a residence hall, or have one of several medical conditions. You may also need additional booster doses.  Hepatitis A vaccine.** / Consult your health care provider.  Hepatitis B vaccine.** / Consult your health care provider.  Haemophilus influenzae type b (Hib)  vaccine.** / Consult your health care provider. Ages 21 to 49  Blood pressure check.** / Every year.  Lipid and cholesterol check.** / Every  5 years beginning at age 58.  Lung cancer screening. / Every year if you are aged 40-80 years and have a 30-pack-year history of smoking and currently smoke or have quit within the past 15 years. Yearly screening is stopped once you have quit smoking for at least 15 years or develop a health problem that would prevent you from having lung cancer treatment.  Fecal occult blood test (FOBT) of stool. / Every year beginning at age 5 and continuing until age 62. You may not have to do this test if you get a colonoscopy every 10 years.  Flexible sigmoidoscopy** or colonoscopy.** / Every 5 years for a flexible sigmoidoscopy or every 10 years for a colonoscopy beginning at age 62 and continuing until age 93.  Hepatitis C blood test.** / For all people born from [birth year] through 1965 and any individual with known risks for hepatitis C.  Skin self-exam. / Monthly.  Influenza vaccine. / Every year.  Tetanus, diphtheria, and acellular pertussis (Tdap/Td) vaccine.** / Consult your health care provider. 1 dose of Td every 10 years.  Varicella vaccine.** / Consult your health care provider.  Zoster vaccine.** / 1 dose for adults aged 82 years or older.  Measles, mumps, rubella (MMR) vaccine.** / You need at least 1 dose of MMR if you were born in 1955 or later. You may also need a second dose.  Pneumococcal 13-valent conjugate (PCV13) vaccine.** / Consult your health care provider.  Pneumococcal polysaccharide (PPSV23) vaccine.** / 1 to 2 doses if you smoke cigarettes or if you have certain conditions.  Meningococcal vaccine.** / Consult your health care provider.  Hepatitis A vaccine.** / Consult your health care provider.  Hepatitis B vaccine.** / Consult your health care provider.  Haemophilus influenzae type b (Hib) vaccine.** / Consult your health care  provider. Ages 73 and over  Blood pressure check.** / Every year.  Lipid and cholesterol check.**/ Every 5 years beginning at age 20.  Lung cancer screening. / Every year if you are aged 32-80 years and have a 30-pack-year history of smoking and currently smoke or have quit within the past 15 years. Yearly screening is stopped once you have quit smoking for at least 15 years or develop a health problem that would prevent you from having lung cancer treatment.  Fecal occult blood test (FOBT) of stool. / Every year beginning at age 31 and continuing until age 101. You may not have to do this test if you get a colonoscopy every 10 years.  Flexible sigmoidoscopy** or colonoscopy.** / Every 5 years for a flexible sigmoidoscopy or every 10 years for a colonoscopy beginning at age 65 and continuing until age 75.  Hepatitis C blood test.** / For all people born from [birth year] through 1963 and any individual with known risks for hepatitis C.  Abdominal aortic aneurysm (AAA) screening.** / A one-time screening for ages 88 to 17 years who are current or former smokers.  Skin self-exam. / Monthly.  Influenza vaccine. / Every year.  Tetanus, diphtheria, and acellular pertussis (Tdap/Td) vaccine.** / 1 dose of Td every 10 years.  Varicella vaccine.** / Consult your health care provider.  Zoster vaccine.** / 1 dose for adults aged 50 years or older.  Pneumococcal 13-valent conjugate (PCV13) vaccine.** / 1 dose for all adults aged 58 years and older.  Pneumococcal polysaccharide (PPSV23) vaccine.** / 1 dose for all adults aged 87 years and older.  Meningococcal vaccine.** / Consult your health care provider.  Hepatitis A vaccine.** /  Consult your health care provider.  Hepatitis B vaccine.** / Consult your health care provider.  Haemophilus influenzae type b (Hib) vaccine.** / Consult your health care provider. **Family history and personal history of risk and conditions may change your health care  provider's recommendations.   This information is not intended to replace advice given to you by your health care provider. Make sure you discuss any questions you have with your health care provider.   Document Released: 12/19/2001 Document Revised: 11/13/2014 Document Reviewed: 03/20/2011 Elsevier Interactive Patient Education [employer].

## 2015-10-20 NOTE — Progress Notes (Signed)
Pre visit review using our clinic review tool, if applicable. No additional management support is needed unless otherwise documented below in the visit note. 

## 2015-10-20 NOTE — Progress Notes (Signed)
Patient presents to clinic today for annual exam.  Patient is fasting for labs.  Acute Concerns: Patient endorses lesion of growth of great to over the past several months. Sometimes irritated by shoes, otherwise without pain or pruritus.  Chronic Issues: Hypertension -- Patient endorses taking his losartan daily as directed. Patient denies chest pain, palpitations, lightheadedness, dizziness, vision changes or frequent headaches.  BP Readings from Last 3 Encounters:  10/20/15 130/62  09/10/15 142/57  09/08/15 142/60   Allergic Rhinitis -- Is taking Singulair daily with flonase PRN. Endorses good relief of symptoms.  Health Maintenance: Dental -- up-to-date Vision -- up-to-date -- contact lens wearer Immunizations -- Tetanus up-to-date. Flu shot given today. Colonoscopy -- Up-to-date.  Past Medical History  Diagnosis Date  . Allergy   . Arthritis   . Heart murmur   . MITRAL VALVE PROLAPSE   . Bicuspid aortic valve   . TINNITUS, CHRONIC, BILATERAL   . DEGENERATIVE DISC DISEASE, CERVICAL SPINE   . PSA, INCREASED   . History of chicken pox   . Hypertension     Past Surgical History  Procedure Laterality Date  . Tonsillectome    . Colonoscopy    . Polypectomy      Colon  . Wisdom tooth extraction    . Cyst removal neck      And Face    Current Outpatient Prescriptions on File Prior to Visit  Medication Sig Dispense Refill  . cetirizine (ZYRTEC) 5 MG tablet Take 5 mg by mouth daily.      . fluticasone (FLONASE) 50 MCG/ACT nasal spray Place 2 sprays into both nostrils daily. 48 g 3  . losartan (COZAAR) 25 MG tablet Take 1 tablet (25 mg total) by mouth daily. 90 tablet 3  . montelukast (SINGULAIR) 10 MG tablet Take 1 tablet (10 mg total) by mouth at bedtime. 90 tablet 1  . naproxen sodium (ANAPROX) 220 MG tablet Take 220 mg by mouth as needed (for arthritis pain).     Current Facility-Administered Medications on File Prior to Visit  Medication Dose Route  Frequency Provider Last Rate Last Dose  . 0.9 %  sodium chloride infusion  500 mL Intravenous Continuous Sable Feil, MD        No Known Allergies  Family History  Problem Relation Age of Onset  . Colon cancer Mother 35    Deceased  . Breast cancer Sister   . Lung cancer Mother   . Liver cancer Mother   . Lymphoma Father 35    Deceased  . Diabetes Father   . Cancer Other     Paternal Grandparents  . Cancer Other     Maternal Grandparents  . Emphysema Paternal Grandfather   . Emphysema Maternal Grandfather   . Cancer Paternal Aunt   . Cancer Maternal Aunt     Social History   Social History  . Marital Status: Married    Spouse Name: N/A  . Number of Children: N/A  . Years of Education: N/A   Occupational History  . Not on file.   Social History Main Topics  . Smoking status: Current Every Day Smoker -- 0.50 packs/day    Types: Cigarettes  . Smokeless tobacco: Never Used  . Alcohol Use: 4.2 oz/week    7 Standard drinks or equivalent per week  . Drug Use: No  . Sexual Activity:    Partners: Female     Comment: wife   Other Topics Concern  . Not on  file   Social History Narrative   Review of Systems  Constitutional: Negative for fever and weight loss.  HENT: Negative for ear discharge, ear pain, hearing loss and tinnitus.   Eyes: Negative for blurred vision, double vision, photophobia and pain.  Respiratory: Negative for cough and shortness of breath.   Cardiovascular: Negative for chest pain and palpitations.  Gastrointestinal: Negative for heartburn, nausea, vomiting, abdominal pain, diarrhea, constipation, blood in stool and melena.  Genitourinary: Negative for dysuria, urgency, frequency, hematuria and flank pain.  Musculoskeletal: Negative for falls.  Skin:       Growth of toe  Neurological: Negative for dizziness, loss of consciousness and headaches.  Endo/Heme/Allergies: Negative for environmental allergies.  Psychiatric/Behavioral: Negative  for depression, suicidal ideas, hallucinations and substance abuse. The patient is not nervous/anxious and does not have insomnia.     BP 130/62 mmHg  Pulse 74  Temp(Src) 98.1 F (36.7 C) (Oral)  Ht '5\' 9"'  (1.753 m)  Wt 172 lb 12.8 oz (78.382 kg)  BMI 25.51 kg/m2  SpO2 97%  Physical Exam  Constitutional: He is oriented to person, place, and time and well-developed, well-nourished, and in no distress.  HENT:  Head: Normocephalic and atraumatic.  Right Ear: External ear normal.  Left Ear: External ear normal.  Nose: Nose normal.  Mouth/Throat: Oropharynx is clear and moist. No oropharyngeal exudate.  Eyes: Conjunctivae and EOM are normal. Pupils are equal, round, and reactive to light.  Neck: Neck supple. No thyromegaly present.  Cardiovascular: Normal rate, regular rhythm, normal heart sounds and intact distal pulses.   Pulmonary/Chest: Effort normal and breath sounds normal. No respiratory distress. He has no wheezes. He has no rales. He exhibits no tenderness.  Abdominal: Soft. Bowel sounds are normal. He exhibits no distension and no mass. There is no tenderness. There is no rebound and no guarding.  Genitourinary: Testes/scrotum normal.  Lymphadenopathy:    He has no cervical adenopathy.  Neurological: He is alert and oriented to person, place, and time.  Skin: Skin is warm and dry. No rash noted.     Psychiatric: Affect normal.  Vitals reviewed.   Recent Results (from the past 2160 hour(s))  CBC     Status: None   Collection Time: 10/20/15  8:44 AM  Result Value Ref Range   WBC 8.9 4.0 - 10.5 K/uL   RBC 4.95 4.22 - 5.81 Mil/uL   Platelets 240.0 150.0 - 400.0 K/uL   Hemoglobin 16.3 13.0 - 17.0 g/dL   HCT 48.1 39.0 - 52.0 %   MCV 97.3 78.0 - 100.0 fl   MCHC 33.8 30.0 - 36.0 g/dL   RDW 13.8 11.5 - 15.5 %  Comp Met (CMET)     Status: None   Collection Time: 10/20/15  8:44 AM  Result Value Ref Range   Sodium 140 135 - 145 mEq/L   Potassium 4.9 3.5 - 5.1 mEq/L    Chloride 103 96 - 112 mEq/L   CO2 29 19 - 32 mEq/L   Glucose, Bld 92 70 - 99 mg/dL   BUN 14 6 - 23 mg/dL   Creatinine, Ser 0.95 0.40 - 1.50 mg/dL   Total Bilirubin 0.8 0.2 - 1.2 mg/dL   Alkaline Phosphatase 56 39 - 117 U/L   AST 22 0 - 37 U/L   ALT 19 0 - 53 U/L   Total Protein 7.1 6.0 - 8.3 g/dL   Albumin 4.5 3.5 - 5.2 g/dL   Calcium 9.2 8.4 - 10.5 mg/dL  GFR 86.70 >60.00 mL/min  Urinalysis, Routine w reflex microscopic     Status: None   Collection Time: 10/20/15  8:44 AM  Result Value Ref Range   Color, Urine YELLOW Yellow;Lt. Yellow   APPearance CLEAR Clear   Specific Gravity, Urine 1.020 1.000-1.030   pH 7.5 5.0 - 8.0   Total Protein, Urine NEGATIVE Negative   Urine Glucose NEGATIVE Negative   Ketones, ur NEGATIVE Negative   Bilirubin Urine NEGATIVE Negative   Hgb urine dipstick NEGATIVE Negative   Urobilinogen, UA 0.2 0.0 - 1.0   Leukocytes, UA NEGATIVE Negative   Nitrite NEGATIVE Negative   WBC, UA none seen 0-2/hpf   RBC / HPF none seen 0-2/hpf  Lipid Profile     Status: None   Collection Time: 10/20/15  8:44 AM  Result Value Ref Range   Cholesterol 168 0 - 200 mg/dL    Comment: ATP III Classification       Desirable:  < 200 mg/dL               Borderline High:  200 - 239 mg/dL          High:  > = 240 mg/dL   Triglycerides 52.0 0.0 - 149.0 mg/dL    Comment: Normal:  <150 mg/dLBorderline High:  150 - 199 mg/dL   HDL 78.20 >39.00 mg/dL   VLDL 10.4 0.0 - 40.0 mg/dL   LDL Cholesterol 79 0 - 99 mg/dL   Total CHOL/HDL Ratio 2     Comment:                Men          Women1/2 Average Risk     3.4          3.3Average Risk          5.0          4.42X Average Risk          9.6          7.13X Average Risk          15.0          11.0                       NonHDL 89.74     Comment: NOTE:  Non-HDL goal should be 30 mg/dL higher than patient's LDL goal (i.e. LDL goal of < 70 mg/dL, would have non-HDL goal of < 100 mg/dL)  Hemoglobin A1c     Status: None   Collection Time:  10/20/15  8:44 AM  Result Value Ref Range   Hgb A1c MFr Bld 5.4 4.6 - 6.5 %    Comment: Glycemic Control Guidelines for People with Diabetes:Non Diabetic:  <6%Goal of Therapy: <7%Additional Action Suggested:  >8%   PSA     Status: None   Collection Time: 10/20/15  8:44 AM  Result Value Ref Range   PSA 2.73 0.10 - 4.00 ng/mL    Assessment/Plan: Hypertension Well-controlled. Asymptomatic. Continue current regimen. Will check BMP today.  Prostate cancer screening Asymptomatic.Will proceed with PSA after risks and benefits pf screening labs and DRE reviewed.   Visit for preventive health examination Depression screen negative. Health Maintenance reviewed -- flu shot updated today. Colonoscopy up-to-date. Preventive schedule discussed and handout given in AVS. Will obtain fasting labs today.   Warts of foot Referral to dermatology placed for removal. Supportive measures reviewed.

## 2015-10-25 DIAGNOSIS — B079 Viral wart, unspecified: Secondary | ICD-10-CM | POA: Insufficient documentation

## 2015-10-25 NOTE — Assessment & Plan Note (Signed)
Referral to dermatology placed for removal. Supportive measures reviewed.

## 2015-10-25 NOTE — Assessment & Plan Note (Signed)
Depression screen negative. Health Maintenance reviewed -- flu shot updated today. Colonoscopy up-to-date. Preventive schedule discussed and handout given in AVS. Will obtain fasting labs today.

## 2015-10-25 NOTE — Assessment & Plan Note (Signed)
Asymptomatic.Will proceed with PSA after risks and benefits pf screening labs and DRE reviewed.

## 2015-10-25 NOTE — Assessment & Plan Note (Signed)
Well-controlled. Asymptomatic. Continue current regimen. Will check BMP today.

## 2016-01-24 ENCOUNTER — Encounter: Payer: Self-pay | Admitting: Internal Medicine

## 2016-02-12 ENCOUNTER — Encounter: Payer: Self-pay | Admitting: Physician Assistant

## 2016-02-12 DIAGNOSIS — J302 Other seasonal allergic rhinitis: Secondary | ICD-10-CM

## 2016-02-13 MED ORDER — MONTELUKAST SODIUM 10 MG PO TABS: 10.0000 mg | ORAL_TABLET | Freq: Every day | ORAL | Status: DC

## 2016-05-09 ENCOUNTER — Other Ambulatory Visit: Payer: Self-pay | Admitting: Physician Assistant

## 2016-05-10 NOTE — Telephone Encounter (Signed)
Rx request to pharmacy/SLS Requested drug refills are authorized, however, the patient needs further evaluation and/or laboratory testing before further refills are given. Ask him to make an appointment for this.  Please call patient and schedule Follow-up for Blood Pressure/SLS 07/05

## 2016-08-13 ENCOUNTER — Other Ambulatory Visit: Payer: Self-pay | Admitting: Physician Assistant

## 2016-08-13 DIAGNOSIS — J302 Other seasonal allergic rhinitis: Secondary | ICD-10-CM

## 2016-08-15 NOTE — Telephone Encounter (Signed)
Rx request sent to mail order pharmacy; please contact patient and schedule F/U appointment, as he will not receive any further refill authorizations without prior appointment per provider/SLS 10/10

## 2016-08-29 ENCOUNTER — Encounter: Payer: Self-pay | Admitting: Internal Medicine

## 2016-09-04 ENCOUNTER — Encounter: Payer: Self-pay | Admitting: Physician Assistant

## 2016-09-04 ENCOUNTER — Ambulatory Visit (INDEPENDENT_AMBULATORY_CARE_PROVIDER_SITE_OTHER): Payer: Managed Care, Other (non HMO) | Admitting: Physician Assistant

## 2016-09-04 VITALS — BP 134/68 | HR 66 | Temp 98.2°F | Resp 16 | Ht 69.0 in | Wt 172.2 lb

## 2016-09-04 DIAGNOSIS — Z23 Encounter for immunization: Secondary | ICD-10-CM

## 2016-09-04 DIAGNOSIS — I10 Essential (primary) hypertension: Secondary | ICD-10-CM

## 2016-09-04 DIAGNOSIS — F418 Other specified anxiety disorders: Secondary | ICD-10-CM

## 2016-09-04 NOTE — Progress Notes (Signed)
Patient presents to clinic today for follow-up of hypertension. Patient is currently on losartan 25 mg. Is taking daily as directed. Patient denies chest pain, lightheadedness, dizziness, vision changes or frequent headaches. Patient followed by Cardiology [name]). 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 Patient endorses feeling anxious with some panic. Endorses these symptoms have been present over the past few months. Denies accident or known trauma that could be contributing to this. Endorses similar episode a few years back. Patient denies dizziness or lightheadedness. Endorses racing heart during these episodes. Denies depressed moor or anxiety at other times. Denies SI/HI.  Past Medical History:  Diagnosis Date  . Allergy   . Arthritis   . Bicuspid aortic valve   . DEGENERATIVE DISC DISEASE, CERVICAL SPINE   . Heart murmur   . History of chicken pox   . Hypertension   . MITRAL VALVE PROLAPSE   . PSA, INCREASED   . TINNITUS, CHRONIC, BILATERAL     Current Outpatient Prescriptions on File Prior to Visit  Medication Sig Dispense Refill  . cetirizine (ZYRTEC) 5 MG tablet Take 5 mg by mouth daily.      . fluticasone (FLONASE) 50 MCG/ACT nasal spray Place 2 sprays into both nostrils daily as needed for allergies or rhinitis. 48 g 0  . losartan (COZAAR) 25 MG tablet Take 1 tablet (25 mg total) by mouth daily. 90 tablet 3  . montelukast (SINGULAIR) 10 MG tablet Take 1 tablet (10 mg total) by mouth at bedtime. 90 tablet 0  . naproxen sodium (ANAPROX) 220 MG tablet Take 220 mg by mouth as needed (for arthritis pain).     Current Facility-Administered Medications on File Prior to Visit  Medication Dose Route Frequency Provider Last Rate Last Dose  . 0.9 %  sodium chloride infusion  500 mL Intravenous Continuous Sable Feil, MD        No Known Allergies  Family History  Problem Relation Age of Onset  . Colon cancer Mother 81   Deceased  . Breast cancer Sister   . Lung cancer Mother   . Liver cancer Mother   . Lymphoma Father 35    Deceased  . Diabetes Father   . Cancer Other     Paternal Grandparents  . Cancer Other     Maternal Grandparents  . Emphysema Paternal Grandfather   . Emphysema Maternal Grandfather   . Cancer Paternal Aunt   . Cancer Maternal Aunt     Social History   Social History  . Marital status: Married    Spouse name: N/A  . Number of children: N/A  . Years of education: N/A   Social History Main Topics  . Smoking status: Current Every Day Smoker    Packs/day: 0.50    Types: Cigarettes  . Smokeless tobacco: Never Used  . Alcohol use 4.2 oz/week    7 Standard drinks or equivalent per week  . Drug use: No  . Sexual activity: Yes    Partners: Female     Comment: wife   Other Topics Concern  . None   Social History Narrative  . None    Review of Systems - See HPI.  All other ROS are negative.  BP 134/68 (BP Location: Right Arm, Patient Position: Sitting, Cuff Size: Large)   Pulse 66   Temp 98.2 F (36.8 C) (Oral)   Resp 16   Ht 5\' 9"  (1.753 m)   Wt 172 lb 4 oz (78.1  kg)   SpO2 99%   BMI 25.44 kg/m   Physical Exam  Constitutional: He is oriented to person, place, and time and well-developed, well-nourished, and in no distress.  HENT:  Head: Normocephalic and atraumatic.  Eyes: Conjunctivae are normal.  Neck: Neck supple.  Cardiovascular: Normal rate, regular rhythm, normal heart sounds and intact distal pulses.   Pulmonary/Chest: Effort normal and breath sounds normal. No respiratory distress. He has no wheezes. He has no rales. He exhibits no tenderness.  Neurological: He is alert and oriented to person, place, and time.  Skin: Skin is warm and dry. No rash noted.  Psychiatric: Affect normal.  Vitals reviewed.  Assessment/Plan: Hypertension BP normotensive. Asymptomatic. Followed by Cardiology. Will continue current regimen. FU with specialist as  scheduled. CPE in December.  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 Discussed CBT versus pharmacotherapy. Patient has elected to start therapy. Handout given. He is scheduling with [name].    David Rio, PA-C

## 2016-09-04 NOTE — Progress Notes (Signed)
Pre visit review using our clinic review tool, if applicable. No additional management support is needed unless otherwise documented below in the visit note/SLS  

## 2016-09-04 NOTE — Patient Instructions (Signed)
Please continue medications as directed. Schedule your follow-up appointment with Dr. [name]. Make sure to use the handout given to schedule a counseling appointment with Terri.  Follow-up as scheduled in December for your complete physical.

## 2016-09-04 NOTE — Assessment & Plan Note (Signed)
With driving on major highways. Discussed CBT versus pharmacotherapy. Patient has elected to start therapy. Handout given. He is scheduling with [name].

## 2016-09-04 NOTE — Assessment & Plan Note (Signed)
BP normotensive. Asymptomatic. Followed by Cardiology. Will continue current regimen. FU with specialist as scheduled. CPE in December.

## 2016-09-11 ENCOUNTER — Encounter: Payer: Self-pay | Admitting: Physician Assistant

## 2016-09-11 ENCOUNTER — Ambulatory Visit (INDEPENDENT_AMBULATORY_CARE_PROVIDER_SITE_OTHER): Payer: Managed Care, Other (non HMO) | Admitting: Family Medicine

## 2016-09-11 ENCOUNTER — Encounter: Payer: Self-pay | Admitting: Family Medicine

## 2016-09-11 DIAGNOSIS — M549 Dorsalgia, unspecified: Secondary | ICD-10-CM

## 2016-09-11 DIAGNOSIS — F17200 Nicotine dependence, unspecified, uncomplicated: Secondary | ICD-10-CM | POA: Diagnosis not present

## 2016-09-11 DIAGNOSIS — I10 Essential (primary) hypertension: Secondary | ICD-10-CM | POA: Diagnosis not present

## 2016-09-11 DIAGNOSIS — M546 Pain in thoracic spine: Secondary | ICD-10-CM | POA: Diagnosis not present

## 2016-09-11 HISTORY — DX: Dorsalgia, unspecified: M54.9

## 2016-09-11 MED ORDER — TIZANIDINE HCL 4 MG PO TABS: 4.0000 mg | ORAL_TABLET | Freq: Three times a day (TID) | ORAL | 1 refills | Status: DC | PRN

## 2016-09-11 NOTE — Progress Notes (Addendum)
Patient ID: David Cunningham, male   DOB: 1956-03-08, 60 y.o.   MRN: [id number]   Subjective:    Patient ID: David Cunningham, male    DOB: 07-22-1956, 60 y.o.   MRN: [id number]  Chief Complaint  Patient presents with  . Shoulder Pain    right shoulder    HPI Patient is in today for long history of intermittent back pain in right scapular region which has flared this week and is lasting longer and  More intense than usual. No fall or injury. Is noting radicular symptoms of tingling, numbness in weakness radiating down the arm into hand. No falls or trauma. Denies CP/palp/SOB/HA/congestion/fevers/GI or GU c/o. Taking meds as prescribed  Past Medical History:  Diagnosis Date  . Allergy   . Arthritis   . Back pain 09/11/2016  . Bicuspid aortic valve   . DEGENERATIVE DISC DISEASE, CERVICAL SPINE   . Heart murmur   . History of chicken pox   . Hypertension   . MITRAL VALVE PROLAPSE   . PSA, INCREASED   . TINNITUS, CHRONIC, BILATERAL     Past Surgical History:  Procedure Laterality Date  . COLONOSCOPY    . CYST REMOVAL NECK     And Face  . POLYPECTOMY     Colon  . Tonsillectome    . WISDOM TOOTH EXTRACTION      Family History  Problem Relation Age of Onset  . Colon cancer Mother 57    Deceased  . Breast cancer Sister   . Lung cancer Mother   . Liver cancer Mother   . Lymphoma Father 53    Deceased  . Diabetes Father   . Cancer Other     Paternal Grandparents  . Cancer Other     Maternal Grandparents  . Emphysema Paternal Grandfather   . Emphysema Maternal Grandfather   . Cancer Paternal Aunt   . Cancer Maternal Aunt     Social History   Social History  . Marital status: Married    Spouse name: N/A  . Number of children: N/A  . Years of education: N/A   Occupational History  . Not on file.   Social History Main Topics  . Smoking status: Current Every Day Smoker    Packs/day: 0.50    Types: Cigarettes  . Smokeless tobacco: Never Used  . Alcohol use 4.2  oz/week    7 Standard drinks or equivalent per week  . Drug use: No  . Sexual activity: Yes    Partners: Female     Comment: wife   Other Topics Concern  . Not on file   Social History Narrative  . No narrative on file    Outpatient Medications Prior to Visit  Medication Sig Dispense Refill  . cetirizine (ZYRTEC) 5 MG tablet Take 5 mg by mouth daily.      . fluticasone (FLONASE) 50 MCG/ACT nasal spray Place 2 sprays into both nostrils daily as needed for allergies or rhinitis. 48 g 0  . losartan (COZAAR) 25 MG tablet Take 1 tablet (25 mg total) by mouth daily. 90 tablet 3  . montelukast (SINGULAIR) 10 MG tablet Take 1 tablet (10 mg total) by mouth at bedtime. 90 tablet 0  . naproxen sodium (ANAPROX) 220 MG tablet Take 220 mg by mouth as needed (for arthritis pain).     Facility-Administered Medications Prior to Visit  Medication Dose Route Frequency Provider Last Rate Last Dose  . 0.9 %  sodium chloride infusion  500 mL  Intravenous Continuous Sable Feil, MD        No Known Allergies  Review of Systems  Constitutional: Negative for fever and malaise/fatigue.  HENT: Negative for congestion.   Eyes: Negative for blurred vision.  Respiratory: Negative for shortness of breath.   Cardiovascular: Negative for chest pain, palpitations and leg swelling.  Gastrointestinal: Negative for abdominal pain, blood in stool and nausea.  Genitourinary: Negative for dysuria and frequency.  Musculoskeletal: Positive for back pain. Negative for falls.  Skin: Negative for rash.  Neurological: Negative for dizziness, loss of consciousness and headaches.  Endo/Heme/Allergies: Negative for environmental allergies.  Psychiatric/Behavioral: Negative for depression. The patient is not nervous/anxious.        Objective:    Physical Exam  Constitutional: He is oriented to person, place, and time. He appears well-developed and well-nourished. No distress.  HENT:  Head: Normocephalic and  atraumatic.  Eyes: Conjunctivae are normal.  Neck: Neck supple. No thyromegaly present.  Cardiovascular: Normal rate, regular rhythm and normal heart sounds.   No murmur heard. Pulmonary/Chest: Effort normal and breath sounds normal. No respiratory distress. He has no wheezes.  Abdominal: Soft. Bowel sounds are normal. He exhibits no mass. There is no tenderness.  Musculoskeletal: He exhibits no edema.  1-2 cm ,firm knot in muscle at top of scapula on right  Lymphadenopathy:    He has no cervical adenopathy.  Neurological: He is alert and oriented to person, place, and time.  Skin: Skin is warm and dry.  Psychiatric: He has a normal mood and affect. His behavior is normal.    BP 120/76 (BP Location: Left Arm, Patient Position: Sitting, Cuff Size: Normal)   Pulse 73   Temp 98.3 F (36.8 C) (Oral)   Ht 5\' 9"  (1.753 m)   Wt 172 lb 4 oz (78.1 kg)   SpO2 98%   BMI 25.44 kg/m  Wt Readings from Last 3 Encounters:  09/11/16 172 lb 4 oz (78.1 kg)  09/04/16 172 lb 4 oz (78.1 kg)  10/20/15 172 lb 12.8 oz (78.4 kg)     Lab Results  Component Value Date   WBC 8.9 10/20/2015   HGB 16.3 10/20/2015   HCT 48.1 10/20/2015   PLT 240.0 10/20/2015   GLUCOSE 92 10/20/2015   CHOL 168 10/20/2015   TRIG 52.0 10/20/2015   HDL 78.20 10/20/2015   LDLCALC 79 10/20/2015   ALT 19 10/20/2015   AST 22 10/20/2015   NA 140 10/20/2015   K 4.9 10/20/2015   CL 103 10/20/2015   CREATININE 0.95 10/20/2015   BUN 14 10/20/2015   CO2 29 10/20/2015   TSH 2.62 10/06/2014   PSA 2.73 10/20/2015   HGBA1C 5.4 10/20/2015    Lab Results  Component Value Date   TSH 2.62 10/06/2014   Lab Results  Component Value Date   WBC 8.9 10/20/2015   HGB 16.3 10/20/2015   HCT 48.1 10/20/2015   MCV 97.3 10/20/2015   PLT 240.0 10/20/2015   Lab Results  Component Value Date   NA 140 10/20/2015   K 4.9 10/20/2015   CO2 29 10/20/2015   GLUCOSE 92 10/20/2015   BUN 14 10/20/2015   CREATININE 0.95 10/20/2015    BILITOT 0.8 10/20/2015   ALKPHOS 56 10/20/2015   AST 22 10/20/2015   ALT 19 10/20/2015   PROT 7.1 10/20/2015   ALBUMIN 4.5 10/20/2015   CALCIUM 9.2 10/20/2015   GFR 86.70 10/20/2015   Lab Results  Component Value Date   CHOL  168 10/20/2015   Lab Results  Component Value Date   HDL 78.20 10/20/2015   Lab Results  Component Value Date   LDLCALC 79 10/20/2015   Lab Results  Component Value Date   TRIG 52.0 10/20/2015   Lab Results  Component Value Date   CHOLHDL 2 10/20/2015   Lab Results  Component Value Date   HGBA1C 5.4 10/20/2015       Assessment & Plan:   Problem List Items Addressed This Visit    TOBACCO ABUSE    Encouraged complete cessation. Discussed need to quit as relates to risk of numerous cancers, cardiac and pulmonary disease as well as neurologic complications. Counseled for greater than 3 minutes      Hypertension    Well controlled, no changes to meds. Encouraged heart healthy diet such as the DASH diet and exercise as tolerated.       Right-sided thoracic back pain    Encouraged moist heat and gentle stretching as tolerated. May try NSAIDs and prescription meds as directed and report if symptoms worsen or seek immediate care, Tizanidine tid prn, Lidocaine topical      Relevant Medications   tiZANidine (ZANAFLEX) 4 MG tablet      I am having Mr. Gale start on tiZANidine. I am also having him maintain his cetirizine, naproxen sodium, losartan, fluticasone, and montelukast. We will continue to administer sodium chloride.  Meds ordered this encounter  Medications  . tiZANidine (ZANAFLEX) 4 MG tablet    Sig: Take 1 tablet (4 mg total) by mouth every 8 (eight) hours as needed for muscle spasms.    Dispense:  40 tablet    Refill:  1     Penni Homans, MD

## 2016-09-11 NOTE — Assessment & Plan Note (Signed)
Encouraged complete cessation. Discussed need to quit as relates to risk of numerous cancers, cardiac and pulmonary disease as well as neurologic complications. Counseled for greater than 3 minutes 

## 2016-09-11 NOTE — Patient Instructions (Addendum)
Lidocaine gel or patch made by Jenel Lucks, Aspercreme or Icy Hot Moist heat  Back Pain, Adult Back pain is very common in adults.The cause of back pain is rarely dangerous and the pain often gets better over time.The cause of your back pain may not be known. Some common causes of back pain include:  Strain of the muscles or ligaments supporting the spine.  Wear and tear (degeneration) of the spinal disks.  Arthritis.  Direct injury to the back. For many people, back pain may return. Since back pain is rarely dangerous, most people can learn to manage this condition on their own. HOME CARE INSTRUCTIONS Watch your back pain for any changes. The following actions may help to lessen any discomfort you are feeling:  Remain active. It is stressful on your back to sit or stand in one place for long periods of time. Do not sit, drive, or stand in one place for more than 30 minutes at a time. Take short walks on even surfaces as soon as you are able.Try to increase the length of time you walk each day.  Exercise regularly as directed by your health care provider. Exercise helps your back heal faster. It also helps avoid future injury by keeping your muscles strong and flexible.  Do not stay in bed.Resting more than 1-2 days can delay your recovery.  Pay attention to your body when you bend and lift. The most comfortable positions are those that put less stress on your recovering back. Always use proper lifting techniques, including:  Bending your knees.  Keeping the load close to your body.  Avoiding twisting.  Find a comfortable position to sleep. Use a firm mattress and lie on your side with your knees slightly bent. If you lie on your back, put a pillow under your knees.  Avoid feeling anxious or stressed.Stress increases muscle tension and can worsen back pain.It is important to recognize when you are anxious or stressed and learn ways to manage it, such as with exercise.  Take  medicines only as directed by your health care provider. Over-the-counter medicines to reduce pain and inflammation are often the most helpful.Your health care provider may prescribe muscle relaxant drugs.These medicines help dull your pain so you can more quickly return to your normal activities and healthy exercise.  Apply ice to the injured area:  Put ice in a plastic bag.  Place a towel between your skin and the bag.  Leave the ice on for 20 minutes, 2-3 times a day for the first 2-3 days. After that, ice and heat may be alternated to reduce pain and spasms.  Maintain a healthy weight. Excess weight puts extra stress on your back and makes it difficult to maintain good posture. SEEK MEDICAL CARE IF:  You have pain that is not relieved with rest or medicine.  You have increasing pain going down into the legs or buttocks.  You have pain that does not improve in one week.  You have night pain.  You lose weight.  You have a fever or chills. SEEK IMMEDIATE MEDICAL CARE IF:   You develop new bowel or bladder control problems.  You have unusual weakness or numbness in your arms or legs.  You develop nausea or vomiting.  You develop abdominal pain.  You feel faint.   This information is not intended to replace advice given to you by your health care provider. Make sure you discuss any questions you have with your health care provider.  Document Released: 10/23/2005 Document Revised: 11/13/2014 Document Reviewed: 02/24/2014 Elsevier Interactive Patient Education [employer].

## 2016-09-11 NOTE — Progress Notes (Signed)
Pre visit review using our clinic review tool, if applicable. No additional management support is needed unless otherwise documented below in the visit note. 

## 2016-09-11 NOTE — Assessment & Plan Note (Signed)
Encouraged moist heat and gentle stretching as tolerated. May try NSAIDs and prescription meds as directed and report if symptoms worsen or seek immediate care, Tizanidine tid prn, Lidocaine topical

## 2016-09-17 NOTE — Assessment & Plan Note (Signed)
Well controlled, no changes to meds. Encouraged heart healthy diet such as the DASH diet and exercise as tolerated.  °

## 2016-09-22 ENCOUNTER — Ambulatory Visit (INDEPENDENT_AMBULATORY_CARE_PROVIDER_SITE_OTHER): Payer: Managed Care, Other (non HMO) | Admitting: Psychology

## 2016-09-22 DIAGNOSIS — F4322 Adjustment disorder with anxiety: Secondary | ICD-10-CM

## 2016-10-10 ENCOUNTER — Ambulatory Visit: Payer: Managed Care, Other (non HMO) | Admitting: *Deleted

## 2016-10-10 ENCOUNTER — Encounter: Payer: Self-pay | Admitting: Internal Medicine

## 2016-10-10 VITALS — Ht 69.0 in | Wt 169.4 lb

## 2016-10-10 DIAGNOSIS — Z86010 Personal history of colonic polyps: Secondary | ICD-10-CM

## 2016-10-10 MED ORDER — SUPREP BOWEL PREP KIT 17.5-3.13-1.6 GM/177ML PO SOLN: 1.0000 | Freq: Once | ORAL | 0 refills | Status: AC

## 2016-10-10 NOTE — Progress Notes (Signed)
Patient denies any allergies to egg or soy products. Patient denies complications with anesthesia/sedation.  Patient denies oxygen use at home and denies diet medications. Emmi instructions for colonoscopy explained but patient denied.     

## 2016-10-13 ENCOUNTER — Ambulatory Visit (INDEPENDENT_AMBULATORY_CARE_PROVIDER_SITE_OTHER): Payer: Managed Care, Other (non HMO) | Admitting: Psychology

## 2016-10-13 DIAGNOSIS — F4322 Adjustment disorder with anxiety: Secondary | ICD-10-CM

## 2016-10-18 ENCOUNTER — Ambulatory Visit (AMBULATORY_SURGERY_CENTER): Payer: Managed Care, Other (non HMO) | Admitting: Internal Medicine

## 2016-10-18 ENCOUNTER — Encounter: Payer: Self-pay | Admitting: Internal Medicine

## 2016-10-18 VITALS — BP 121/53 | HR 60 | Temp 97.3°F | Resp 13 | Ht 69.0 in | Wt 169.0 lb

## 2016-10-18 DIAGNOSIS — D122 Benign neoplasm of ascending colon: Secondary | ICD-10-CM

## 2016-10-18 DIAGNOSIS — D120 Benign neoplasm of cecum: Secondary | ICD-10-CM

## 2016-10-18 DIAGNOSIS — K635 Polyp of colon: Secondary | ICD-10-CM

## 2016-10-18 DIAGNOSIS — K621 Rectal polyp: Secondary | ICD-10-CM

## 2016-10-18 DIAGNOSIS — Z86010 Personal history of colonic polyps: Secondary | ICD-10-CM | POA: Diagnosis not present

## 2016-10-18 DIAGNOSIS — D128 Benign neoplasm of rectum: Secondary | ICD-10-CM

## 2016-10-18 DIAGNOSIS — D123 Benign neoplasm of transverse colon: Secondary | ICD-10-CM | POA: Diagnosis not present

## 2016-10-18 DIAGNOSIS — Z800 Family history of malignant neoplasm of digestive organs: Secondary | ICD-10-CM

## 2016-10-18 DIAGNOSIS — D127 Benign neoplasm of rectosigmoid junction: Secondary | ICD-10-CM

## 2016-10-18 DIAGNOSIS — D129 Benign neoplasm of anus and anal canal: Secondary | ICD-10-CM

## 2016-10-18 MED ORDER — SODIUM CHLORIDE 0.9 % IV SOLN: 500.0000 mL | INTRAVENOUS | Status: DC

## 2016-10-18 NOTE — Patient Instructions (Signed)
YOU HAD AN ENDOSCOPIC PROCEDURE TODAY AT THE [facility]:   Refer to the procedure report that was given to you for any specific questions about what was found during the examination.  If the procedure report does not answer your questions, please call your gastroenterologist to clarify.  If you requested that your care partner not be given the details of your procedure findings, then the procedure report has been included in a sealed envelope for you to review at your convenience later.  YOU SHOULD EXPECT: Some feelings of bloating in the abdomen. Passage of more gas than usual.  Walking can help get rid of the air that was put into your GI tract during the procedure and reduce the bloating. If you had a lower endoscopy (such as a colonoscopy or flexible sigmoidoscopy) you may notice spotting of blood in your stool or on the toilet paper. If you underwent a bowel prep for your procedure, you may not have a normal bowel movement for a few days.  Please Note:  You might notice some irritation and congestion in your nose or some drainage.  This is from the oxygen used during your procedure.  There is no need for concern and it should clear up in a day or so.  SYMPTOMS TO REPORT IMMEDIATELY:   Following lower endoscopy (colonoscopy or flexible sigmoidoscopy):  Excessive amounts of blood in the stool  Significant tenderness or worsening of abdominal pains  Swelling of the abdomen that is new, acute  Fever of 100F or higher   For urgent or emergent issues, a gastroenterologist can be reached at any hour by calling [phone]. Please read all handouts given to you by your recovery nurse.   DIET:  We do recommend a small meal at first, but then you may proceed to your regular diet.  Drink plenty of fluids but you should avoid alcoholic beverages for 24 hours.  ACTIVITY:  You should plan to take it easy for the rest of today and you should NOT DRIVE or use heavy machinery until  tomorrow (because of the sedation medicines used during the test).    FOLLOW UP: Our staff will call the number listed on your records the next business day following your procedure to check on you and address any questions or concerns that you may have regarding the information given to you following your procedure. If we do not reach you, we will leave a message.  However, if you are feeling well and you are not experiencing any problems, there is no need to return our call.  We will assume that you have returned to your regular daily activities without incident.  If any biopsies were taken you will be contacted by phone or by letter within the next 1-3 weeks.  Please call us at [phone] if you have not heard about the biopsies in 3 weeks.    SIGNATURES/CONFIDENTIALITY: You and/or your care partner have signed paperwork which will be entered into your electronic medical record.  These signatures attest to the fact that that the information above on your After Visit Summary has been reviewed and is understood.  Full responsibility of the confidentiality of this discharge information lies with you and/or your care-partner.  Thank you for letting us take care of your healthcare needs today. 

## 2016-10-18 NOTE — Progress Notes (Signed)
A/ox3 pleased with MAC, report to Sarah RN 

## 2016-10-18 NOTE — Op Note (Signed)
[name] Procedure Date: 10/18/2016 2:35 PM MRN: [id number] Endoscopist: Jerene Bears , MD Age: 60 Referring MD:  Date of Birth: 1956-08-01 Gender: Male Account #: [id number] Procedure:                Colonoscopy Indications:              Surveillance: Personal history of adenomatous                            polyps on last colonoscopy 5 years ago, Family                            history of colon cancer in a first-degree relative Medicines:                Monitored Anesthesia Care Procedure:                Pre-Anesthesia Assessment:                           - Prior to the procedure, a History and Physical                            was performed, and patient medications and                            allergies were reviewed. The patient's tolerance of                            previous anesthesia was also reviewed. The risks                            and benefits of the procedure and the sedation                            options and risks were discussed with the patient.                            All questions were answered, and informed consent                            was obtained. Prior Anticoagulants: The patient has                            taken no previous anticoagulant or antiplatelet                            agents. ASA Grade Assessment: II - A patient with                            mild systemic disease. After reviewing the risks                            and benefits, the patient was deemed in  satisfactory condition to undergo the procedure.                           After obtaining informed consent, the colonoscope                            was passed under direct vision. Throughout the                            procedure, the patient's blood pressure, pulse, and                            oxygen saturations were monitored continuously. The                            Model PCF-H190DL  [id number]) scope was introduced                            through the anus and advanced to the the cecum,                            identified by appendiceal orifice and ileocecal                            valve. The colonoscopy was performed without                            difficulty. The patient tolerated the procedure                            well. The quality of the bowel preparation was                            good. The ileocecal valve, appendiceal orifice, and                            rectum were photographed. Scope In: 2:47:26 PM Scope Out: 3:09:08 PM Scope Withdrawal Time: 0 hours 16 minutes 50 seconds  Total Procedure Duration: 0 hours 21 minutes 42 seconds  Findings:                 The perianal and digital rectal examinations were                            normal.                           Two sessile polyps were found in the cecum. The                            polyps were 4 to 5 mm in size. These polyps were                            removed with a cold snare. Resection and retrieval  were complete.                           A 7 mm polyp was found in the ascending colon. The                            polyp was sessile. The polyp was removed with a                            cold snare. Resection and retrieval were complete.                           A 3 mm polyp was found in the splenic flexure. The                            polyp was sessile. The polyp was removed with a                            cold snare. Resection and retrieval were complete.                           Three sessile polyps were found in the rectum and                            recto-sigmoid colon. The polyps were 3 to 5 mm in                            size. These polyps were removed with a cold snare.                            Resection and retrieval were complete.                           Multiple small and large-mouthed diverticula were                             found from cecum to sigmoid colon.                           Internal hemorrhoids were found during                            retroflexion. The hemorrhoids were small. Complications:            No immediate complications. Estimated Blood Loss:     Estimated blood loss was minimal. Impression:               - Two 4 to 5 mm polyps in the cecum, removed with a                            cold snare. Resected and retrieved.                           - One  7 mm polyp in the ascending colon, removed                            with a cold snare. Resected and retrieved.                           - One 3 mm polyp at the splenic flexure, removed                            with a cold snare. Resected and retrieved.                           - Three 3 to 5 mm polyps in the rectum and at the                            recto-sigmoid colon, removed with a cold snare.                            Resected and retrieved.                           - Moderate diverticulosis from cecum to sigmoid                            colon.                           - Internal hemorrhoids. Recommendation:           - Patient has a contact number available for                            emergencies. The signs and symptoms of potential                            delayed complications were discussed with the                            patient. Return to normal activities tomorrow.                            Written discharge instructions were provided to the                            patient.                           - Resume previous diet.                           - Continue present medications.                           - Await pathology results.                           -  Repeat colonoscopy is recommended for                            surveillance. The colonoscopy date will be                            determined after pathology results from today's                            exam become available for  review. Jerene Bears, MD 10/18/2016 3:13:55 PM This report has been signed electronically.

## 2016-10-18 NOTE — Progress Notes (Signed)
Called to room to assist during endoscopic procedure.  Patient ID and intended procedure confirmed with present staff. Received instructions for my participation in the procedure from the performing physician.  

## 2016-10-19 ENCOUNTER — Telehealth: Payer: Self-pay

## 2016-10-19 NOTE — Telephone Encounter (Signed)
  Follow up Call-  Call back number 10/18/2016  Post procedure Call Back phone  # [phone]  Permission to leave phone message Yes  Some recent data might be hidden     Patient questions:  Do you have a fever, pain , or abdominal swelling? No. Pain Score  0 *  Have you tolerated food without any problems? Yes.    Have you been able to return to your normal activities? Yes.    Do you have any questions about your discharge instructions: Diet   No. Medications  No. Follow up visit  No.  Do you have questions or concerns about your Care? No.  Actions: * If pain score is 4 or above: No action needed, pain <4.

## 2016-10-23 ENCOUNTER — Other Ambulatory Visit: Payer: Self-pay | Admitting: Cardiovascular Disease

## 2016-10-23 ENCOUNTER — Other Ambulatory Visit: Payer: Self-pay | Admitting: Physician Assistant

## 2016-10-23 DIAGNOSIS — J302 Other seasonal allergic rhinitis: Secondary | ICD-10-CM

## 2016-10-23 DIAGNOSIS — I359 Nonrheumatic aortic valve disorder, unspecified: Secondary | ICD-10-CM

## 2016-10-25 ENCOUNTER — Ambulatory Visit (INDEPENDENT_AMBULATORY_CARE_PROVIDER_SITE_OTHER): Payer: Managed Care, Other (non HMO) | Admitting: Psychology

## 2016-10-25 DIAGNOSIS — F4322 Adjustment disorder with anxiety: Secondary | ICD-10-CM | POA: Diagnosis not present

## 2016-10-27 ENCOUNTER — Encounter: Payer: Self-pay | Admitting: Internal Medicine

## 2016-11-14 ENCOUNTER — Ambulatory Visit (INDEPENDENT_AMBULATORY_CARE_PROVIDER_SITE_OTHER): Payer: Managed Care, Other (non HMO) | Admitting: Psychology

## 2016-11-14 DIAGNOSIS — F4322 Adjustment disorder with anxiety: Secondary | ICD-10-CM

## 2016-11-28 ENCOUNTER — Ambulatory Visit: Payer: Managed Care, Other (non HMO) | Admitting: Psychology

## 2016-12-12 ENCOUNTER — Ambulatory Visit (INDEPENDENT_AMBULATORY_CARE_PROVIDER_SITE_OTHER): Payer: Managed Care, Other (non HMO) | Admitting: Psychology

## 2016-12-12 DIAGNOSIS — F4322 Adjustment disorder with anxiety: Secondary | ICD-10-CM

## 2016-12-29 ENCOUNTER — Ambulatory Visit (INDEPENDENT_AMBULATORY_CARE_PROVIDER_SITE_OTHER): Payer: Managed Care, Other (non HMO) | Admitting: Physician Assistant

## 2016-12-29 ENCOUNTER — Encounter: Payer: Self-pay | Admitting: Physician Assistant

## 2016-12-29 VITALS — BP 140/60 | HR 64 | Temp 98.3°F | Resp 14 | Ht 69.0 in | Wt 171.0 lb

## 2016-12-29 DIAGNOSIS — I10 Essential (primary) hypertension: Secondary | ICD-10-CM | POA: Diagnosis not present

## 2016-12-29 DIAGNOSIS — Z0000 Encounter for general adult medical examination without abnormal findings: Secondary | ICD-10-CM

## 2016-12-29 DIAGNOSIS — Z23 Encounter for immunization: Secondary | ICD-10-CM

## 2016-12-29 DIAGNOSIS — Z125 Encounter for screening for malignant neoplasm of prostate: Secondary | ICD-10-CM

## 2016-12-29 LAB — CBC
HCT: 46.8 % (ref 39.0–52.0)
HEMOGLOBIN: 16 g/dL (ref 13.0–17.0)
MCHC: 34.2 g/dL (ref 30.0–36.0)
MCV: 96.5 fl (ref 78.0–100.0)
PLATELETS: 237 10*3/uL (ref 150.0–400.0)
RBC: 4.85 Mil/uL (ref 4.22–5.81)
RDW: 13.3 % (ref 11.5–15.5)
WBC: 8.9 10*3/uL (ref 4.0–10.5)

## 2016-12-29 LAB — LIPID PANEL
CHOL/HDL RATIO: 2
CHOLESTEROL: 163 mg/dL (ref 0–200)
HDL: 76.8 mg/dL (ref >39.00)
LDL Cholesterol: 75 mg/dL (ref 0–99)
NonHDL: 85.91
TRIGLYCERIDES: 55 mg/dL (ref 0.0–149.0)
VLDL: 11 mg/dL (ref 0.0–40.0)

## 2016-12-29 LAB — COMPREHENSIVE METABOLIC PANEL
ALBUMIN: 4.6 g/dL (ref 3.5–5.2)
ALK PHOS: 55 U/L (ref 39–117)
ALT: 15 U/L (ref 0–53)
AST: 20 U/L (ref 0–37)
BILIRUBIN TOTAL: 0.7 mg/dL (ref 0.2–1.2)
BUN: 12 mg/dL (ref 6–23)
CO2: 30 meq/L (ref 19–32)
CREATININE: 0.87 mg/dL (ref 0.40–1.50)
Calcium: 9.5 mg/dL (ref 8.4–10.5)
Chloride: 105 mEq/L (ref 96–112)
GFR: 95.57 mL/min (ref >60.00)
Glucose, Bld: 85 mg/dL (ref 70–99)
Potassium: 4.9 mEq/L (ref 3.5–5.1)
Sodium: 140 mEq/L (ref 135–145)
TOTAL PROTEIN: 6.9 g/dL (ref 6.0–8.3)

## 2016-12-29 LAB — URINALYSIS, ROUTINE W REFLEX MICROSCOPIC
BILIRUBIN URINE: NEGATIVE
Hgb urine dipstick: NEGATIVE
KETONES UR: NEGATIVE
NITRITE: NEGATIVE
RBC / HPF: NONE SEEN (ref 0–?)
SPECIFIC GRAVITY, URINE: 1.02 (ref 1.000–1.030)
Total Protein, Urine: NEGATIVE
URINE GLUCOSE: NEGATIVE
UROBILINOGEN UA: 0.2 (ref 0.0–1.0)
pH: 6 (ref 5.0–8.0)

## 2016-12-29 LAB — HEMOGLOBIN A1C: Hgb A1c MFr Bld: 5.5 % (ref 4.6–6.5)

## 2016-12-29 LAB — PSA: PSA: 3.64 ng/mL (ref 0.10–4.00)

## 2016-12-29 NOTE — Progress Notes (Signed)
Patient presents to clinic today for annual exam.  Patient is fasting for labs. Body mass index is 25.25 kg/m. Is trying to exercise a few times out of the week. Endorses well-balanced diet overall.  Acute Concerns: Denies acute concerns today.  Chronic Issues: Hypertension -- + history of mitral valve disorder. Is followed by Cardiology [name]). Is due for follow-up. Will be having a cardiac MRI. Patient endorses taking BP medications as directed without side effect. Patient denies chest pain, palpitations, lightheadedness, dizziness, vision changes or frequent headaches. Has taken medication this morning just before interview and examination.  BP Readings from Last 3 Encounters:  12/29/16 140/60  10/18/16 (!) 121/53  09/11/16 120/76   Health Maintenance: Immunizations -- Up-to-date on flu shot. Due for TDaP today. Agrees to have. Colonoscopy --Up-to-date. Last in 2017. Multiple pre-cancerous polyps. Is on 3-year colonoscopy schedule.  Patient will check on coverage for Hep C screening.  Past Medical History:  Diagnosis Date  . Allergy   . Arthritis    hand, neck  . Back pain 09/11/2016   patient denies back pain but has neck pain  . Bicuspid aortic valve   . DEGENERATIVE DISC DISEASE, CERVICAL SPINE   . Heart murmur    never has caused any problems  . History of chicken pox   . Hypertension   . Migraines    last one 2 wks ago- allergy related   . MITRAL VALVE PROLAPSE   . PSA, INCREASED   . TINNITUS, CHRONIC, BILATERAL     Past Surgical History:  Procedure Laterality Date  . COLONOSCOPY  2012   [place] hx polyps  . CYST REMOVAL NECK     And Face  . POLYPECTOMY     Colon  . Tonsillectome    . WISDOM TOOTH EXTRACTION      Current Outpatient Prescriptions on File Prior to Visit  Medication Sig Dispense Refill  . cetirizine (ZYRTEC) 5 MG tablet Take 5 mg by mouth daily.      . fluticasone (FLONASE) 50 MCG/ACT nasal spray USE 2 SPRAYS IN EACH NOSTRIL DAILY  AS NEEDED FOR ALLERGIES OR RHINITIS 48 g 0  . ibuprofen (ADVIL,MOTRIN) 200 MG tablet Take 200 mg by mouth every 6 (six) hours as needed.    Marland Kitchen losartan (COZAAR) 25 MG tablet Take 1 tablet (25 mg total) by mouth daily. *Patient is overdue for an appointment. Please call and schedule for further refills* 90 tablet 0  . montelukast (SINGULAIR) 10 MG tablet TAKE 1 TABLET AT BEDTIME 90 tablet 0  . naproxen sodium (ANAPROX) 220 MG tablet Take 220 mg by mouth as needed (for arthritis pain).     No current facility-administered medications on file prior to visit.     No Known Allergies  Family History  Problem Relation Age of Onset  . Colon cancer Mother 71    Deceased  . Lung cancer Mother   . Liver cancer Mother   . Breast cancer Sister   . Lymphoma Father 34    Deceased  . Diabetes Father   . Cancer Other     Paternal Grandparents  . Cancer Other     Maternal Grandparents  . Emphysema Paternal Grandfather   . Emphysema Maternal Grandfather   . Cancer Paternal Aunt   . Cancer Maternal Aunt   . Esophageal cancer Neg Hx   . Stomach cancer Neg Hx    Social History   Social History  . Marital status: Married  Spouse name: N/A  . Number of children: N/A  . Years of education: N/A   Occupational History  . Not on file.   Social History Main Topics  . Smoking status: Current Every Day Smoker    Packs/day: 0.50    Years: 40.00    Types: Cigarettes  . Smokeless tobacco: Never Used  . Alcohol use 4.2 oz/week    7 Standard drinks or equivalent per week     Comment: 1 glass of scotch daily  . Drug use: No  . Sexual activity: Yes    Partners: Female     Comment: wife   Other Topics Concern  . Not on file   Social History Narrative  . No narrative on file   Review of Systems  Constitutional: Negative for fever and weight loss.  HENT: Negative for ear discharge, ear pain, hearing loss and tinnitus.   Eyes: Negative for blurred vision, double vision, photophobia and pain.   Respiratory: Negative for cough and shortness of breath.   Cardiovascular: Negative for chest pain and palpitations.  Gastrointestinal: Negative for abdominal pain, blood in stool, constipation, diarrhea, heartburn, melena, nausea and vomiting.  Genitourinary: Negative for dysuria, flank pain, frequency, hematuria and urgency.  Musculoskeletal: Negative for falls.  Neurological: Negative for dizziness, loss of consciousness and headaches.  Endo/Heme/Allergies: Negative for environmental allergies.  Psychiatric/Behavioral: Negative for depression, hallucinations, substance abuse and suicidal ideas. The patient is not nervous/anxious and does not have insomnia.    BP 140/60   Pulse 64   Temp 98.3 F (36.8 C) (Oral)   Resp 14   Ht 5\' 9"  (1.753 m)   Wt 171 lb (77.6 kg)   SpO2 98%   BMI 25.25 kg/m   Physical Exam  Constitutional: He is oriented to person, place, and time and well-developed, well-nourished, and in no distress.  HENT:  Head: Normocephalic and atraumatic.  Right Ear: External ear normal.  Left Ear: External ear normal.  Nose: Nose normal.  Mouth/Throat: Oropharynx is clear and moist. No oropharyngeal exudate.  Eyes: Conjunctivae and EOM are normal. Pupils are equal, round, and reactive to light.  Neck: Neck supple. No thyromegaly present.  Cardiovascular: Normal rate, regular rhythm, normal heart sounds and intact distal pulses.   Pulmonary/Chest: Effort normal and breath sounds normal. No respiratory distress. He has no wheezes. He has no rales. He exhibits no tenderness.  Abdominal: Soft. Bowel sounds are normal. He exhibits no distension and no mass. There is no tenderness. There is no rebound and no guarding.  Genitourinary: Testes/scrotum normal.  Lymphadenopathy:    He has no cervical adenopathy.  Neurological: He is alert and oriented to person, place, and time.  Skin: Skin is warm and dry. No rash noted.  Psychiatric: Affect normal.  Vitals  reviewed.   Assessment/Plan: Hypertension Followed by Cardiology.  BP previously well-controlled on current regimen. Mild elevation today. Asymptomatic. Patient has only just taken medications. Improved on recheck. Will continue current regimen until FU with Cardiology. Will obtain labs today.  Prostate cancer screening The natural history of prostate cancer and ongoing controversy regarding screening and potential treatment outcomes of prostate cancer has been discussed with the patient. The meaning of a false positive PSA and a false negative PSA has been discussed. He indicates understanding of the limitations of this screening test and wishes  to proceed with screening PSA testing.   Visit for preventive health examination Depression screen negative. Health Maintenance reviewed -- TDaP updated today. Colonoscopy due again in  3 years. PSA today after discussion of risks and benefits. Patient to check on coverage for Hep C screening.. Preventive schedule discussed and handout given in AVS. Will obtain fasting labs today.      [name], PA-C

## 2016-12-29 NOTE — Patient Instructions (Signed)
Please go to the lab for blood work.   Our office will call you with your results unless you have chosen to receive results via MyChart.  If your blood work is normal we will follow-up each year for physicals and as scheduled for chronic medical problems.  If anything is abnormal we will treat accordingly and get you in for a follow-up.  Please continue BP medications as directed.  Follow the diet below.  DASH Eating Plan DASH stands for "Dietary Approaches to Stop Hypertension." The DASH eating plan is a healthy eating plan that has been shown to reduce high blood pressure (hypertension). Additional health benefits may include reducing the risk of type 2 diabetes mellitus, heart disease, and stroke. The DASH eating plan may also help with weight loss. What do I need to know about the DASH eating plan? For the DASH eating plan, you will follow these general guidelines:  Choose foods with less than 150 milligrams of sodium per serving (as listed on the food label).  Use salt-free seasonings or herbs instead of table salt or sea salt.  Check with your health care provider or pharmacist before using salt substitutes.  Eat lower-sodium products. These are often labeled as "low-sodium" or "no salt added."  Eat fresh foods. Avoid eating a lot of canned foods.  Eat more vegetables, fruits, and low-fat dairy products.  Choose whole grains. Look for the word "whole" as the first word in the ingredient list.  Choose fish and skinless chicken or [place] more often than red meat. Limit fish, poultry, and meat to 6 oz (170 g) each day.  Limit sweets, desserts, sugars, and sugary drinks.  Choose heart-healthy fats.  Eat more home-cooked food and less restaurant, buffet, and fast food.  Limit fried foods.  Do not fry foods. Cook foods using methods such as baking, boiling, grilling, and broiling instead.  When eating at a restaurant, ask that your food be prepared with less salt, or no salt  if possible. What foods can I eat? Seek help from a dietitian for individual calorie needs. Grains  Whole grain or whole wheat bread. Brown rice. Whole grain or whole wheat pasta. Quinoa, bulgur, and whole grain cereals. Low-sodium cereals. Corn or whole wheat flour tortillas. Whole grain cornbread. Whole grain crackers. Low-sodium crackers. Vegetables  Fresh or frozen vegetables (raw, steamed, roasted, or grilled). Low-sodium or reduced-sodium tomato and vegetable juices. Low-sodium or reduced-sodium tomato sauce and paste. Low-sodium or reduced-sodium canned vegetables. Fruits  All fresh, canned (in natural juice), or frozen fruits. Meat and Other Protein Products  Ground beef (85% or leaner), grass-fed beef, or beef trimmed of fat. Skinless chicken or [place]. Ground chicken or [place]. Pork trimmed of fat. All fish and seafood. Eggs. Dried beans, peas, or lentils. Unsalted nuts and seeds. Unsalted canned beans. Dairy  Low-fat dairy products, such as skim or 1% milk, 2% or reduced-fat cheeses, low-fat ricotta or cottage cheese, or plain low-fat yogurt. Low-sodium or reduced-sodium cheeses. Fats and Oils  Tub margarines without trans fats. Light or reduced-fat mayonnaise and salad dressings (reduced sodium). Avocado. Safflower, olive, or canola oils. Natural peanut or almond butter. Other  Unsalted popcorn and pretzels. The items listed above may not be a complete list of recommended foods or beverages. Contact your dietitian for more options.  What foods are not recommended? Grains  White bread. White pasta. White rice. Refined cornbread. Bagels and croissants. Crackers that contain trans fat. Vegetables  Creamed or fried vegetables. Vegetables in a cheese  sauce. Regular canned vegetables. Regular canned tomato sauce and paste. Regular tomato and vegetable juices. Fruits  Canned fruit in light or heavy syrup. Fruit juice. Meat and Other Protein Products  Fatty cuts of meat. Ribs, chicken  wings, bacon, sausage, bologna, salami, chitterlings, fatback, hot dogs, bratwurst, and packaged luncheon meats. Salted nuts and seeds. Canned beans with salt. Dairy  Whole or 2% milk, cream, half-and-half, and cream cheese. Whole-fat or sweetened yogurt. Full-fat cheeses or blue cheese. Nondairy creamers and whipped toppings. Processed cheese, cheese spreads, or cheese curds. Condiments  Onion and garlic salt, seasoned salt, table salt, and sea salt. Canned and packaged gravies. Worcestershire sauce. Tartar sauce. Barbecue sauce. Teriyaki sauce. Soy sauce, including reduced sodium. Steak sauce. Fish sauce. Oyster sauce. Cocktail sauce. Horseradish. Ketchup and mustard. Meat flavorings and tenderizers. Bouillon cubes. Hot sauce. Tabasco sauce. Marinades. Taco seasonings. Relishes. Fats and Oils  Butter, stick margarine, lard, shortening, ghee, and bacon fat. Coconut, palm kernel, or palm oils. Regular salad dressings. Other  Pickles and olives. Salted popcorn and pretzels. The items listed above may not be a complete list of foods and beverages to avoid. Contact your dietitian for more information.  Where can I find more information? National Heart, Lung, and Blood Institute: travelstabloid.com This information is not intended to replace advice given to you by your health care provider. Make sure you discuss any questions you have with your health care provider. Document Released: 10/12/2011 Document Revised: 03/30/2016 Document Reviewed: 08/27/2013 Elsevier Interactive Patient Education  2017 [place] Years, Male Preventive care refers to lifestyle choices and visits with your health care provider that can promote health and wellness. What does preventive care include?  A yearly physical exam. This is also called an annual well check.  Dental exams once or twice a year.  Routine eye exams. Ask your health care provider  how often you should have your eyes checked.  Personal lifestyle choices, including:  Daily care of your teeth and gums.  Regular physical activity.  Eating a healthy diet.  Avoiding tobacco and drug use.  Limiting alcohol use.  Practicing safe sex.  Taking low-dose aspirin every day starting at age 32. What happens during an annual well check? The services and screenings done by your health care provider during your annual well check will depend on your age, overall health, lifestyle risk factors, and family history of disease. Counseling  Your health care provider may ask you questions about your:  Alcohol use.  Tobacco use.  Drug use.  Emotional well-being.  Home and relationship well-being.  Sexual activity.  Eating habits.  Work and work Statistician. Screening  You may have the following tests or measurements:  Height, weight, and BMI.  Blood pressure.  Lipid and cholesterol levels. These may be checked every 5 years, or more frequently if you are over 24 years old.  Skin check.  Lung cancer screening. You may have this screening every year starting at age 12 if you have a 30-pack-year history of smoking and currently smoke or have quit within the past 15 years.  Fecal occult blood test (FOBT) of the stool. You may have this test every year starting at age 34.  Flexible sigmoidoscopy or colonoscopy. You may have a sigmoidoscopy every 5 years or a colonoscopy every 10 years starting at age 110.  Prostate cancer screening. Recommendations will vary depending on your family history and other risks.  Hepatitis C blood test.  Hepatitis B blood test.  Sexually transmitted disease (STD) testing.  Diabetes screening. This is done by checking your blood sugar (glucose) after you have not eaten for a while (fasting). You may have this done every 1-3 years. Discuss your test results, treatment options, and if necessary, the need for more tests with your health  care provider. Vaccines  Your health care provider may recommend certain vaccines, such as:  Influenza vaccine. This is recommended every year.  Tetanus, diphtheria, and acellular pertussis (Tdap, Td) vaccine. You may need a Td booster every 10 years.  Varicella vaccine. You may need this if you have not been vaccinated.  Zoster vaccine. You may need this after age 45.  Measles, mumps, and rubella (MMR) vaccine. You may need at least one dose of MMR if you were born in 1957 or later. You may also need a second dose.  Pneumococcal 13-valent conjugate (PCV13) vaccine. You may need this if you have certain conditions and have not been vaccinated.  Pneumococcal polysaccharide (PPSV23) vaccine. You may need one or two doses if you smoke cigarettes or if you have certain conditions.  Meningococcal vaccine. You may need this if you have certain conditions.  Hepatitis A vaccine. You may need this if you have certain conditions or if you travel or work in places where you may be exposed to hepatitis A.  Hepatitis B vaccine. You may need this if you have certain conditions or if you travel or work in places where you may be exposed to hepatitis B.  Haemophilus influenzae type b (Hib) vaccine. You may need this if you have certain risk factors. Talk to your health care provider about which screenings and vaccines you need and how often you need them. This information is not intended to replace advice given to you by your health care provider. Make sure you discuss any questions you have with your health care provider. Document Released: 11/19/2015 Document Revised: 07/12/2016 Document Reviewed: 08/24/2015 Elsevier Interactive Patient Education  2017 [employer].

## 2016-12-29 NOTE — Progress Notes (Signed)
Pre visit review using our clinic review tool, if applicable. No additional management support is needed unless otherwise documented below in the visit note. 

## 2016-12-31 ENCOUNTER — Encounter: Payer: Self-pay | Admitting: Physician Assistant

## 2016-12-31 NOTE — Assessment & Plan Note (Signed)
Followed by Cardiology.  BP previously well-controlled on current regimen. Mild elevation today. Asymptomatic. Patient has only just taken medications. Improved on recheck. Will continue current regimen until FU with Cardiology. Will obtain labs today.

## 2016-12-31 NOTE — Assessment & Plan Note (Signed)
The natural history of prostate cancer and ongoing controversy regarding screening and potential treatment outcomes of prostate cancer has been discussed with the patient. The meaning of a false positive PSA and a false negative PSA has been discussed. He indicates understanding of the limitations of this screening test and wishes  to proceed with screening PSA testing.  

## 2016-12-31 NOTE — Assessment & Plan Note (Signed)
Depression screen negative. Health Maintenance reviewed -- TDaP updated today. Colonoscopy due again in 3 years. PSA today after discussion of risks and benefits. Patient to check on coverage for Hep C screening.. Preventive schedule discussed and handout given in AVS. Will obtain fasting labs today.

## 2017-01-01 ENCOUNTER — Encounter: Payer: Self-pay | Admitting: Physician Assistant

## 2017-01-01 ENCOUNTER — Ambulatory Visit (INDEPENDENT_AMBULATORY_CARE_PROVIDER_SITE_OTHER): Payer: Managed Care, Other (non HMO) | Admitting: Psychology

## 2017-01-01 DIAGNOSIS — F4323 Adjustment disorder with mixed anxiety and depressed mood: Secondary | ICD-10-CM | POA: Diagnosis not present

## 2017-01-02 ENCOUNTER — Other Ambulatory Visit: Payer: Self-pay | Admitting: Physician Assistant

## 2017-01-02 DIAGNOSIS — R8290 Unspecified abnormal findings in urine: Secondary | ICD-10-CM

## 2017-01-02 DIAGNOSIS — R972 Elevated prostate specific antigen [PSA]: Secondary | ICD-10-CM

## 2017-01-05 ENCOUNTER — Other Ambulatory Visit (INDEPENDENT_AMBULATORY_CARE_PROVIDER_SITE_OTHER): Payer: Managed Care, Other (non HMO)

## 2017-01-05 DIAGNOSIS — R82998 Other abnormal findings in urine: Secondary | ICD-10-CM

## 2017-01-05 DIAGNOSIS — R8290 Unspecified abnormal findings in urine: Secondary | ICD-10-CM | POA: Diagnosis not present

## 2017-01-05 DIAGNOSIS — R8299 Other abnormal findings in urine: Secondary | ICD-10-CM | POA: Diagnosis not present

## 2017-01-05 LAB — POCT URINALYSIS DIPSTICK
BILIRUBIN UA: NEGATIVE
GLUCOSE UA: NEGATIVE
Ketones, UA: NEGATIVE
NITRITE UA: NEGATIVE
Protein, UA: NEGATIVE
RBC UA: NEGATIVE
SPEC GRAV UA: 1.015
Urobilinogen, UA: 0.2
pH, UA: 6

## 2017-01-06 LAB — URINE CULTURE: ORGANISM ID, BACTERIA: NO GROWTH

## 2017-01-15 ENCOUNTER — Ambulatory Visit (INDEPENDENT_AMBULATORY_CARE_PROVIDER_SITE_OTHER): Payer: Managed Care, Other (non HMO) | Admitting: Psychology

## 2017-01-15 DIAGNOSIS — F4323 Adjustment disorder with mixed anxiety and depressed mood: Secondary | ICD-10-CM

## 2017-02-14 ENCOUNTER — Other Ambulatory Visit: Payer: Self-pay | Admitting: Physician Assistant

## 2017-02-14 DIAGNOSIS — J302 Other seasonal allergic rhinitis: Secondary | ICD-10-CM

## 2017-02-16 ENCOUNTER — Ambulatory Visit: Payer: Managed Care, Other (non HMO) | Admitting: Psychology

## 2017-03-15 ENCOUNTER — Encounter: Payer: Self-pay | Admitting: Cardiovascular Disease

## 2017-03-15 NOTE — Progress Notes (Signed)
Patient ID: David Cunningham, male   DOB: 07/04/1956, 61 y.o.   MRN: [id number]   60 y.o. referred by Dr Raliegh Ip for bicuspid AV and abnormal echo. He has known about this for years but not been followed closely. He indicates his valve didn't close well but didn't understand issues with bicuspid valve. He travels a lot for work and had an episode of diaphoresis and lightheadedness. Lasted a few minutes. No chest pain. Reviewed echo with patient Mild LVH 12 mm and normal EF with Moderate AR mild AS bicuspid valve with well compensated LV. Poor visualization of ascending aortic root. No family history of congenital disease. No previous history of CAD. Lightheadness has not recurred and was not postural in nature.   Diet has excess salt, drinks scotch most nights and smoking Discussed relationship to high BP. Counseled for less than 10 minutes and has some motivation to quit Has script for Chantix by Dr Sherren Mocha   ETT 10/09/12 normal with HTN response   MRI/MRA:  07/2014  IMPRESSION: 1) Bicuspid Aortic Valve with severe appearing AR. Suggest echo correlation  2) Moderate aortic root enlargement 4.2 cm stable compared to study done 12/13  3) Mild LAE  4) EF 72% with mild LVE  5) No delayed enhancement in the LV myocardium  Echo 02/12/15 normal EF mild AS/AR Study Conclusions  - Left ventricle: The cavity size was mildly dilated. Wall thickness was normal. Systolic function was normal. The estimated ejection fraction was in the range of 60% to 65%. - Aortic valve: AV is thickened with veryl mildly restricted motion. Peak and mean gradients through the valve are 24 and 13 mm Hg respectively. There was mild regurgitation. - Mitral valve: There was mild regurgitation.  CT head HP 02/2015 no aneurysm normal carotid system  Doing allright No dyspnea chest pain or syncope Dentition in good shape  Youngest son graduated from [employer] has job in [place]. Step daughter in [place] and oldest son in  [place] Still working with commercial air conditioning   ROS: Denies fever, malais, weight loss, blurry vision, decreased visual acuity, cough, sputum, SOB, hemoptysis, pleuritic pain, palpitaitons, heartburn, abdominal pain, melena, lower extremity edema, claudication, or rash.  All other systems reviewed and negative  General: Affect appropriate Healthy:  appears stated age 14: normal Neck supple with no adenopathy JVP normal no bruits no thyromegaly Lungs clear with no wheezing and good diaphragmatic motion Heart:  S1/S2 SEM and  AR  murmur, no rub, gallop or click PMI normal Abdomen: benighn, BS positve, no tenderness, no AAA no bruit.  No HSM or HJR Distal pulses intact with no bruits No edema Neuro non-focal Skin warm and dry No muscular weakness    Current Outpatient Prescriptions  Medication Sig Dispense Refill  . cetirizine (ZYRTEC) 5 MG tablet Take 5 mg by mouth daily.      . fluticasone (FLONASE) 50 MCG/ACT nasal spray USE 2 SPRAYS IN EACH NOSTRIL DAILY AS NEEDED FOR ALLERGIES OR RHINITIS 48 g 1  . ibuprofen (ADVIL,MOTRIN) 200 MG tablet Take 200 mg by mouth every 6 (six) hours as needed.    Marland Kitchen losartan (COZAAR) 25 MG tablet Take 1 tablet (25 mg total) by mouth daily. *Patient is overdue for an appointment. Please call and schedule for further refills* 90 tablet 0  . montelukast (SINGULAIR) 10 MG tablet Take 10 mg by mouth at bedtime.    . naproxen sodium (ANAPROX) 220 MG tablet Take 220 mg by mouth as needed (for arthritis pain).  No current facility-administered medications for this visit.     Allergies  Patient has no known allergies.  Electrocardiogram:  SR rate 65 LVH  2014  06/25/14   SR rate 64 LVH no change  09/08/15  SB rate 56 normal   Assessment and Plan AR:  Mild by echo 2016 and mild AS bicuspid valve   EF normal continue losartan  Needs f/u echo  Allergies:  Continue zyrtec consider flonase  F/u primary  ETOH:  Encouraged more moderation in  intake labs/LFTls with primary Smoking:  Counseled on smoking cessation for less than 10 minutes Has had script for Chantix    [employer]

## 2017-03-19 ENCOUNTER — Encounter: Payer: Self-pay | Admitting: Cardiovascular Disease

## 2017-03-19 ENCOUNTER — Ambulatory Visit (INDEPENDENT_AMBULATORY_CARE_PROVIDER_SITE_OTHER): Payer: 59 | Admitting: Cardiovascular Disease

## 2017-03-19 VITALS — BP 130/66 | HR 73 | Ht 69.0 in | Wt 171.2 lb

## 2017-03-19 DIAGNOSIS — Q231 Congenital insufficiency of aortic valve: Secondary | ICD-10-CM

## 2017-03-19 DIAGNOSIS — I359 Nonrheumatic aortic valve disorder, unspecified: Secondary | ICD-10-CM

## 2017-03-19 MED ORDER — LOSARTAN POTASSIUM 25 MG PO TABS: 25.0000 mg | ORAL_TABLET | Freq: Every day | ORAL | 3 refills | Status: DC

## 2017-03-19 NOTE — Patient Instructions (Addendum)

## 2017-03-30 ENCOUNTER — Other Ambulatory Visit: Payer: Self-pay

## 2017-03-30 ENCOUNTER — Ambulatory Visit (HOSPITAL_COMMUNITY): Payer: 59 | Attending: Cardiology

## 2017-03-30 DIAGNOSIS — I359 Nonrheumatic aortic valve disorder, unspecified: Secondary | ICD-10-CM | POA: Diagnosis present

## 2017-03-30 DIAGNOSIS — Q231 Congenital insufficiency of aortic valve: Secondary | ICD-10-CM | POA: Insufficient documentation

## 2017-04-03 ENCOUNTER — Telehealth: Payer: Self-pay | Admitting: Cardiovascular Disease

## 2017-04-03 NOTE — Telephone Encounter (Signed)
Called patient about echo results.

## 2017-04-03 NOTE — Telephone Encounter (Signed)
New message     Pt is returning Pam call for results

## 2017-07-11 ENCOUNTER — Other Ambulatory Visit: Payer: Self-pay | Admitting: Physician Assistant

## 2017-07-11 DIAGNOSIS — J302 Other seasonal allergic rhinitis: Secondary | ICD-10-CM

## 2017-07-30 ENCOUNTER — Other Ambulatory Visit: Payer: Self-pay | Admitting: Physician Assistant

## 2017-10-01 ENCOUNTER — Other Ambulatory Visit: Payer: Self-pay | Admitting: Cardiovascular Disease

## 2017-10-01 ENCOUNTER — Encounter: Payer: Self-pay | Admitting: Cardiovascular Disease

## 2017-10-01 ENCOUNTER — Encounter: Payer: Self-pay | Admitting: Physician Assistant

## 2017-10-01 DIAGNOSIS — I359 Nonrheumatic aortic valve disorder, unspecified: Secondary | ICD-10-CM

## 2017-10-01 DIAGNOSIS — J302 Other seasonal allergic rhinitis: Secondary | ICD-10-CM

## 2017-10-01 MED ORDER — LOSARTAN POTASSIUM 25 MG PO TABS: 25.0000 mg | ORAL_TABLET | Freq: Every day | ORAL | 1 refills | Status: DC

## 2017-10-01 NOTE — Telephone Encounter (Signed)
Pt's medication was sent to pt's pharmacy as requested. Confirmation received.  °

## 2017-10-02 MED ORDER — MONTELUKAST SODIUM 10 MG PO TABS: 10.0000 mg | ORAL_TABLET | Freq: Every day | ORAL | 1 refills | Status: DC

## 2017-10-02 MED ORDER — FLUTICASONE PROPIONATE 50 MCG/ACT NA SUSP: NASAL | 1 refills | Status: DC

## 2017-10-11 ENCOUNTER — Ambulatory Visit: Payer: BLUE CROSS/BLUE SHIELD | Admitting: Medical

## 2017-10-11 ENCOUNTER — Encounter: Payer: Self-pay | Admitting: Medical

## 2017-10-11 VITALS — BP 131/54 | HR 64 | Temp 98.1°F | Resp 16 | Ht 69.0 in | Wt 170.4 lb

## 2017-10-11 DIAGNOSIS — R0981 Nasal congestion: Secondary | ICD-10-CM | POA: Diagnosis not present

## 2017-10-11 DIAGNOSIS — J0100 Acute maxillary sinusitis, unspecified: Secondary | ICD-10-CM | POA: Diagnosis not present

## 2017-10-11 MED ORDER — BENZONATATE 100 MG PO CAPS: ORAL_CAPSULE | ORAL | 0 refills | Status: DC

## 2017-10-11 MED ORDER — AZELASTINE HCL 0.1 % NA SOLN
2.0000 | Freq: Two times a day (BID) | NASAL | 11 refills | Status: DC
Start: 2017-10-11 — End: 2018-01-14

## 2017-10-11 MED ORDER — METHYLPREDNISOLONE ACETATE 40 MG/ML IJ SUSP
40.0000 mg | Freq: Once | INTRAMUSCULAR | Status: AC
  Administered 2017-10-11: 40 mg via INTRAMUSCULAR

## 2017-10-11 MED ORDER — DOXYCYCLINE HYCLATE 100 MG PO TABS: 100.0000 mg | ORAL_TABLET | Freq: Two times a day (BID) | ORAL | 0 refills | Status: DC

## 2017-10-11 NOTE — Patient Instructions (Signed)
Your appear to have a sinus infection with allergy history. I am prescribing  Doxycycline antibiotic for the infection. To help with the nasal congestion I prescribed astelin(continue current allergy meds). For your associated cough, I prescribed cough medicine benzonatate.  After discussion decided to give depomedrol 40 mg im  Rest, hydrate, tylenol for fever.  Follow up in 7 days or as needed.

## 2017-10-11 NOTE — Progress Notes (Signed)
Subjective:    Patient ID: David Cunningham, male    DOB: 01-29-1956, 61 y.o.   MRN: [id number]  HPI  Pt in with some history chronic nasal congestion and 3-4 times a year will get sinus infections. Pt takes flonase, montelukast and zyrtec.  Pt states a while(maybe a year) since he had last sinus infection.  Pt left maxillary sinus has been hurting today preceded by the above congestion. Mild teeth discomfort.  No fever, no chill or sweats.   Review of Systems  Constitutional: Negative for chills, fatigue and fever.  HENT: Positive for congestion, sinus pressure and sinus pain. Negative for facial swelling and sore throat.   Respiratory: Positive for cough. Negative for chest tightness, shortness of breath and wheezing.   Cardiovascular: Negative for chest pain and palpitations.  Gastrointestinal: Negative for abdominal pain, constipation, diarrhea, nausea and vomiting.  Musculoskeletal: Negative for back pain, gait problem, myalgias and neck stiffness.  Skin: Negative for pallor and rash.  Neurological: Negative for dizziness, seizures, speech difficulty, weakness, light-headedness and headaches.  Hematological: Negative for adenopathy. Does not bruise/bleed easily.  Psychiatric/Behavioral: Negative for behavioral problems and confusion. The patient is not nervous/anxious.      Past Medical History:  Diagnosis Date  . Allergy   . Arthritis    hand, neck  . Back pain 09/11/2016   patient denies back pain but has neck pain  . Bicuspid aortic valve   . DEGENERATIVE DISC DISEASE, CERVICAL SPINE   . Heart murmur    never has caused any problems  . History of chicken pox   . Hypertension   . Migraines    last one 2 wks ago- allergy related   . MITRAL VALVE PROLAPSE   . PSA, INCREASED   . TINNITUS, CHRONIC, BILATERAL      Social History   Socioeconomic History  . Marital status: Married    Spouse name: Not on file  . Number of children: Not on file  . Years of  education: Not on file  . Highest education level: Not on file  Social Needs  . Financial resource strain: Not on file  . Food insecurity - worry: Not on file  . Food insecurity - inability: Not on file  . Transportation needs - medical: Not on file  . Transportation needs - non-medical: Not on file  Occupational History  . Not on file  Tobacco Use  . Smoking status: Current Every Day Smoker    Packs/day: 0.50    Years: 40.00    Pack years: 20.00    Types: Cigarettes  . Smokeless tobacco: Never Used  Substance and Sexual Activity  . Alcohol use: Yes    Alcohol/week: 4.2 oz    Types: 7 Standard drinks or equivalent per week    Comment: 1 glass of scotch daily  . Drug use: No  . Sexual activity: Yes    Partners: Female    Comment: wife  Other Topics Concern  . Not on file  Social History Narrative  . Not on file    Past Surgical History:  Procedure Laterality Date  . COLONOSCOPY  2012   [place] hx polyps  . CYST REMOVAL NECK     And Face  . POLYPECTOMY     Colon  . Tonsillectome    . WISDOM TOOTH EXTRACTION      Family History  Problem Relation Age of Onset  . Colon cancer Mother 97       Deceased  .  Lung cancer Mother   . Liver cancer Mother   . Breast cancer Sister   . Lymphoma Father 55       Deceased  . Diabetes Father   . Cancer Other        Paternal Grandparents  . Cancer Other        Maternal Grandparents  . Emphysema Paternal Grandfather   . Emphysema Maternal Grandfather   . Cancer Paternal Aunt   . Cancer Maternal Aunt   . Esophageal cancer Neg Hx   . Stomach cancer Neg Hx     No Known Allergies  Current Outpatient Medications on File Prior to Visit  Medication Sig Dispense Refill  . cetirizine (ZYRTEC) 5 MG tablet Take 5 mg by mouth daily.      . fluticasone (FLONASE) 50 MCG/ACT nasal spray USE 2 SPRAYS IN EACH NOSTRIL DAILY AS NEEDED FOR ALLERGIES OR RHINITIS 48 g 1  . ibuprofen (ADVIL,MOTRIN) 200 MG tablet Take 200 mg by mouth every  6 (six) hours as needed.    Marland Kitchen losartan (COZAAR) 25 MG tablet Take 1 tablet (25 mg total) by mouth daily. 90 tablet 1  . montelukast (SINGULAIR) 10 MG tablet Take 1 tablet (10 mg total) by mouth at bedtime. 90 tablet 1  . naproxen sodium (ANAPROX) 220 MG tablet Take 220 mg by mouth as needed (for arthritis pain).     No current facility-administered medications on file prior to visit.     BP (!) 131/54   Pulse 64   Temp 98.1 F (36.7 C) (Oral)   Resp 16   Ht 5\' 9"  (1.753 m)   Wt 170 lb 6.4 oz (77.3 kg)   SpO2 100%   BMI 25.16 kg/m       Objective:   Physical Exam   General  Mental Status - Alert. General Appearance - Well groomed. Not in acute distress.  Skin Rashes- No Rashes.  HEENT Head- Normal. Ear Auditory Canal - Left- Normal. Right - Normal.Tympanic Membrane- Left- Normal. Right- Normal. Eye Sclera/Conjunctiva- Left- Normal. Right- Normal. Nose & Sinuses Nasal Mucosa- Left-  Boggy and Congested. Right-  Boggy and  Congested.Bilateral  maxillary but  frontal sinus pressure. Mouth & Throat Lips: Upper Lip- Normal: no dryness, cracking, pallor, cyanosis, or vesicular eruption. Lower Lip-Normal: no dryness, cracking, pallor, cyanosis or vesicular eruption. Buccal Mucosa- Bilateral- No Aphthous ulcers. Oropharynx- No Discharge or Erythema. Tonsils: Characteristics- Bilateral- No Erythema or Congestion. Size/Enlargement- Bilateral- No enlargement. Discharge- bilateral-None.  Neck Neck- Supple. No Masses.   Chest and Lung Exam Auscultation: Breath Sounds:-Clear even and unlabored.  Cardiovascular Auscultation:Rythm- Regular, rate and rhythm. Murmurs & Other Heart Sounds:Ausculatation of the heart reveal- No Murmurs.  Lymphatic Head & Neck General Head & Neck Lymphatics: Bilateral: Description- No Localized lymphadenopathy.      Assessment & Plan:  Your appear to have a sinus infection with allergy history. I am prescribing  Doxycycline antibiotic for the  infection. To help with the nasal congestion I prescribed astelin(continue current allergy meds). For your associated cough, I prescribed cough medicine benzonatate.  After discussion decided to give depomedrol 40 mg im  Rest, hydrate, tylenol for fever.  Follow up in 7 days or as needed.  Sander Speckman, Percell Miller, PA-C

## 2018-01-14 ENCOUNTER — Ambulatory Visit (INDEPENDENT_AMBULATORY_CARE_PROVIDER_SITE_OTHER): Payer: BLUE CROSS/BLUE SHIELD | Admitting: Physician Assistant

## 2018-01-14 ENCOUNTER — Other Ambulatory Visit: Payer: Self-pay

## 2018-01-14 ENCOUNTER — Encounter: Payer: Self-pay | Admitting: Physician Assistant

## 2018-01-14 VITALS — BP 126/60 | HR 64 | Temp 97.7°F | Resp 16 | Ht 69.0 in | Wt 165.0 lb

## 2018-01-14 DIAGNOSIS — Z0000 Encounter for general adult medical examination without abnormal findings: Secondary | ICD-10-CM

## 2018-01-14 DIAGNOSIS — Z122 Encounter for screening for malignant neoplasm of respiratory organs: Secondary | ICD-10-CM | POA: Diagnosis not present

## 2018-01-14 DIAGNOSIS — R972 Elevated prostate specific antigen [PSA]: Secondary | ICD-10-CM | POA: Diagnosis not present

## 2018-01-14 DIAGNOSIS — I10 Essential (primary) hypertension: Secondary | ICD-10-CM | POA: Diagnosis not present

## 2018-01-14 LAB — COMPREHENSIVE METABOLIC PANEL
ALT: 19 U/L (ref 0–53)
AST: 22 U/L (ref 0–37)
Albumin: 4.4 g/dL (ref 3.5–5.2)
Alkaline Phosphatase: 50 U/L (ref 39–117)
BUN: 19 mg/dL (ref 6–23)
CHLORIDE: 103 meq/L (ref 96–112)
CO2: 30 mEq/L (ref 19–32)
CREATININE: 0.89 mg/dL (ref 0.40–1.50)
Calcium: 9.3 mg/dL (ref 8.4–10.5)
GFR: 92.76 mL/min (ref >60.00)
Glucose, Bld: 101 mg/dL — ABNORMAL HIGH (ref 70–99)
POTASSIUM: 5.2 meq/L — AB (ref 3.5–5.1)
SODIUM: 138 meq/L (ref 135–145)
TOTAL PROTEIN: 6.8 g/dL (ref 6.0–8.3)
Total Bilirubin: 0.5 mg/dL (ref 0.2–1.2)

## 2018-01-14 LAB — CBC WITH DIFFERENTIAL/PLATELET
Basophils Absolute: 0.1 10*3/uL (ref 0.0–0.1)
Basophils Relative: 1.2 % (ref 0.0–3.0)
EOS ABS: 0.1 10*3/uL (ref 0.0–0.7)
EOS PCT: 1.3 % (ref 0.0–5.0)
HCT: 48.1 % (ref 39.0–52.0)
HEMOGLOBIN: 16.2 g/dL (ref 13.0–17.0)
LYMPHS ABS: 2.2 10*3/uL (ref 0.7–4.0)
Lymphocytes Relative: 28.9 % (ref 12.0–46.0)
MCHC: 33.8 g/dL (ref 30.0–36.0)
MCV: 98.3 fl (ref 78.0–100.0)
Monocytes Absolute: 0.7 10*3/uL (ref 0.1–1.0)
Monocytes Relative: 8.6 % (ref 3.0–12.0)
NEUTROS PCT: 60 % (ref 43.0–77.0)
Neutro Abs: 4.6 10*3/uL (ref 1.4–7.7)
Platelets: 221 10*3/uL (ref 150.0–400.0)
RBC: 4.89 Mil/uL (ref 4.22–5.81)
RDW: 14.2 % (ref 11.5–15.5)
WBC: 7.7 10*3/uL (ref 4.0–10.5)

## 2018-01-14 LAB — LIPID PANEL
Cholesterol: 147 mg/dL (ref 0–200)
HDL: 80.8 mg/dL (ref >39.00)
LDL Cholesterol: 58 mg/dL (ref 0–99)
NONHDL: 65.82
Total CHOL/HDL Ratio: 2
Triglycerides: 39 mg/dL (ref 0.0–149.0)
VLDL: 7.8 mg/dL (ref 0.0–40.0)

## 2018-01-14 LAB — HEMOGLOBIN A1C: Hgb A1c MFr Bld: 5.4 % (ref 4.6–6.5)

## 2018-01-14 NOTE — Assessment & Plan Note (Signed)
Cessation reviewed. He wants to work on this on his own. Order for low-dose CT placed for screening.

## 2018-01-14 NOTE — Assessment & Plan Note (Signed)
Followed by Urology. Has follow-up next month. Is requesting PSA level today to take result to appointment. Labs ordered.

## 2018-01-14 NOTE — Assessment & Plan Note (Signed)
BP stable. Continue current regimen. Labs today.

## 2018-01-14 NOTE — Assessment & Plan Note (Signed)
Depression screen negative. Health Maintenance reviewed -- Declines Hep C screen.. Preventive schedule discussed and handout given in AVS. Will obtain fasting labs today.

## 2018-01-14 NOTE — Progress Notes (Signed)
Patient presents to clinic today for annual exam.  Patient is fasting for labs.  Acute Concerns: Denies acute concerns today.  Chronic Issues: Hypertension -- Currently on losartan 25 mg daily.  Is taking as directed. Patient denies chest pain, palpitations, lightheadedness, dizziness, vision changes or frequent headaches.  Tobacco Abuse -- Still smoking < 1ppd. Is > 30 pack-year smoker. Would like to discuss lung cancer screen. Patient and wife have been talking about quitting together. They are going to try OTC measures first.   Health Maintenance: Immunizations -- up-to-date Colonoscopy -- up-to-date  Past Medical History:  Diagnosis Date  . Allergy   . Arthritis    hand, neck  . Back pain 09/11/2016   patient denies back pain but has neck pain  . Bicuspid aortic valve   . DEGENERATIVE DISC DISEASE, CERVICAL SPINE   . Heart murmur    never has caused any problems  . History of chicken pox   . Hypertension   . Migraines    last one 2 wks ago- allergy related   . MITRAL VALVE PROLAPSE   . PSA, INCREASED   . TINNITUS, CHRONIC, BILATERAL     Past Surgical History:  Procedure Laterality Date  . COLONOSCOPY  2012   [place] hx polyps  . CYST REMOVAL NECK     And Face  . POLYPECTOMY     Colon  . Tonsillectome    . WISDOM TOOTH EXTRACTION      Current Outpatient Medications on File Prior to Visit  Medication Sig Dispense Refill  . cetirizine (ZYRTEC) 5 MG tablet Take 5 mg by mouth daily.      . fluticasone (FLONASE) 50 MCG/ACT nasal spray USE 2 SPRAYS IN EACH NOSTRIL DAILY AS NEEDED FOR ALLERGIES OR RHINITIS 48 g 1  . losartan (COZAAR) 25 MG tablet Take 1 tablet (25 mg total) by mouth daily. 90 tablet 1  . naproxen sodium (ANAPROX) 220 MG tablet Take 220 mg by mouth as needed (for arthritis pain).     No current facility-administered medications on file prior to visit.     No Known Allergies  Family History  Problem Relation Age of Onset  . Colon cancer  Mother 12       Deceased  . Lung cancer Mother   . Liver cancer Mother   . Breast cancer Sister   . Lymphoma Father 24       Deceased  . Diabetes Father   . Cancer Other        Paternal Grandparents  . Cancer Other        Maternal Grandparents  . Emphysema Paternal Grandfather   . Emphysema Maternal Grandfather   . Cancer Paternal Aunt   . Cancer Maternal Aunt   . Esophageal cancer Neg Hx   . Stomach cancer Neg Hx     Social History   Socioeconomic History  . Marital status: Married    Spouse name: Not on file  . Number of children: Not on file  . Years of education: Not on file  . Highest education level: Not on file  Social Needs  . Financial resource strain: Not on file  . Food insecurity - worry: Not on file  . Food insecurity - inability: Not on file  . Transportation needs - medical: Not on file  . Transportation needs - non-medical: Not on file  Occupational History  . Occupation: Retired  Tobacco Use  . Smoking status: Current Every Day Smoker  Packs/day: 0.50    Years: 40.00    Pack years: 20.00    Types: Cigarettes  . Smokeless tobacco: Never Used  Substance and Sexual Activity  . Alcohol use: Yes    Alcohol/week: 4.2 oz    Types: 7 Standard drinks or equivalent per week    Comment: 1 glass of scotch daily  . Drug use: No  . Sexual activity: Yes    Partners: Female    Comment: wife  Other Topics Concern  . Not on file  Social History Narrative  . Not on file   Review of Systems  Constitutional: Negative for fever and weight loss.  HENT: Negative for ear discharge, ear pain, hearing loss and tinnitus.   Eyes: Negative for blurred vision, double vision, photophobia and pain.  Respiratory: Negative for cough and shortness of breath.   Cardiovascular: Negative for chest pain and palpitations.  Gastrointestinal: Negative for abdominal pain, blood in stool, constipation, diarrhea, heartburn, melena, nausea and vomiting.  Genitourinary: Negative  for dysuria, flank pain, frequency, hematuria and urgency.  Musculoskeletal: Negative for falls.  Neurological: Negative for dizziness, loss of consciousness and headaches.  Endo/Heme/Allergies: Negative for environmental allergies.  Psychiatric/Behavioral: Negative for depression, hallucinations, substance abuse and suicidal ideas. The patient is nervous/anxious (situational -- sees therapist with great improvement). The patient does not have insomnia.    BP 126/60   Pulse 64   Temp 97.7 F (36.5 C) (Oral)   Resp 16   Ht 5\' 9"  (1.753 m)   Wt 165 lb (74.8 kg)   SpO2 99%   BMI 24.37 kg/m   Physical Exam  Constitutional: He is oriented to person, place, and time and well-developed, well-nourished, and in no distress.  HENT:  Head: Normocephalic and atraumatic.  Right Ear: External ear normal.  Left Ear: External ear normal.  Nose: Nose normal.  Mouth/Throat: Oropharynx is clear and moist. No oropharyngeal exudate.  Eyes: Conjunctivae and EOM are normal. Pupils are equal, round, and reactive to light.  Neck: Neck supple. No thyromegaly present.  Cardiovascular: Normal rate, regular rhythm, normal heart sounds and intact distal pulses.  Pulmonary/Chest: Effort normal and breath sounds normal. No respiratory distress. He has no wheezes. He has no rales. He exhibits no tenderness.  Abdominal: Soft. Bowel sounds are normal. He exhibits no distension and no mass. There is no tenderness. There is no rebound and no guarding.  Genitourinary: Testes/scrotum normal and penis normal. No discharge found.  Lymphadenopathy:    He has no cervical adenopathy.  Neurological: He is alert and oriented to person, place, and time.  Skin: Skin is warm and dry. No rash noted.  Psychiatric: Affect normal.  Vitals reviewed.  Assessment/Plan: Hypertension BP stable. Continue current regimen. Labs today.  Visit for preventive health examination Depression screen negative. Health Maintenance reviewed --  Declines Hep C screen.. Preventive schedule discussed and handout given in AVS. Will obtain fasting labs today.   PSA, INCREASED Followed by Urology. Has follow-up next month. Is requesting PSA level today to take result to appointment. Labs ordered.  Encounter for screening for lung cancer Cessation reviewed. He wants to work on this on his own. Order for low-dose CT placed for screening.     [name], PA-C

## 2018-01-14 NOTE — Patient Instructions (Signed)
Please go to the lab for blood work.   Our office will call you with your results unless you have chosen to receive results via MyChart.  If your blood work is normal we will follow-up each year for physicals and as scheduled for chronic medical problems.  If anything is abnormal we will treat accordingly and get you in for a follow-up.  You will be contacted for lung cancer screening CT.    Preventive Care 40-64 Years, Male Preventive care refers to lifestyle choices and visits with your health care provider that can promote health and wellness. What does preventive care include?  A yearly physical exam. This is also called an annual well check.  Dental exams once or twice a year.  Routine eye exams. Ask your health care provider how often you should have your eyes checked.  Personal lifestyle choices, including: ? Daily care of your teeth and gums. ? Regular physical activity. ? Eating a healthy diet. ? Avoiding tobacco and drug use. ? Limiting alcohol use. ? Practicing safe sex. ? Taking low-dose aspirin every day starting at age 82. What happens during an annual well check? The services and screenings done by your health care provider during your annual well check will depend on your age, overall health, lifestyle risk factors, and family history of disease. Counseling Your health care provider may ask you questions about your:  Alcohol use.  Tobacco use.  Drug use.  Emotional well-being.  Home and relationship well-being.  Sexual activity.  Eating habits.  Work and work Statistician.  Screening You may have the following tests or measurements:  Height, weight, and BMI.  Blood pressure.  Lipid and cholesterol levels. These may be checked every 5 years, or more frequently if you are over 17 years old.  Skin check.  Lung cancer screening. You may have this screening every year starting at age 74 if you have a 30-pack-year history of smoking and currently  smoke or have quit within the past 15 years.  Fecal occult blood test (FOBT) of the stool. You may have this test every year starting at age 81.  Flexible sigmoidoscopy or colonoscopy. You may have a sigmoidoscopy every 5 years or a colonoscopy every 10 years starting at age 69.  Prostate cancer screening. Recommendations will vary depending on your family history and other risks.  Hepatitis C blood test.  Hepatitis B blood test.  Sexually transmitted disease (STD) testing.  Diabetes screening. This is done by checking your blood sugar (glucose) after you have not eaten for a while (fasting). You may have this done every 1-3 years.  Discuss your test results, treatment options, and if necessary, the need for more tests with your health care provider. Vaccines Your health care provider may recommend certain vaccines, such as:  Influenza vaccine. This is recommended every year.  Tetanus, diphtheria, and acellular pertussis (Tdap, Td) vaccine. You may need a Td booster every 10 years.  Varicella vaccine. You may need this if you have not been vaccinated.  Zoster vaccine. You may need this after age 61.  Measles, mumps, and rubella (MMR) vaccine. You may need at least one dose of MMR if you were born in 1957 or later. You may also need a second dose.  Pneumococcal 13-valent conjugate (PCV13) vaccine. You may need this if you have certain conditions and have not been vaccinated.  Pneumococcal polysaccharide (PPSV23) vaccine. You may need one or two doses if you smoke cigarettes or if you have certain conditions.  Meningococcal vaccine. You may need this if you have certain conditions.  Hepatitis A vaccine. You may need this if you have certain conditions or if you travel or work in places where you may be exposed to hepatitis A.  Hepatitis B vaccine. You may need this if you have certain conditions or if you travel or work in places where you may be exposed to hepatitis  B.  Haemophilus influenzae type b (Hib) vaccine. You may need this if you have certain risk factors.  Talk to your health care provider about which screenings and vaccines you need and how often you need them. This information is not intended to replace advice given to you by your health care provider. Make sure you discuss any questions you have with your health care provider. Document Released: 11/19/2015 Document Revised: 07/12/2016 Document Reviewed: 08/24/2015 Elsevier Interactive Patient Education  [employer].

## 2018-01-15 LAB — PSA: PSA: 3.74 ng/mL (ref 0.10–4.00)

## 2018-03-11 ENCOUNTER — Telehealth (HOSPITAL_COMMUNITY): Payer: Self-pay | Admitting: Cardiovascular Disease

## 2018-03-11 ENCOUNTER — Telehealth: Payer: Self-pay

## 2018-03-11 DIAGNOSIS — I350 Nonrheumatic aortic (valve) stenosis: Secondary | ICD-10-CM

## 2018-03-11 NOTE — Progress Notes (Signed)
Patient ID: David Cunningham, male   DOB: [birth year], 62 y.o.   MRN: [id number]   61 y.o. f/u for bicuspid AV disease. No family history of cardiac disease. No history of CAD or chest pain. Travels for work a lot. Poor diet with excess salt, smokes and has scotch most nights. Normal ETT 10/09/12 with HTN response. Echo 03/30/17 EF 60-65% bicuspid AV with  Mild AS mean gradient 13 mmHg peak 24 mmHg mild AR and mild MR.  MRI/MRA 2015 with moderate aortic root enlargement 4.2 cm EF 72% no gadolinium uptake.  CT head HP 02/2015 no aneurysm normal carotid system  Dentition in good shape  Youngest son graduated from [employer] has job in [place]. Step daughter in [place] and oldest son in [place] working with commercial air conditioning   Reviewed echo from 03/13/18 EF 55-60% mild LVH normal size  Moderate AS mean gradient 19 mmHg peak 42 mmHg Moderate to Severe AR   He feels great retired this year from [employer]: Denies fever, malais, weight loss, blurry vision, decreased visual acuity, cough, sputum, SOB, hemoptysis, pleuritic pain, palpitaitons, heartburn, abdominal pain, melena, lower extremity edema, claudication, or rash.  All other systems reviewed and negative  General: BP (!) 144/68   Pulse 64   Ht 5\' 9"  (1.753 m)   Wt 164 lb 8 oz (74.6 kg)   SpO2 98%   BMI 24.29 kg/m  Affect appropriate Healthy:  appears stated age HEENT: normal Neck supple with no adenopathy JVP normal no bruits no thyromegaly Lungs clear with no wheezing and good diaphragmatic motion Heart:  S1/S2 AS/AR  murmur, no rub, gallop or click PMI normal Abdomen: benighn, BS positve, no tenderness, no AAA no bruit.  No HSM or HJR Distal pulses intact with no bruits No edema Neuro non-focal Skin warm and dry No muscular weakness    Current Outpatient Medications  Medication Sig Dispense Refill  . cetirizine (ZYRTEC) 5 MG tablet Take 5 mg by mouth daily.      . fluticasone (FLONASE) 50 MCG/ACT  nasal spray USE 2 SPRAYS IN EACH NOSTRIL DAILY AS NEEDED FOR ALLERGIES OR RHINITIS 48 g 1  . losartan (COZAAR) 25 MG tablet Take 1 tablet (25 mg total) by mouth daily. 90 tablet 1  . naproxen sodium (ANAPROX) 220 MG tablet Take 220 mg by mouth as needed (for arthritis pain).     No current facility-administered medications for this visit.     Allergies  Patient has no known allergies.  Electrocardiogram:  03/14/18 SR rate 57 LVH   Assessment and Plan Bicuspid AV:  With moderate AS and moderate to severe AR Compensated LV and no symptoms F/u echo in 6 months  Allergies:  Continue zyrtec consider flonase  F/u primary  ETOH:  Encouraged more moderation in intake labs/LFTls with primary Smoking:  Counseled on smoking cessation for less than 10 minutes Has had script for Chantix    [employer]

## 2018-03-11 NOTE — Telephone Encounter (Signed)
Patient is due for echo per Dr. Kyla Balzarine result note. Will place order and send message to scheduling.    Notes recorded by Josue Hector, MD on 04/03/2017 at 7:53 AM EDT Mild AS moderate AR stable EF ok f/u echo in a year

## 2018-03-11 NOTE — Telephone Encounter (Signed)
-----   Message from Michaelyn Barter, RN sent at 04/10/2017  2:02 PM EDT ----- Echo in May 2019

## 2018-03-12 NOTE — Telephone Encounter (Signed)
User: Cherie Dark A Date/time: 03/11/18 2:35 PM  Comment: Called pt and lmsg for him to CB to sch echo.Vassie Moment  Context:  Outcome: Left Message  Phone number: [phone] Phone Type: Mobile  Comm. type: Telephone Call type: Outgoing  Contact: Bonelli, Keifer Relation to patient: Self

## 2018-03-13 ENCOUNTER — Encounter: Payer: Self-pay | Admitting: *Deleted

## 2018-03-13 ENCOUNTER — Other Ambulatory Visit: Payer: Self-pay

## 2018-03-13 ENCOUNTER — Ambulatory Visit (HOSPITAL_COMMUNITY): Payer: BLUE CROSS/BLUE SHIELD | Attending: Cardiovascular Disease

## 2018-03-13 DIAGNOSIS — Q231 Congenital insufficiency of aortic valve: Secondary | ICD-10-CM | POA: Insufficient documentation

## 2018-03-13 DIAGNOSIS — I350 Nonrheumatic aortic (valve) stenosis: Secondary | ICD-10-CM

## 2018-03-13 DIAGNOSIS — F17200 Nicotine dependence, unspecified, uncomplicated: Secondary | ICD-10-CM | POA: Diagnosis not present

## 2018-03-13 DIAGNOSIS — I517 Cardiomegaly: Secondary | ICD-10-CM | POA: Insufficient documentation

## 2018-03-13 DIAGNOSIS — I77810 Thoracic aortic ectasia: Secondary | ICD-10-CM | POA: Insufficient documentation

## 2018-03-14 ENCOUNTER — Ambulatory Visit: Payer: BLUE CROSS/BLUE SHIELD | Admitting: Cardiovascular Disease

## 2018-03-14 ENCOUNTER — Encounter: Payer: Self-pay | Admitting: Cardiovascular Disease

## 2018-03-14 VITALS — BP 144/68 | HR 64 | Ht 69.0 in | Wt 164.5 lb

## 2018-03-14 DIAGNOSIS — I350 Nonrheumatic aortic (valve) stenosis: Secondary | ICD-10-CM | POA: Diagnosis not present

## 2018-03-14 DIAGNOSIS — Q231 Congenital insufficiency of aortic valve: Secondary | ICD-10-CM

## 2018-03-14 NOTE — Patient Instructions (Addendum)

## 2018-04-11 ENCOUNTER — Ambulatory Visit: Payer: BLUE CROSS/BLUE SHIELD | Admitting: Medical

## 2018-04-11 ENCOUNTER — Encounter: Payer: Self-pay | Admitting: Medical

## 2018-04-11 VITALS — BP 128/80 | HR 69 | Temp 98.4°F | Ht 69.0 in | Wt 162.5 lb

## 2018-04-11 DIAGNOSIS — J0100 Acute maxillary sinusitis, unspecified: Secondary | ICD-10-CM

## 2018-04-11 DIAGNOSIS — J301 Allergic rhinitis due to pollen: Secondary | ICD-10-CM

## 2018-04-11 DIAGNOSIS — R0981 Nasal congestion: Secondary | ICD-10-CM

## 2018-04-11 MED ORDER — METHYLPREDNISOLONE ACETATE 40 MG/ML IJ SUSP
40.0000 mg | Freq: Once | INTRAMUSCULAR | Status: AC
  Administered 2018-04-11: 40 mg via INTRAMUSCULAR

## 2018-04-11 MED ORDER — AMOXICILLIN-POT CLAVULANATE 875-125 MG PO TABS: 1.0000 | ORAL_TABLET | Freq: Two times a day (BID) | ORAL | 0 refills | Status: DC

## 2018-04-11 NOTE — Patient Instructions (Addendum)
You appear to have nasal congestion secondary to working outside as well as dust from recent projects around the house.  Possible allergic rhinitis  As some concern that you have developed early sinus infection in the left maxillary region.  You are already on Flonase and did not tolerate Astelin nasal spray well.  So I do think he will benefit from Depo-Medrol 40 mg IM.    If by tomorrow your symptoms are persisting despite the injection and Flonase then advise starting Augmentin antibiotic.  Follow-up in 7 to 10 days or as needed.

## 2018-04-11 NOTE — Progress Notes (Signed)
Subjective:    Patient ID: David Cunningham, male    DOB: 16-Dec-1955, 62 y.o.   MRN: [id number]  HPI  Pt in with acute onset of nasal congestion yesterday and then this morning left side maxillary sinus pain. Left side cheek feels puffy.  No sneezing and no itching eyes. No fever, no chills or sweats.  Pt has been doing some yard work. Grinding concrete and cutting tile before symptoms started.  Working in [employer], educational yesterday around house. Pt describes this as precipitating factors.   Review of Systems  Constitutional: Negative for chills, fatigue and fever.  HENT: Positive for congestion, facial swelling, postnasal drip, sinus pressure and sinus pain. Negative for sneezing, sore throat and trouble swallowing.   Respiratory: Negative for cough, chest tightness, shortness of breath and wheezing.   Cardiovascular: Negative for chest pain and palpitations.  Gastrointestinal: Negative for abdominal pain.  Musculoskeletal: Negative for back pain.  Skin: Negative for rash.  Neurological: Negative for dizziness and headaches.  Hematological: Negative for adenopathy. Does not bruise/bleed easily.  Psychiatric/Behavioral: Negative for behavioral problems and confusion.    Past Medical History:  Diagnosis Date  . Allergy   . Arthritis    hand, neck  . Back pain 09/11/2016   patient denies back pain but has neck pain  . Bicuspid aortic valve   . DEGENERATIVE DISC DISEASE, CERVICAL SPINE   . Heart murmur    never has caused any problems  . History of chicken pox   . Hypertension   . Migraines    last one 2 wks ago- allergy related   . MITRAL VALVE PROLAPSE   . PSA, INCREASED   . TINNITUS, CHRONIC, BILATERAL      Social History   Socioeconomic History  . Marital status: Married    Spouse name: Not on file  . Number of children: Not on file  . Years of education: Not on file  . Highest education level: Not on file  Occupational History  . Occupation: Retired  Scientific laboratory technician  .  Financial resource strain: Not on file  . Food insecurity:    Worry: Not on file    Inability: Not on file  . Transportation needs:    Medical: Not on file    Non-medical: Not on file  Tobacco Use  . Smoking status: Current Every Day Smoker    Packs/day: 0.50    Years: 40.00    Pack years: 20.00    Types: Cigarettes  . Smokeless tobacco: Never Used  Substance and Sexual Activity  . Alcohol use: Yes    Alcohol/week: 4.2 oz    Types: 7 Standard drinks or equivalent per week    Comment: 1 glass of scotch daily  . Drug use: No  . Sexual activity: Yes    Partners: Female    Comment: wife  Lifestyle  . Physical activity:    Days per week: Not on file    Minutes per session: Not on file  . Stress: Not on file  Relationships  . Social connections:    Talks on phone: Not on file    Gets together: Not on file    Attends religious service: Not on file    Active member of club or organization: Not on file    Attends meetings of clubs or organizations: Not on file    Relationship status: Not on file  . Intimate partner violence:    Fear of current or ex partner: Not on file  Emotionally abused: Not on file    Physically abused: Not on file    Forced sexual activity: Not on file  Other Topics Concern  . Not on file  Social History Narrative  . Not on file    Past Surgical History:  Procedure Laterality Date  . COLONOSCOPY  2012   [facility] hx polyps  . CYST REMOVAL NECK     And Face  . POLYPECTOMY     Colon  . Tonsillectome    . WISDOM TOOTH EXTRACTION      Family History  Problem Relation Age of Onset  . Colon cancer Mother 32       Deceased  . Lung cancer Mother   . Liver cancer Mother   . Breast cancer Sister   . Lymphoma Father 10       Deceased  . Diabetes Father   . Cancer Other        Paternal Grandparents  . Cancer Other        Maternal Grandparents  . Emphysema Paternal Grandfather   . Emphysema Maternal Grandfather   . Cancer Paternal Aunt   .  Cancer Maternal Aunt   . Esophageal cancer Neg Hx   . Stomach cancer Neg Hx     No Known Allergies  Current Outpatient Medications on File Prior to Visit  Medication Sig Dispense Refill  . cetirizine (ZYRTEC) 5 MG tablet Take 5 mg by mouth daily.      . fluticasone (FLONASE) 50 MCG/ACT nasal spray USE 2 SPRAYS IN EACH NOSTRIL DAILY AS NEEDED FOR ALLERGIES OR RHINITIS 48 g 1  . losartan (COZAAR) 25 MG tablet Take 1 tablet (25 mg total) by mouth daily. 90 tablet 1  . naproxen sodium (ANAPROX) 220 MG tablet Take 220 mg by mouth as needed (for arthritis pain).     No current facility-administered medications on file prior to visit.     BP 128/80 (BP Location: Left Arm, Patient Position: Sitting, Cuff Size: Normal)   Pulse 69   Temp 98.4 F (36.9 C) (Oral)   Ht 5\' 9"  (1.753 m)   Wt 162 lb 8 oz (73.7 kg)   SpO2 96%   BMI 24.00 kg/m       Objective:   Physical Exam   General  Mental Status - Alert. General Appearance - Well groomed. Not in acute distress.  Skin Rashes- No Rashes.  HEENT Head- Normal. Ear Auditory Canal - Left- Normal. Right - Normal.Tympanic Membrane- Left- Normal. Right- Normal. Eye Sclera/Conjunctiva- Left- Normal. Right- Normal. Nose & Sinuses Nasal Mucosa- Left-  Boggy and Congested. Right-  Boggy and  Congested Lt side maxillary and frontal sinus pressure. Mouth & Throat Lips: Upper Lip- Normal: no dryness, cracking, pallor, cyanosis, or vesicular eruption. Lower Lip-Normal: no dryness, cracking, pallor, cyanosis or vesicular eruption. Buccal Mucosa- Bilateral- No Aphthous ulcers. Oropharynx- No Discharge or Erythema. +pnd Tonsils: Characteristics- Bilateral- No Erythema or Congestion. Size/Enlargement- Bilateral- No enlargement. Discharge- bilateral-None.  Neck Neck- Supple. No Masses.   Chest and Lung Exam Auscultation: Breath Sounds:-Clear even and unlabored.  Cardiovascular Auscultation:Rythm- Regular, rate and rhythm. Murmurs & Other  Heart Sounds:Ausculatation of the heart reveal- No Murmurs.  Lymphatic Head & Neck General Head & Neck Lymphatics: Bilateral: Description- No Localized lymphadenopathy.     Assessment & Plan:  You appear to have nasal congestion secondary to working outside as well as dust from recent projects around the house.  Possible allergic rhinitis.  As some concern that you have  developed early sinus infection in the left maxillary region.  You are already on Flonase and did not tolerate Astelin nasal spray well.  So I do think he will benefit from Depo-Medrol 40 mg IM.    If by tomorrow your symptoms are persisting despite the injection and Flonase then advise starting Augmentin antibiotic.  Follow-up in 7 to 10 days or as needed.

## 2018-04-29 DIAGNOSIS — R972 Elevated prostate specific antigen [PSA]: Secondary | ICD-10-CM | POA: Diagnosis not present

## 2018-04-29 DIAGNOSIS — N400 Enlarged prostate without lower urinary tract symptoms: Secondary | ICD-10-CM | POA: Diagnosis not present

## 2018-05-31 ENCOUNTER — Encounter: Payer: Self-pay | Admitting: Cardiovascular Disease

## 2018-05-31 ENCOUNTER — Other Ambulatory Visit: Payer: Self-pay | Admitting: Physician Assistant

## 2018-05-31 ENCOUNTER — Encounter: Payer: Self-pay | Admitting: Physician Assistant

## 2018-05-31 DIAGNOSIS — I359 Nonrheumatic aortic valve disorder, unspecified: Secondary | ICD-10-CM

## 2018-05-31 DIAGNOSIS — J302 Other seasonal allergic rhinitis: Secondary | ICD-10-CM

## 2018-05-31 MED ORDER — MONTELUKAST SODIUM 10 MG PO TABS: 10.0000 mg | ORAL_TABLET | Freq: Every day | ORAL | 1 refills | Status: DC

## 2018-05-31 MED ORDER — LOSARTAN POTASSIUM 25 MG PO TABS: 25.0000 mg | ORAL_TABLET | Freq: Every day | ORAL | 3 refills | Status: DC

## 2018-05-31 MED ORDER — FLUTICASONE PROPIONATE 50 MCG/ACT NA SUSP: NASAL | 1 refills | Status: DC

## 2018-05-31 NOTE — Telephone Encounter (Signed)
Pt's medication was sent to pt's pharmacy as requested. Confirmation received.  °

## 2018-09-23 ENCOUNTER — Other Ambulatory Visit: Payer: Self-pay | Admitting: Physician Assistant

## 2018-09-23 ENCOUNTER — Encounter: Payer: Self-pay | Admitting: Physician Assistant

## 2018-09-23 DIAGNOSIS — J302 Other seasonal allergic rhinitis: Secondary | ICD-10-CM

## 2018-09-23 MED ORDER — FLUTICASONE PROPIONATE 50 MCG/ACT NA SUSP: NASAL | 1 refills | Status: DC

## 2018-09-23 MED ORDER — MONTELUKAST SODIUM 10 MG PO TABS: 10.0000 mg | ORAL_TABLET | Freq: Every day | ORAL | 1 refills | Status: DC

## 2019-01-07 ENCOUNTER — Ambulatory Visit (HOSPITAL_BASED_OUTPATIENT_CLINIC_OR_DEPARTMENT_OTHER)
Admission: RE | Admit: 2019-01-07 | Discharge: 2019-01-07 | Disposition: A | Discharge status: Home / Self Care | Payer: BLUE CROSS/BLUE SHIELD | Source: Ambulatory Visit | Attending: Medical | Admitting: Medical

## 2019-01-07 ENCOUNTER — Encounter: Payer: Self-pay | Admitting: Medical

## 2019-01-07 ENCOUNTER — Ambulatory Visit: Payer: BLUE CROSS/BLUE SHIELD | Admitting: Medical

## 2019-01-07 VITALS — BP 142/58 | HR 62 | Temp 99.1°F | Resp 16 | Ht 69.0 in | Wt 162.8 lb

## 2019-01-07 DIAGNOSIS — R05 Cough: Secondary | ICD-10-CM | POA: Diagnosis not present

## 2019-01-07 DIAGNOSIS — R059 Cough, unspecified: Secondary | ICD-10-CM

## 2019-01-07 DIAGNOSIS — J40 Bronchitis, not specified as acute or chronic: Secondary | ICD-10-CM | POA: Diagnosis not present

## 2019-01-07 DIAGNOSIS — J0100 Acute maxillary sinusitis, unspecified: Secondary | ICD-10-CM

## 2019-01-07 MED ORDER — BENZONATATE 100 MG PO CAPS: 100.0000 mg | ORAL_CAPSULE | Freq: Three times a day (TID) | ORAL | 0 refills | Status: DC | PRN

## 2019-01-07 MED ORDER — DOXYCYCLINE HYCLATE 100 MG PO TABS: 100.0000 mg | ORAL_TABLET | Freq: Two times a day (BID) | ORAL | 0 refills | Status: DC

## 2019-01-07 MED ORDER — ALBUTEROL SULFATE HFA 108 (90 BASE) MCG/ACT IN AERS: 2.0000 | INHALATION_SPRAY | Freq: Four times a day (QID) | RESPIRATORY_TRACT | 2 refills | Status: DC | PRN

## 2019-01-07 NOTE — Progress Notes (Signed)
Subjective:    Patient ID: David Cunningham, male    DOB: [birth year], 64 y.o.   MRN: [id number]  HPI Pt in states about 9 days ago he got nasal congestion, chest congestion, low grade fever, loose stools and faint achiness to body. At beginning he had clear dc but over past week he developed left sinus pressure, persisting productive cough and and some chest congestion. Intermittent mild shortness of breath after coughing episodes.  Pt did not get flu vaccine this year.  Pt wife got sick before him.  Pt has history of recurrent sinus infections.     Review of Systems  Constitutional: Positive for fatigue. Negative for chills and fever.  HENT: Positive for congestion, sinus pressure and sinus pain. Negative for ear pain, nosebleeds, sore throat and tinnitus.   Respiratory: Positive for cough. Negative for wheezing.        Mild shortness of breath intermittently. He does smoke  Gastrointestinal: Negative for abdominal pain.  Musculoskeletal: Positive for myalgias. Negative for back pain.       Achiness early on.  Skin: Negative for rash.  Neurological: Negative for dizziness, weakness and headaches.  Hematological: Negative for adenopathy. Does not bruise/bleed easily.  Psychiatric/Behavioral: Negative for behavioral problems. The patient is not nervous/anxious.     Past Medical History:  Diagnosis Date  . Allergy   . Arthritis    hand, neck  . Back pain 09/11/2016   patient denies back pain but has neck pain  . Bicuspid aortic valve   . DEGENERATIVE DISC DISEASE, CERVICAL SPINE   . Heart murmur    never has caused any problems  . History of chicken pox   . Hypertension   . Migraines    last one 2 wks ago- allergy related   . MITRAL VALVE PROLAPSE   . PSA, INCREASED   . TINNITUS, CHRONIC, BILATERAL      Social History   Socioeconomic History  . Marital status: Married    Spouse name: Not on file  . Number of children: Not on file  . Years of education: Not on  file  . Highest education level: Not on file  Occupational History  . Occupation: Retired  Scientific laboratory technician  . Financial resource strain: Not on file  . Food insecurity:    Worry: Not on file    Inability: Not on file  . Transportation needs:    Medical: Not on file    Non-medical: Not on file  Tobacco Use  . Smoking status: Current Every Day Smoker    Packs/day: 0.50    Years: 40.00    Pack years: 20.00    Types: Cigarettes  . Smokeless tobacco: Never Used  Substance and Sexual Activity  . Alcohol use: Yes    Alcohol/week: 7.0 standard drinks    Types: 7 Standard drinks or equivalent per week    Comment: 1 glass of scotch daily  . Drug use: No  . Sexual activity: Yes    Partners: Female    Comment: wife  Lifestyle  . Physical activity:    Days per week: Not on file    Minutes per session: Not on file  . Stress: Not on file  Relationships  . Social connections:    Talks on phone: Not on file    Gets together: Not on file    Attends religious service: Not on file    Active member of club or organization: Not on file    Attends meetings of  clubs or organizations: Not on file    Relationship status: Not on file  . Intimate partner violence:    Fear of current or ex partner: Not on file    Emotionally abused: Not on file    Physically abused: Not on file    Forced sexual activity: Not on file  Other Topics Concern  . Not on file  Social History Narrative  . Not on file    Past Surgical History:  Procedure Laterality Date  . COLONOSCOPY  2012   [place] hx polyps  . CYST REMOVAL NECK     And Face  . POLYPECTOMY     Colon  . Tonsillectome    . WISDOM TOOTH EXTRACTION      Family History  Problem Relation Age of Onset  . Colon cancer Mother 79       Deceased  . Lung cancer Mother   . Liver cancer Mother   . Breast cancer Sister   . Lymphoma Father 23       Deceased  . Diabetes Father   . Cancer Other        Paternal Grandparents  . Cancer Other         Maternal Grandparents  . Emphysema Paternal Grandfather   . Emphysema Maternal Grandfather   . Cancer Paternal Aunt   . Cancer Maternal Aunt   . Esophageal cancer Neg Hx   . Stomach cancer Neg Hx     No Known Allergies  Current Outpatient Medications on File Prior to Visit  Medication Sig Dispense Refill  . cetirizine (ZYRTEC) 5 MG tablet Take 5 mg by mouth daily.      . fluticasone (FLONASE) 50 MCG/ACT nasal spray USE 2 SPRAYS IN EACH NOSTRIL DAILY AS NEEDED FOR ALLERGIES OR RHINITIS 48 g 1  . fluticasone (FLONASE) 50 MCG/ACT nasal spray USE 2 SPRAYS IN EACH       NOSTRIL DAILY AS NEEDED FORALLERGIES OR RHINITIS 48 g 0  . losartan (COZAAR) 25 MG tablet Take 1 tablet (25 mg total) by mouth daily. 90 tablet 3  . montelukast (SINGULAIR) 10 MG tablet Take 1 tablet (10 mg total) by mouth at bedtime. 90 tablet 1  . naproxen sodium (ANAPROX) 220 MG tablet Take 220 mg by mouth as needed (for arthritis pain).     No current facility-administered medications on file prior to visit.     BP (!) 142/58   Pulse 62   Temp 99.1 F (37.3 C) (Oral)   Resp 16   Ht 5\' 9"  (1.753 m)   Wt 162 lb 12.8 oz (73.8 kg)   SpO2 99%   BMI 24.04 kg/m       Objective:   Physical Exam  General  Mental Status - Alert. General Appearance - Well groomed. Not in acute distress.  Skin Rashes- No Rashes.  HEENT Head- Normal. Ear Auditory Canal - Left- Normal. Right - Normal.Tympanic Membrane- Left- Normal. Right- Normal. Eye Sclera/Conjunctiva- Left- Normal. Right- Normal. Nose & Sinuses Nasal Mucosa- Left-  Boggy and Congested. Right-  Boggy and  Congested.Bilateral maxillary and frontal sinus pressure. Mouth & Throat Lips: Upper Lip- Normal: no dryness, cracking, pallor, cyanosis, or vesicular eruption. Lower Lip-Normal: no dryness, cracking, pallor, cyanosis or vesicular eruption. Buccal Mucosa- Bilateral- No Aphthous ulcers. Oropharynx- No Discharge or Erythema. Tonsils: Characteristics-  Bilateral- No Erythema or Congestion. Size/Enlargement- Bilateral- No enlargement. Discharge- bilateral-None.  Neck Neck- Supple. No Masses.   Chest and Lung Exam Auscultation: Breath Sounds:-Clear even  and unlabored.  Cardiovascular Auscultation:Rythm- Regular, rate and rhythm. Murmurs & Other Heart Sounds:Ausculatation of the heart reveal- No Murmurs.  Lymphatic Head & Neck General Head & Neck Lymphatics: Bilateral: Description- No Localized lymphadenopathy.      Assessment & Plan:  You appear to have bronchitis and sinusitis. Rest hydrate and tylenol for fever. I am prescribing cough medicine benzonatate, and doxycycline antibiotic. For your nasal congestion use flonase  For wheezing or sob, I am making albuterol inhaler available.  Chest xray today to evaluate lungs fields/particularly left lower lobe area.  Follow up in 7-10 days or as needed  [employer]

## 2019-01-07 NOTE — Patient Instructions (Addendum)
You appear to have bronchitis and sinusitis. Rest hydrate and tylenol for fever. I am prescribing cough medicine benzonatate, and doxycycline antibiotic. For your nasal congestion use flonase  For wheezing or sob, I am making albuterol inhaler available.  Chest xray today to evaluate lungs fields/particularly left lower lobe area.  Follow up in 7-10 days or as needed

## 2019-01-16 ENCOUNTER — Encounter: Payer: Self-pay | Admitting: Physician Assistant

## 2019-01-16 ENCOUNTER — Other Ambulatory Visit: Payer: Self-pay

## 2019-01-16 ENCOUNTER — Ambulatory Visit (INDEPENDENT_AMBULATORY_CARE_PROVIDER_SITE_OTHER): Payer: BLUE CROSS/BLUE SHIELD | Admitting: Physician Assistant

## 2019-01-16 VITALS — BP 130/64 | HR 60 | Temp 98.4°F | Resp 14 | Ht 69.0 in | Wt 163.0 lb

## 2019-01-16 DIAGNOSIS — I10 Essential (primary) hypertension: Secondary | ICD-10-CM

## 2019-01-16 DIAGNOSIS — J310 Chronic rhinitis: Secondary | ICD-10-CM

## 2019-01-16 DIAGNOSIS — Z0000 Encounter for general adult medical examination without abnormal findings: Secondary | ICD-10-CM

## 2019-01-16 DIAGNOSIS — Z125 Encounter for screening for malignant neoplasm of prostate: Secondary | ICD-10-CM

## 2019-01-16 LAB — COMPREHENSIVE METABOLIC PANEL
ALT: 17 U/L (ref 0–53)
AST: 21 U/L (ref 0–37)
Albumin: 4.2 g/dL (ref 3.5–5.2)
Alkaline Phosphatase: 61 U/L (ref 39–117)
BUN: 22 mg/dL (ref 6–23)
CHLORIDE: 105 meq/L (ref 96–112)
CO2: 29 mEq/L (ref 19–32)
Calcium: 9.2 mg/dL (ref 8.4–10.5)
Creatinine, Ser: 0.92 mg/dL (ref 0.40–1.50)
GFR: 83.71 mL/min (ref >60.00)
Glucose, Bld: 93 mg/dL (ref 70–99)
Potassium: 5 mEq/L (ref 3.5–5.1)
Sodium: 140 mEq/L (ref 135–145)
Total Bilirubin: 0.8 mg/dL (ref 0.2–1.2)
Total Protein: 6.6 g/dL (ref 6.0–8.3)

## 2019-01-16 LAB — CBC WITH DIFFERENTIAL/PLATELET
BASOS PCT: 1.3 % (ref 0.0–3.0)
Basophils Absolute: 0.1 10*3/uL (ref 0.0–0.1)
Eosinophils Absolute: 0.1 10*3/uL (ref 0.0–0.7)
Eosinophils Relative: 1.6 % (ref 0.0–5.0)
HCT: 45.5 % (ref 39.0–52.0)
Hemoglobin: 15.4 g/dL (ref 13.0–17.0)
Lymphocytes Relative: 28.9 % (ref 12.0–46.0)
Lymphs Abs: 2.4 10*3/uL (ref 0.7–4.0)
MCHC: 33.9 g/dL (ref 30.0–36.0)
MCV: 96.8 fl (ref 78.0–100.0)
Monocytes Absolute: 0.8 10*3/uL (ref 0.1–1.0)
Monocytes Relative: 9 % (ref 3.0–12.0)
Neutro Abs: 4.9 10*3/uL (ref 1.4–7.7)
Neutrophils Relative %: 59.2 % (ref 43.0–77.0)
Platelets: 281 10*3/uL (ref 150.0–400.0)
RBC: 4.7 Mil/uL (ref 4.22–5.81)
RDW: 13.8 % (ref 11.5–15.5)
WBC: 8.3 10*3/uL (ref 4.0–10.5)

## 2019-01-16 LAB — HEMOGLOBIN A1C: Hgb A1c MFr Bld: 5.6 % (ref 4.6–6.5)

## 2019-01-16 LAB — PSA: PSA: 5.87 ng/mL — ABNORMAL HIGH (ref 0.10–4.00)

## 2019-01-16 LAB — LIPID PANEL
Cholesterol: 137 mg/dL (ref 0–200)
HDL: 68.7 mg/dL (ref >39.00)
LDL Cholesterol: 61 mg/dL (ref 0–99)
NONHDL: 68.65
Total CHOL/HDL Ratio: 2
Triglycerides: 39 mg/dL (ref 0.0–149.0)
VLDL: 7.8 mg/dL (ref 0.0–40.0)

## 2019-01-16 MED ORDER — DICLOFENAC SODIUM 1 % TD GEL: 2.0000 g | Freq: Four times a day (QID) | TRANSDERMAL | 0 refills | Status: DC

## 2019-01-16 NOTE — Progress Notes (Signed)
Patient presents to clinic today for annual exam.  Patient is fasting for labs.  Acute Concerns: Patient endorses just getting over a sinus infection. Per EMR review, he was seen on 3.3.20 by PA Saguier at [facility]. Has finished antibiotic and feeling better. He notes he has dealt with recurrent sinus infections and consistent nasal congestion year round for many years despite allergy regimen. Was seen previously by [facility]. Felt they were not very helpful, only wanting to give him antibiotics. Would like a second opinion with another ENT.   Chronic Issues: Hypertension with Mitral Valve Disorder -- Patient is currently on a regimen of losartan 25 mg daily. Endorses taking medications as directed. Patient denies chest pain, palpitations, lightheadedness, dizziness, vision changes or frequent headaches.  BP Readings from Last 3 Encounters:  01/16/19 130/64  01/07/19 (!) 142/58  04/11/18 128/80   Health Maintenance: Immunizations -- Declines flu shot. Colonoscopy -- up-to-date.  Past Medical History:  Diagnosis Date  . Allergy   . Arthritis    hand, neck  . Back pain 09/11/2016   patient denies back pain but has neck pain  . Bicuspid aortic valve   . DEGENERATIVE DISC DISEASE, CERVICAL SPINE   . Heart murmur    never has caused any problems  . History of chicken pox   . Hypertension   . Migraines    last one 2 wks ago- allergy related   . MITRAL VALVE PROLAPSE   . PSA, INCREASED   . TINNITUS, CHRONIC, BILATERAL     Past Surgical History:  Procedure Laterality Date  . COLONOSCOPY  2012   [place] hx polyps  . CYST REMOVAL NECK     And Face  . POLYPECTOMY     Colon  . Tonsillectome    . WISDOM TOOTH EXTRACTION      Current Outpatient Medications on File Prior to Visit  Medication Sig Dispense Refill  . albuterol (PROVENTIL HFA;VENTOLIN HFA) 108 (90 Base) MCG/ACT inhaler Inhale 2 puffs into the lungs every 6 (six) hours as needed for wheezing or shortness of breath. 1  Inhaler 2  . benzonatate (TESSALON) 100 MG capsule Take 1 capsule (100 mg total) by mouth 3 (three) times daily as needed for cough. 30 capsule 0  . cetirizine (ZYRTEC) 5 MG tablet Take 5 mg by mouth daily.      Marland Kitchen doxycycline (VIBRA-TABS) 100 MG tablet Take 1 tablet (100 mg total) by mouth 2 (two) times daily. Can give caps or generic 20 tablet 0  . fluticasone (FLONASE) 50 MCG/ACT nasal spray USE 2 SPRAYS IN EACH NOSTRIL DAILY AS NEEDED FOR ALLERGIES OR RHINITIS 48 g 1  . fluticasone (FLONASE) 50 MCG/ACT nasal spray USE 2 SPRAYS IN EACH       NOSTRIL DAILY AS NEEDED FORALLERGIES OR RHINITIS 48 g 0  . losartan (COZAAR) 25 MG tablet Take 1 tablet (25 mg total) by mouth daily. 90 tablet 3  . montelukast (SINGULAIR) 10 MG tablet Take 1 tablet (10 mg total) by mouth at bedtime. 90 tablet 1  . naproxen sodium (ANAPROX) 220 MG tablet Take 220 mg by mouth as needed (for arthritis pain).     No current facility-administered medications on file prior to visit.     No Known Allergies  Family History  Problem Relation Age of Onset  . Colon cancer Mother 53       Deceased  . Lung cancer Mother   . Liver cancer Mother   . Breast cancer  Sister   . Lymphoma Father 76       Deceased  . Diabetes Father   . Cancer Other        Paternal Grandparents  . Cancer Other        Maternal Grandparents  . Emphysema Paternal Grandfather   . Emphysema Maternal Grandfather   . Cancer Paternal Aunt   . Cancer Maternal Aunt   . Esophageal cancer Neg Hx   . Stomach cancer Neg Hx     Social History   Socioeconomic History  . Marital status: Married    Spouse name: Not on file  . Number of children: Not on file  . Years of education: Not on file  . Highest education level: Not on file  Occupational History  . Occupation: Retired  Scientific laboratory technician  . Financial resource strain: Not on file  . Food insecurity:    Worry: Not on file    Inability: Not on file  . Transportation needs:    Medical: Not on file     Non-medical: Not on file  Tobacco Use  . Smoking status: Current Every Day Smoker    Packs/day: 0.50    Years: 40.00    Pack years: 20.00    Types: Cigarettes  . Smokeless tobacco: Never Used  Substance and Sexual Activity  . Alcohol use: Yes    Alcohol/week: 7.0 standard drinks    Types: 7 Standard drinks or equivalent per week    Comment: 1 glass of scotch daily  . Drug use: No  . Sexual activity: Yes    Partners: Female    Comment: wife  Lifestyle  . Physical activity:    Days per week: Not on file    Minutes per session: Not on file  . Stress: Not on file  Relationships  . Social connections:    Talks on phone: Not on file    Gets together: Not on file    Attends religious service: Not on file    Active member of club or organization: Not on file    Attends meetings of clubs or organizations: Not on file    Relationship status: Not on file  . Intimate partner violence:    Fear of current or ex partner: Not on file    Emotionally abused: Not on file    Physically abused: Not on file    Forced sexual activity: Not on file  Other Topics Concern  . Not on file  Social History Narrative  . Not on file    Review of Systems  Constitutional: Negative for fever and weight loss.  HENT: Negative for ear discharge, ear pain, hearing loss and tinnitus.   Eyes: Negative for blurred vision, double vision, photophobia and pain.  Respiratory: Negative for cough and shortness of breath.   Cardiovascular: Negative for chest pain and palpitations.  Gastrointestinal: Negative for abdominal pain, blood in stool, constipation, diarrhea, heartburn, melena, nausea and vomiting.  Genitourinary: Negative for dysuria, flank pain, frequency, hematuria and urgency.  Musculoskeletal: Positive for joint pain (chronic -- hands and knees, mild.). Negative for falls.  Neurological: Negative for dizziness, loss of consciousness and headaches.  Endo/Heme/Allergies: Negative for environmental  allergies.  Psychiatric/Behavioral: Negative for depression, hallucinations, substance abuse and suicidal ideas. The patient is not nervous/anxious and does not have insomnia.    BP 130/64   Pulse 60   Temp 98.4 F (36.9 C) (Oral)   Resp 14   Ht '5\' 9"'  (1.753 m)   Wt  163 lb (73.9 kg)   SpO2 98%   BMI 24.07 kg/m   Physical Exam Vitals signs reviewed.  Constitutional:      General: He is not in acute distress.    Appearance: He is well-developed. He is not diaphoretic.  HENT:     Head: Normocephalic and atraumatic.     Right Ear: Tympanic membrane, ear canal and external ear normal.     Left Ear: Tympanic membrane, ear canal and external ear normal.     Nose: Nose normal.     Mouth/Throat:     Pharynx: No posterior oropharyngeal erythema.  Eyes:     Conjunctiva/sclera: Conjunctivae normal.     Pupils: Pupils are equal, round, and reactive to light.  Neck:     Musculoskeletal: Neck supple.     Thyroid: No thyromegaly.  Cardiovascular:     Rate and Rhythm: Normal rate and regular rhythm.     Heart sounds: Normal heart sounds.  Pulmonary:     Effort: Pulmonary effort is normal. No respiratory distress.     Breath sounds: Normal breath sounds. No wheezing or rales.  Chest:     Chest wall: No tenderness.  Abdominal:     General: Bowel sounds are normal. There is no distension.     Palpations: Abdomen is soft. There is no mass.     Tenderness: There is no abdominal tenderness. There is no guarding or rebound.  Lymphadenopathy:     Cervical: No cervical adenopathy.  Skin:    General: Skin is warm and dry.     Findings: No rash.  Neurological:     Mental Status: He is alert and oriented to person, place, and time.     Cranial Nerves: No cranial nerve deficit.    Assessment/Plan: 1. Visit for preventive health examination Depression screen negative. Health Maintenance reviewed. Preventive schedule discussed and handout given in AVS. Will obtain fasting labs today.  -  Comp Met (CMET) - Lipid panel; Future - CBC with Differential/Platelet - Hemoglobin A1c - PSA - Lipid panel  2. Essential hypertension BP normotensive. Continue current medication regimen. Repeat fasting labs today. Follow-up with Cardiology as scheduled.   3. Chronic rhinitis With recurrent sinusitis. No active infection. Continue current medications. Referral to ENT placed. - Ambulatory referral to ENT  4. Prostate cancer screening PSA today as no recent PSA on file.     [name], PA-C

## 2019-01-22 DIAGNOSIS — R972 Elevated prostate specific antigen [PSA]: Secondary | ICD-10-CM | POA: Diagnosis not present

## 2019-01-22 DIAGNOSIS — N400 Enlarged prostate without lower urinary tract symptoms: Secondary | ICD-10-CM | POA: Diagnosis not present

## 2019-03-04 DIAGNOSIS — N400 Enlarged prostate without lower urinary tract symptoms: Secondary | ICD-10-CM | POA: Diagnosis not present

## 2019-03-05 NOTE — Telephone Encounter (Signed)
Patient keeping echo appt, has appt with DOD, Dr. [name] after echo. Okay per DOD, Dr. Rayann Heman today.     Cardiac Questionnaire:    Since your last visit or hospitalization:    1. Have you been having new or worsening chest pain? NO   2. Have you been having new or worsening shortness of breath? NO 3. Have you been having new or worsening leg swelling, wt gain, or increase in abdominal girth (pants fitting more tightly)? NO   4. Have you had any passing out spells? NO    *A YES to any of these questions would result in the appointment being kept. *If all the answers to these questions are NO, we should indicate that given the current situation regarding the worldwide coronarvirus pandemic, at the recommendation of the CDC, we are looking to limit gatherings in our waiting area, and thus will reschedule their appointment beyond four weeks from today.   _____________   [id number] Pre-Screening Questions:  . Do you currently have a fever? NO . Have you recently travelled on a cruise, internationally, or to [place], [place], [place], [place], [place], or [place], [place] [employer])  ? No . Have you been in contact with someone that is currently pending confirmation of Covid19 testing or has been confirmed to have the [place] virus? NO  . Are you currently experiencing fatigue or cough? NO

## 2019-03-08 NOTE — Progress Notes (Signed)
Date:  03/10/2019   ID:  David Cunningham, DOB 10/17/1956, MRN [id number]   Provider Location: Office  PCP:  Delorse Limber  Cardiologist:  [name] Electrophysiologist:  None   Evaluation Performed:  Follow-Up Visit In person   Chief Complaint:  AV Disease  History of Present Illness:     63 y.o. f/u for bicuspid AV disease. No family history of cardiac disease. No history of CAD or chest pain.  Poor diet with excess salt, smokes and has scotch most nights. Normal ETT 10/09/12 with HTN response.  Echo 03/30/17 EF 60-65% bicuspid AV with  Mild AS mean gradient 13 mmHg peak 24 mmHg mild AR and mild MR.  MRI/MRA 2015 with moderate aortic root enlargement 4.2 cm EF 72% no gadolinium uptake.  CT head [facility] 02/2015 no aneurysm normal carotid system  Dentition in good shape  Youngest son graduated from [employer] has job in [place]. Step daughter in [place] and oldest son in [place] echo from 03/13/18 EF 55-60% mild LVH normal size  Moderate AS mean gradient 19 mmHg peak 42 mmHg Moderate to Severe AR  LVIDS 37 mm LVIDD 52 mm  He feels great retired this year from [employer]  Echo today 03/10/19 moderate AS and moderate AR EF normal still stable   The patient does not have symptoms concerning for COVID-19 infection (fever, chills, cough, or new shortness of breath).    Past Medical History:  Diagnosis Date  . Allergy   . Arthritis    hand, neck  . Back pain 09/11/2016   patient denies back pain but has neck pain  . Bicuspid aortic valve   . DEGENERATIVE DISC DISEASE, CERVICAL SPINE   . Heart murmur    never has caused any problems  . History of chicken pox   . Hypertension   . Migraines    last one 2 wks ago- allergy related   . MITRAL VALVE PROLAPSE   . PSA, INCREASED   . TINNITUS, CHRONIC, BILATERAL    Past Surgical History:  Procedure Laterality Date  . COLONOSCOPY  2012   [place] hx polyps  . CYST REMOVAL NECK     And Face  .  POLYPECTOMY     Colon  . Tonsillectome    . WISDOM TOOTH EXTRACTION       Current Meds  Medication Sig  . cetirizine (ZYRTEC) 5 MG tablet Take 5 mg by mouth daily.    . diclofenac sodium (VOLTAREN) 1 % GEL Apply 2 g topically 4 (four) times daily.  . fluticasone (FLONASE) 50 MCG/ACT nasal spray USE 2 SPRAYS IN EACH NOSTRIL DAILY AS NEEDED FOR ALLERGIES OR RHINITIS  . losartan (COZAAR) 25 MG tablet Take 1 tablet (25 mg total) by mouth daily.  . montelukast (SINGULAIR) 10 MG tablet Take 1 tablet (10 mg total) by mouth at bedtime.  . naproxen sodium (ANAPROX) 220 MG tablet Take 220 mg by mouth as needed (for arthritis pain).     Allergies:   Patient has no known allergies.   Social History   Tobacco Use  . Smoking status: Current Every Day Smoker    Packs/day: 0.50    Years: 40.00    Pack years: 20.00    Types: Cigarettes  . Smokeless tobacco: Never Used  Substance Use Topics  . Alcohol use: Yes    Alcohol/week: 7.0 standard drinks    Types: 7 Standard drinks or equivalent per week    Comment:  1 glass of scotch daily  . Drug use: No     Family Hx: The patient's family history includes Breast cancer in his sister; Cancer in his maternal aunt, paternal aunt, and other family members; Colon cancer (age of onset: 54) in his mother; Diabetes in his father; Emphysema in his maternal grandfather and paternal grandfather; Liver cancer in his mother; Lung cancer in his mother; Lymphoma (age of onset: 29) in his father. There is no history of Esophageal cancer or Stomach cancer.  ROS:   Please see the history of present illness.     All other systems reviewed and are negative.   Prior CV studies:   The following studies were reviewed today:  Echo May 2019  Labs/Other Tests and Data Reviewed:    EKG:   03/14/18 SR rate 58 normal 03/10/19 SR rate 52 normal   Recent Labs: 01/16/2019: ALT 17; BUN 22; Creatinine, Ser 0.92; Hemoglobin 15.4; Platelets 281.0; Potassium 5.0; Sodium 140    Recent Lipid Panel Lab Results  Component Value Date/Time   CHOL 137 01/16/2019 10:12 AM   TRIG 39.0 01/16/2019 10:12 AM   HDL 68.70 01/16/2019 10:12 AM   CHOLHDL 2 01/16/2019 10:12 AM   LDLCALC 61 01/16/2019 10:12 AM    Wt Readings from Last 3 Encounters:  03/10/19 73 kg  01/16/19 73.9 kg  01/07/19 73.8 kg     Objective:    Vital Signs:  BP (!) 147/71   Pulse (!) 52   Ht 5\' 9"  (1.753 m)   Wt 73 kg   BMI 23.78 kg/m    Affect appropriate Healthy:  appears stated age HEENT: normal Neck supple with no adenopathy JVP normal no bruits no thyromegaly Lungs clear with no wheezing and good diaphragmatic motion Heart:  S1/S2 AS/AR murmur, no rub, gallop or click PMI normal Abdomen: benighn, BS positve, no tenderness, no AAA no bruit.  No HSM or HJR Distal pulses intact with no bruits No edema Neuro non-focal Skin warm and dry No muscular weakness   ASSESSMENT & PLAN:    Bicuspid AV:  With moderate AS and moderate to severe AR Compensated LV and no symptoms CXR no aortic root dilatation 01/07/19 and lung cancer screening CT ordered by primary Echo today stable gradients and moderate AR Allergies:  Continue zyrtec consider flonase  F/u primary  ETOH:  Encouraged more moderation in intake labs/LFTls with primary Smoking:  Counseled on smoking cessation for less than 10 minutes Has had script for Chantix Lung cancer screening CT per primary  HTN:  Increase cozaar to 50 mg daily   COVID-19 Education: The signs and symptoms of COVID-19 were discussed with the patient and how to seek care for testing (follow up with PCP or arrange E-visit).  The importance of social distancing was discussed today.  Time:   Today, I have spent 30 minutes with the patient with telehealth technology discussing the above problems.     Medication Adjustments/Labs and Tests Ordered: Current medicines are reviewed at length with the patient today.  Concerns regarding medicines are outlined above.    Tests Ordered:  Echo for bicuspid AV AS/AR in a year  Consider ETT in a year last normal 2013 given valve disease  Consider CTA in a year to size aorta  Medication Changes: No orders of the defined types were placed in this encounter.   Disposition:  Follow up in a year if echo stable   Signed, Jenkins Rouge, MD  03/10/2019 11:10 AM  [facility]

## 2019-03-10 ENCOUNTER — Encounter: Payer: Self-pay | Admitting: Physician Assistant

## 2019-03-10 ENCOUNTER — Other Ambulatory Visit: Payer: Self-pay | Admitting: Physician Assistant

## 2019-03-10 ENCOUNTER — Ambulatory Visit: Payer: BLUE CROSS/BLUE SHIELD | Admitting: Cardiovascular Disease

## 2019-03-10 ENCOUNTER — Ambulatory Visit (HOSPITAL_COMMUNITY): Payer: BLUE CROSS/BLUE SHIELD | Attending: Cardiovascular Disease

## 2019-03-10 ENCOUNTER — Other Ambulatory Visit: Payer: Self-pay

## 2019-03-10 ENCOUNTER — Ambulatory Visit (INDEPENDENT_AMBULATORY_CARE_PROVIDER_SITE_OTHER): Payer: BLUE CROSS/BLUE SHIELD | Admitting: Physician Assistant

## 2019-03-10 DIAGNOSIS — J0100 Acute maxillary sinusitis, unspecified: Secondary | ICD-10-CM | POA: Diagnosis not present

## 2019-03-10 DIAGNOSIS — Q231 Congenital insufficiency of aortic valve: Secondary | ICD-10-CM | POA: Insufficient documentation

## 2019-03-10 DIAGNOSIS — I350 Nonrheumatic aortic (valve) stenosis: Secondary | ICD-10-CM | POA: Insufficient documentation

## 2019-03-10 DIAGNOSIS — I359 Nonrheumatic aortic valve disorder, unspecified: Secondary | ICD-10-CM

## 2019-03-10 LAB — ECHOCARDIOGRAM COMPLETE
Height: 69 in
Weight: 2576 oz

## 2019-03-10 MED ORDER — LOSARTAN POTASSIUM 50 MG PO TABS: 50.0000 mg | ORAL_TABLET | Freq: Every day | ORAL | 3 refills | Status: DC

## 2019-03-10 MED ORDER — DICLOFENAC SODIUM 1 % TD GEL: 2.0000 g | Freq: Four times a day (QID) | TRANSDERMAL | 0 refills | Status: DC

## 2019-03-10 MED ORDER — AMOXICILLIN-POT CLAVULANATE 875-125 MG PO TABS: 1.0000 | ORAL_TABLET | Freq: Two times a day (BID) | ORAL | 0 refills | Status: DC

## 2019-03-10 NOTE — Patient Instructions (Signed)
Instructions sent to MyChart.    Please take antibiotic as directed.  Increase fluid intake.  Use Saline nasal spray.  Take a daily multivitamin. Continue allergy medications as directed.  Place a humidifier in the bedroom.  Please call or return clinic if symptoms are not improving.  Sinusitis Sinusitis is redness, soreness, and swelling (inflammation) of the paranasal sinuses. Paranasal sinuses are air pockets within the bones of your face (beneath the eyes, the middle of the forehead, or above the eyes). In healthy paranasal sinuses, mucus is able to drain out, and air is able to circulate through them by way of your nose. However, when your paranasal sinuses are inflamed, mucus and air can become trapped. This can allow bacteria and other germs to grow and cause infection. Sinusitis can develop quickly and last only a short time (acute) or continue over a long period (chronic). Sinusitis that lasts for more than 12 weeks is considered chronic.  CAUSES  Causes of sinusitis include:  Allergies.  Structural abnormalities, such as displacement of the cartilage that separates your nostrils (deviated septum), which can decrease the air flow through your nose and sinuses and affect sinus drainage.  Functional abnormalities, such as when the small hairs (cilia) that line your sinuses and help remove mucus do not work properly or are not present. SYMPTOMS  Symptoms of acute and chronic sinusitis are the same. The primary symptoms are pain and pressure around the affected sinuses. Other symptoms include:  Upper toothache.  Earache.  Headache.  Bad breath.  Decreased sense of smell and taste.  A cough, which worsens when you are lying flat.  Fatigue.  Fever.  Thick drainage from your nose, which often is green and may contain pus (purulent).  Swelling and warmth over the affected sinuses. DIAGNOSIS  Your caregiver will perform a physical exam. During the exam, your caregiver may:   Look in your nose for signs of abnormal growths in your nostrils (nasal polyps).  Tap over the affected sinus to check for signs of infection.  View the inside of your sinuses (endoscopy) with a special imaging device with a light attached (endoscope), which is inserted into your sinuses. If your caregiver suspects that you have chronic sinusitis, one or more of the following tests may be recommended:  Allergy tests.  Nasal culture A sample of mucus is taken from your nose and sent to a lab and screened for bacteria.  Nasal cytology A sample of mucus is taken from your nose and examined by your caregiver to determine if your sinusitis is related to an allergy. TREATMENT  Most cases of acute sinusitis are related to a viral infection and will resolve on their own within 10 days. Sometimes medicines are prescribed to help relieve symptoms (pain medicine, decongestants, nasal steroid sprays, or saline sprays).  However, for sinusitis related to a bacterial infection, your caregiver will prescribe antibiotic medicines. These are medicines that will help kill the bacteria causing the infection.  Rarely, sinusitis is caused by a fungal infection. In theses cases, your caregiver will prescribe antifungal medicine. For some cases of chronic sinusitis, surgery is needed. Generally, these are cases in which sinusitis recurs more than 3 times per year, despite other treatments. HOME CARE INSTRUCTIONS   Drink plenty of water. Water helps thin the mucus so your sinuses can drain more easily.  Use a humidifier.  Inhale steam 3 to 4 times a day (for example, sit in the bathroom with the shower running).  Apply a  warm, moist washcloth to your face 3 to 4 times a day, or as directed by your caregiver.  Use saline nasal sprays to help moisten and clean your sinuses.  Take over-the-counter or prescription medicines for pain, discomfort, or fever only as directed by your caregiver. SEEK IMMEDIATE MEDICAL  CARE IF:  You have increasing pain or severe headaches.  You have nausea, vomiting, or drowsiness.  You have swelling around your face.  You have vision problems.  You have a stiff neck.  You have difficulty breathing. MAKE SURE YOU:   Understand these instructions.  Will watch your condition.  Will get help right away if you are not doing well or get worse. Document Released: 10/23/2005 Document Revised: 01/15/2012 Document Reviewed: 11/07/2011 [facility] Patient Information 2014 [place], [place].

## 2019-03-10 NOTE — Progress Notes (Signed)
Virtual Visit via Video   I connected with patient on 03/10/19 at  9:20 AM EDT by a video enabled telemedicine application and verified that I am speaking with the correct person using two identifiers.  Location patient: Home Location provider: [facility], Office Persons participating in the virtual visit: Patient, Provider, [place] (Patina Moore)  I discussed the limitations of evaluation and management by telemedicine and the availability of in person appointments. The patient expressed understanding and agreed to proceed.  Subjective:   HPI:   Patient endorses several days with nasal congestion and sinus pressure, now having significant left maxillary pain starting yesterday. Notes tooth pain bilaterally. Denies ear pain, fever, aches. Denies recent travel or sick contact. Denies chest congestion, chest pain or SOB. Very rare cough. Is taking all of his allergy medications as directed.   ROS:   See pertinent positives and negatives per HPI.  Patient Active Problem List   Diagnosis Date Noted  . Encounter for screening for lung cancer 01/14/2018  . Right-sided thoracic back pain 09/11/2016  . Situational anxiety 09/04/2016  . Warts of foot 10/25/2015  . Visit for preventive health examination 10/06/2014  . Prostate cancer screening 10/06/2014  . Bicuspid aortic valve 09/24/2012  . Hypertension 09/24/2012  . DEGENERATIVE DISC DISEASE, CERVICAL SPINE 02/18/2010  . TOBACCO ABUSE 07/08/2009  . TINNITUS, CHRONIC, BILATERAL 07/08/2009  . Mitral valve disorder 07/08/2009  . Chronic rhinitis 07/08/2009  . PSA, INCREASED 07/08/2009    Social History   Tobacco Use  . Smoking status: Current Every Day Smoker    Packs/day: 0.50    Years: 40.00    Pack years: 20.00    Types: Cigarettes  . Smokeless tobacco: Never Used  Substance Use Topics  . Alcohol use: Yes    Alcohol/week: 7.0 standard drinks    Types: 7 Standard drinks or equivalent per week    Comment: 1 glass of  scotch daily    Current Outpatient Medications:  .  albuterol (PROVENTIL HFA;VENTOLIN HFA) 108 (90 Base) MCG/ACT inhaler, Inhale 2 puffs into the lungs every 6 (six) hours as needed for wheezing or shortness of breath., Disp: 1 Inhaler, Rfl: 2 .  cetirizine (ZYRTEC) 5 MG tablet, Take 5 mg by mouth daily.  , Disp: , Rfl:  .  diclofenac sodium (VOLTAREN) 1 % GEL, Apply 2 g topically 4 (four) times daily., Disp: 100 g, Rfl: 0 .  fluticasone (FLONASE) 50 MCG/ACT nasal spray, USE 2 SPRAYS IN EACH NOSTRIL DAILY AS NEEDED FOR ALLERGIES OR RHINITIS, Disp: 48 g, Rfl: 1 .  losartan (COZAAR) 25 MG tablet, Take 1 tablet (25 mg total) by mouth daily., Disp: 90 tablet, Rfl: 3 .  montelukast (SINGULAIR) 10 MG tablet, Take 1 tablet (10 mg total) by mouth at bedtime., Disp: 90 tablet, Rfl: 1 .  naproxen sodium (ANAPROX) 220 MG tablet, Take 220 mg by mouth as needed (for arthritis pain)., Disp: , Rfl:   No Known Allergies  Objective:   There were no vitals taken for this visit.  Patient is well-developed, well-nourished in no acute distress.  Resting comfortably at home.  Head is normocephalic, atraumatic.  No labored breathing.  Speech is clear and coherent with logical contest.  Patient is alert and oriented at baseline.  + TTP of left maxillary and frontal sinuses with help from patient. No TTP noted of R-sided sinuses.  Assessment and Plan:   1. Acute non-recurrent maxillary sinusitis Rx Augmentin.  Increase fluids.  Rest.  Saline nasal spray.  Probiotic.  Mucinex as directed.  Humidifier in bedroom. Continue allergy medications as directed.  Call or return to clinic if symptoms are not improving.  - amoxicillin-clavulanate (AUGMENTIN) 875-125 MG tablet; Take 1 tablet by mouth 2 (two) times daily.  Dispense: 14 tablet; Refill: [place], [place] 03/10/2019

## 2019-03-10 NOTE — Patient Instructions (Addendum)
Medication Instructions:  Your physician has recommended you make the following change in your medication:   1-INCREASE Losartan 50 mg by mouth daily.  If you need a refill on your cardiac medications before your next appointment, please call your pharmacy.   Lab work:  If you have labs (blood work) drawn today and your tests are completely normal, you will receive your results only by: Marland Kitchen MyChart Message (if you have MyChart) OR . A paper copy in the mail If you have any lab test that is abnormal or we need to change your treatment, we will call you to review the results.  Testing/Procedures: None ordered today.  Follow-Up: At [facility], you and your health needs are our priority.  As part of our continuing mission to provide you with exceptional heart care, we have created designated Provider Care Teams.  These Care Teams include your primary Cardiologist (physician) and Advanced Practice Providers (APPs -  Physician Assistants and Nurse Practitioners) who all work together to provide you with the care you need, when you need it. You will need a follow up appointment in 12 months.  Please call our office 2 months in advance to schedule this appointment.  You may see Dr. [name] or one of the following Advanced Practice Providers on your designated Care Team:   Truitt Merle, NP Cecilie Kicks, NP . Kathyrn Drown, NP

## 2019-03-10 NOTE — Progress Notes (Signed)
I have discussed the procedure for the virtual visit with the patient who has given consent to proceed with assessment and treatment.   Kaan Tosh S Brienne Liguori, CMA     

## 2019-03-14 ENCOUNTER — Ambulatory Visit: Payer: Self-pay | Admitting: Cardiovascular Disease

## 2019-03-19 DIAGNOSIS — J342 Deviated nasal septum: Secondary | ICD-10-CM | POA: Diagnosis not present

## 2019-03-19 DIAGNOSIS — J302 Other seasonal allergic rhinitis: Secondary | ICD-10-CM | POA: Diagnosis not present

## 2019-03-19 DIAGNOSIS — F17210 Nicotine dependence, cigarettes, uncomplicated: Secondary | ICD-10-CM | POA: Diagnosis not present

## 2019-03-19 DIAGNOSIS — J343 Hypertrophy of nasal turbinates: Secondary | ICD-10-CM | POA: Diagnosis not present

## 2019-04-21 DIAGNOSIS — J324 Chronic pansinusitis: Secondary | ICD-10-CM | POA: Insufficient documentation

## 2019-04-21 DIAGNOSIS — J342 Deviated nasal septum: Secondary | ICD-10-CM | POA: Diagnosis not present

## 2019-04-21 DIAGNOSIS — J0141 Acute recurrent pansinusitis: Secondary | ICD-10-CM | POA: Diagnosis not present

## 2019-04-21 DIAGNOSIS — J343 Hypertrophy of nasal turbinates: Secondary | ICD-10-CM | POA: Diagnosis not present

## 2019-04-21 DIAGNOSIS — J329 Chronic sinusitis, unspecified: Secondary | ICD-10-CM | POA: Diagnosis not present

## 2019-04-22 ENCOUNTER — Other Ambulatory Visit: Payer: Self-pay

## 2019-04-22 ENCOUNTER — Encounter: Payer: Self-pay | Admitting: Physician Assistant

## 2019-04-22 ENCOUNTER — Ambulatory Visit (INDEPENDENT_AMBULATORY_CARE_PROVIDER_SITE_OTHER): Payer: BC Managed Care – PPO | Admitting: Physician Assistant

## 2019-04-22 VITALS — Ht 69.0 in | Wt 162.0 lb

## 2019-04-22 DIAGNOSIS — M549 Dorsalgia, unspecified: Secondary | ICD-10-CM

## 2019-04-22 MED ORDER — CYCLOBENZAPRINE HCL 10 MG PO TABS: 10.0000 mg | ORAL_TABLET | Freq: Three times a day (TID) | ORAL | 0 refills | Status: DC | PRN

## 2019-04-22 MED ORDER — METHYLPREDNISOLONE 4 MG PO TBPK: ORAL_TABLET | ORAL | 0 refills | Status: DC

## 2019-04-22 NOTE — Progress Notes (Signed)
I have discussed the procedure for the virtual visit with the patient who has given consent to proceed with assessment and treatment.   David Cunningham S Jolynne Spurgin, CMA     

## 2019-04-22 NOTE — Progress Notes (Signed)
Virtual Visit via Telephone Note  I connected with David Cunningham on 04/22/19 at  8:30 AM EDT by telephone and verified that I am speaking with the correct person using two identifiers.  Location: Patient: Home Provider: [facility]   I discussed the limitations, risks, security and privacy concerns of performing an evaluation and management service by telephone and the availability of in person appointments. I also discussed with the patient that there may be a patient responsible charge related to this service. The patient expressed understanding and agreed to proceed.  History of Present Illness: Patient presents today via telephone as cannot access video platform at present for assessment of 1 week of left-sided cervical back and neck pain after working in the yard. Denies noted trauma or injury, just increase in physical labor. Notes pain is aching mostly but is having occasional radiation of pain down into the upper left arm with some intermittent tingling in the 3rd, 4th, 5th fingers of that hand. Denies weakness or decreased grip strength.    Observations/Objective: Patient is in no acute distress.  No labored breathing.  Speech is clear and coherent with logical content.  Patient is alert and oriented at baseline.   Assessment and Plan: 1. Other acute back pain In the absence of trauma and duration < 1 month, with no alarm signs, no imaging is indicated at present. Will start OTC Tylenol, Rx Medrol dose pack and Flexeril for nighttime use. Supportive measures reviewed. Strict return precautions reviewed with patient. Alarm signs/symptoms reviewed with patient that would prompt need for ER assessment.  - cyclobenzaprine (FLEXERIL) 10 MG tablet; Take 1 tablet (10 mg total) by mouth 3 (three) times daily as needed for muscle spasms.  Dispense: 30 tablet; Refill: 0 - methylPREDNISolone (MEDROL DOSEPAK) 4 MG TBPK tablet; Take following package directions.  Dispense: 21 tablet; Refill:  0  Follow Up Instructions:   I discussed the assessment and treatment plan with the patient. The patient was provided an opportunity to ask questions and all were answered. The patient agreed with the plan and demonstrated an understanding of the instructions.   The patient was advised to call back or seek an in-person evaluation if the symptoms worsen or if the condition fails to improve as anticipated.  I provided 103minutes of non-face-to-face time during this encounter.   [name], PA-C

## 2019-04-24 DIAGNOSIS — M5013 Cervical disc disorder with radiculopathy, cervicothoracic region: Secondary | ICD-10-CM | POA: Diagnosis not present

## 2019-04-24 DIAGNOSIS — M542 Cervicalgia: Secondary | ICD-10-CM | POA: Diagnosis not present

## 2019-04-24 DIAGNOSIS — M9901 Segmental and somatic dysfunction of cervical region: Secondary | ICD-10-CM | POA: Diagnosis not present

## 2019-04-24 DIAGNOSIS — M9902 Segmental and somatic dysfunction of thoracic region: Secondary | ICD-10-CM | POA: Diagnosis not present

## 2019-04-28 DIAGNOSIS — M5013 Cervical disc disorder with radiculopathy, cervicothoracic region: Secondary | ICD-10-CM | POA: Diagnosis not present

## 2019-04-28 DIAGNOSIS — M9901 Segmental and somatic dysfunction of cervical region: Secondary | ICD-10-CM | POA: Diagnosis not present

## 2019-04-28 DIAGNOSIS — M9902 Segmental and somatic dysfunction of thoracic region: Secondary | ICD-10-CM | POA: Diagnosis not present

## 2019-04-28 DIAGNOSIS — M542 Cervicalgia: Secondary | ICD-10-CM | POA: Diagnosis not present

## 2019-04-30 DIAGNOSIS — M9902 Segmental and somatic dysfunction of thoracic region: Secondary | ICD-10-CM | POA: Diagnosis not present

## 2019-04-30 DIAGNOSIS — M542 Cervicalgia: Secondary | ICD-10-CM | POA: Diagnosis not present

## 2019-04-30 DIAGNOSIS — M5013 Cervical disc disorder with radiculopathy, cervicothoracic region: Secondary | ICD-10-CM | POA: Diagnosis not present

## 2019-04-30 DIAGNOSIS — M9901 Segmental and somatic dysfunction of cervical region: Secondary | ICD-10-CM | POA: Diagnosis not present

## 2019-05-01 ENCOUNTER — Encounter: Payer: Self-pay | Admitting: Physician Assistant

## 2019-05-01 DIAGNOSIS — M542 Cervicalgia: Secondary | ICD-10-CM

## 2019-05-01 DIAGNOSIS — M9901 Segmental and somatic dysfunction of cervical region: Secondary | ICD-10-CM | POA: Diagnosis not present

## 2019-05-01 DIAGNOSIS — M9902 Segmental and somatic dysfunction of thoracic region: Secondary | ICD-10-CM | POA: Diagnosis not present

## 2019-05-01 DIAGNOSIS — M5013 Cervical disc disorder with radiculopathy, cervicothoracic region: Secondary | ICD-10-CM | POA: Diagnosis not present

## 2019-05-02 ENCOUNTER — Ambulatory Visit (HOSPITAL_BASED_OUTPATIENT_CLINIC_OR_DEPARTMENT_OTHER)
Admission: RE | Admit: 2019-05-02 | Discharge: 2019-05-02 | Disposition: A | Discharge status: Home / Self Care | Payer: BC Managed Care – PPO | Source: Ambulatory Visit | Attending: Physician Assistant | Admitting: Physician Assistant

## 2019-05-02 ENCOUNTER — Encounter: Payer: Self-pay | Admitting: Physician Assistant

## 2019-05-02 ENCOUNTER — Other Ambulatory Visit: Payer: Self-pay

## 2019-05-02 ENCOUNTER — Other Ambulatory Visit: Payer: Self-pay | Admitting: Physician Assistant

## 2019-05-02 DIAGNOSIS — M542 Cervicalgia: Secondary | ICD-10-CM

## 2019-05-02 DIAGNOSIS — M50123 Cervical disc disorder at C6-C7 level with radiculopathy: Secondary | ICD-10-CM | POA: Diagnosis not present

## 2019-05-05 DIAGNOSIS — M5013 Cervical disc disorder with radiculopathy, cervicothoracic region: Secondary | ICD-10-CM | POA: Diagnosis not present

## 2019-05-05 DIAGNOSIS — M542 Cervicalgia: Secondary | ICD-10-CM | POA: Diagnosis not present

## 2019-05-05 DIAGNOSIS — M9901 Segmental and somatic dysfunction of cervical region: Secondary | ICD-10-CM | POA: Diagnosis not present

## 2019-05-05 DIAGNOSIS — M9902 Segmental and somatic dysfunction of thoracic region: Secondary | ICD-10-CM | POA: Diagnosis not present

## 2019-05-05 MED ORDER — GABAPENTIN 100 MG PO CAPS: ORAL_CAPSULE | ORAL | 0 refills | Status: DC

## 2019-05-05 NOTE — Addendum Note (Signed)
Addended by: Brunetta Jeans on: 05/05/2019 10:53 AM   Modules accepted: Orders

## 2019-05-13 DIAGNOSIS — R972 Elevated prostate specific antigen [PSA]: Secondary | ICD-10-CM | POA: Diagnosis not present

## 2019-05-13 DIAGNOSIS — M50123 Cervical disc disorder at C6-C7 level with radiculopathy: Secondary | ICD-10-CM | POA: Diagnosis not present

## 2019-05-13 DIAGNOSIS — M4722 Other spondylosis with radiculopathy, cervical region: Secondary | ICD-10-CM | POA: Diagnosis not present

## 2019-05-13 DIAGNOSIS — M50122 Cervical disc disorder at C5-C6 level with radiculopathy: Secondary | ICD-10-CM | POA: Diagnosis not present

## 2019-05-19 DIAGNOSIS — N400 Enlarged prostate without lower urinary tract symptoms: Secondary | ICD-10-CM | POA: Diagnosis not present

## 2019-05-19 DIAGNOSIS — R972 Elevated prostate specific antigen [PSA]: Secondary | ICD-10-CM | POA: Diagnosis not present

## 2019-05-26 DIAGNOSIS — M4802 Spinal stenosis, cervical region: Secondary | ICD-10-CM | POA: Diagnosis not present

## 2019-05-26 DIAGNOSIS — M50223 Other cervical disc displacement at C6-C7 level: Secondary | ICD-10-CM | POA: Diagnosis not present

## 2019-05-26 DIAGNOSIS — M4722 Other spondylosis with radiculopathy, cervical region: Secondary | ICD-10-CM | POA: Diagnosis not present

## 2019-05-26 DIAGNOSIS — M542 Cervicalgia: Secondary | ICD-10-CM | POA: Diagnosis not present

## 2019-05-29 DIAGNOSIS — M50122 Cervical disc disorder at C5-C6 level with radiculopathy: Secondary | ICD-10-CM | POA: Diagnosis not present

## 2019-05-29 DIAGNOSIS — M50123 Cervical disc disorder at C6-C7 level with radiculopathy: Secondary | ICD-10-CM | POA: Diagnosis not present

## 2019-05-29 DIAGNOSIS — M4722 Other spondylosis with radiculopathy, cervical region: Secondary | ICD-10-CM | POA: Diagnosis not present

## 2019-06-04 ENCOUNTER — Telehealth: Payer: Self-pay | Admitting: *Deleted

## 2019-06-04 NOTE — Telephone Encounter (Signed)
Surgical clearance forms have been received from the [facility]. Charge sheets have been attached and placed in providers bin.

## 2019-06-04 NOTE — Telephone Encounter (Signed)
Patient will need appointment for updated labs and surgical clearance.

## 2019-06-04 NOTE — Telephone Encounter (Signed)
Patient has been scheduled for 06/09/2019 9am.  Pt has to have COVID testing prior to surgery and will have to quarantine after 11:00 on Monday.

## 2019-06-05 ENCOUNTER — Encounter: Payer: Self-pay | Admitting: Physician Assistant

## 2019-06-05 ENCOUNTER — Telehealth: Payer: Self-pay | Admitting: Cardiovascular Disease

## 2019-06-05 NOTE — Telephone Encounter (Signed)
   Primary Cardiologist: Jenkins Rouge, MD  Chart reviewed as part of pre-operative protocol coverage. Patient was contacted 06/05/2019 in reference to pre-operative risk assessment for pending surgery as outlined below.  David Cunningham was last seen on 03/10/19 by Dr. [name]. He has h/o bicuspid AV with moderate AS/AI by echo 03/2019, habitual ETOH, HTN, migraines, no known CAD/CHF. RCRI 0.4% indicating low risk of CV complications. He affirms he is doing well from cardiac standpoint without any symptoms - only having neck issues, prompting surgery. Therefore, based on ACC/AHA guidelines, the patient would be at acceptable risk for the planned procedure without further cardiovascular testing.   I do not see that he is on Aleve for any cardiac conditions (inflammatory or otherwise) so may hold as requested below.  I will route this recommendation to the requesting party via Epic fax function and remove from pre-op pool.  Please call with questions.  Charlie Pitter, PA-C 06/05/2019, 5:14 PM

## 2019-06-05 NOTE — Telephone Encounter (Signed)
   [facility] Pre-operative Risk Assessment    Request for surgical clearance:  1. What type of surgery is being performed? C5-6, C6-7 Anterior Cervical Discectomy   2. When is this surgery scheduled? 06/16/2019   3. What type of clearance is required (medical clearance vs. Pharmacy clearance to hold med vs. Both)? Both  4. Are there any medications that need to be held prior to surgery and how long? Aleve x 5 days   5. Practice name and name of physician performing surgery? [facility]    6. What is your office phone number? [phone]    7.   What is your office fax number? [phone]  8.   Anesthesia type (None, local, MAC, general) ? Not listed   _________________________________________________________________   (provider comments below)

## 2019-06-06 DIAGNOSIS — M50122 Cervical disc disorder at C5-C6 level with radiculopathy: Secondary | ICD-10-CM | POA: Diagnosis not present

## 2019-06-06 DIAGNOSIS — M50123 Cervical disc disorder at C6-C7 level with radiculopathy: Secondary | ICD-10-CM | POA: Diagnosis not present

## 2019-06-06 DIAGNOSIS — M4722 Other spondylosis with radiculopathy, cervical region: Secondary | ICD-10-CM | POA: Diagnosis not present

## 2019-06-06 DIAGNOSIS — M542 Cervicalgia: Secondary | ICD-10-CM | POA: Diagnosis not present

## 2019-06-07 HISTORY — PX: PROSTATE BIOPSY: SHX241

## 2019-06-09 ENCOUNTER — Ambulatory Visit: Payer: BC Managed Care – PPO | Admitting: Physician Assistant

## 2019-06-09 ENCOUNTER — Other Ambulatory Visit: Payer: Self-pay

## 2019-06-09 ENCOUNTER — Encounter: Payer: Self-pay | Admitting: Physician Assistant

## 2019-06-09 VITALS — BP 130/80 | HR 60 | Temp 98.2°F | Resp 16 | Ht 69.0 in | Wt 160.0 lb

## 2019-06-09 DIAGNOSIS — M4802 Spinal stenosis, cervical region: Secondary | ICD-10-CM | POA: Diagnosis not present

## 2019-06-09 DIAGNOSIS — Z01818 Encounter for other preprocedural examination: Secondary | ICD-10-CM | POA: Diagnosis not present

## 2019-06-09 DIAGNOSIS — Z01812 Encounter for preprocedural laboratory examination: Secondary | ICD-10-CM | POA: Diagnosis not present

## 2019-06-09 DIAGNOSIS — M4722 Other spondylosis with radiculopathy, cervical region: Secondary | ICD-10-CM | POA: Diagnosis not present

## 2019-06-09 DIAGNOSIS — Z1159 Encounter for screening for other viral diseases: Secondary | ICD-10-CM | POA: Diagnosis not present

## 2019-06-09 LAB — COMPREHENSIVE METABOLIC PANEL
ALT: 16 U/L (ref 0–53)
AST: 19 U/L (ref 0–37)
Albumin: 4.3 g/dL (ref 3.5–5.2)
Alkaline Phosphatase: 54 U/L (ref 39–117)
BUN: 18 mg/dL (ref 6–23)
CO2: 27 mEq/L (ref 19–32)
Calcium: 9.1 mg/dL (ref 8.4–10.5)
Chloride: 105 mEq/L (ref 96–112)
Creatinine, Ser: 0.89 mg/dL (ref 0.40–1.50)
GFR: 86.86 mL/min (ref >60.00)
Glucose, Bld: 92 mg/dL (ref 70–99)
Potassium: 5.1 mEq/L (ref 3.5–5.1)
Sodium: 139 mEq/L (ref 135–145)
Total Bilirubin: 0.7 mg/dL (ref 0.2–1.2)
Total Protein: 6.4 g/dL (ref 6.0–8.3)

## 2019-06-09 LAB — PROTIME-INR
INR: 1 ratio (ref 0.8–1.0)
Prothrombin Time: 11.7 s (ref 9.6–13.1)

## 2019-06-09 LAB — CBC
HCT: 46 % (ref 39.0–52.0)
Hemoglobin: 15.7 g/dL (ref 13.0–17.0)
MCHC: 34.2 g/dL (ref 30.0–36.0)
MCV: 99.2 fl (ref 78.0–100.0)
Platelets: 209 10*3/uL (ref 150.0–400.0)
RBC: 4.63 Mil/uL (ref 4.22–5.81)
RDW: 13.9 % (ref 11.5–15.5)
WBC: 7.8 10*3/uL (ref 4.0–10.5)

## 2019-06-09 NOTE — Patient Instructions (Signed)
You are cleared for surgery pending labs.  Should have results by later today or tomorrow morning.  These will be forwarded over to surgeon but will also be available via MyChart to take to your appointment tomorrow.  Take care!

## 2019-06-09 NOTE — Progress Notes (Signed)
Patient presents to clinic today for preoperative clearance.  Patient is scheduled next Monday with Dr. Rolena Infante at [facility] spine for repair of 2 herniated disks in cervical spine.  Patient has already received cardiac clearance from Dr. Jesse Fall office.  Patient denies any history of surgical complications including complication with anesthesia.  Denies easy bruising or bleeding.  Denies any fever, chills, malaise or fatigue.  Past Medical History:  Diagnosis Date  . Allergy   . Arthritis    hand, neck  . Back pain 09/11/2016   patient denies back pain but has neck pain  . Bicuspid aortic valve   . DEGENERATIVE DISC DISEASE, CERVICAL SPINE   . Heart murmur    never has caused any problems  . History of chicken pox   . Hypertension   . Migraines    last one 2 wks ago- allergy related   . MITRAL VALVE PROLAPSE   . PSA, INCREASED   . TINNITUS, CHRONIC, BILATERAL     Current Outpatient Medications on File Prior to Visit  Medication Sig Dispense Refill  . cetirizine (ZYRTEC) 5 MG tablet Take 5 mg by mouth daily.      . cyclobenzaprine (FLEXERIL) 10 MG tablet Take 1 tablet (10 mg total) by mouth 3 (three) times daily as needed for muscle spasms. 30 tablet 0  . diclofenac sodium (VOLTAREN) 1 % GEL Apply 2 g topically 4 (four) times daily. 100 g 0  . fluticasone (FLONASE) 50 MCG/ACT nasal spray USE 2 SPRAYS IN EACH NOSTRIL DAILY AS NEEDED FOR ALLERGIES OR RHINITIS 48 g 1  . gabapentin (NEURONTIN) 300 MG capsule TAKE 1 CAPSULE BY MOUTH THREE TIMES A DAY    . losartan (COZAAR) 50 MG tablet Take 1 tablet (50 mg total) by mouth daily. 90 tablet 3  . montelukast (SINGULAIR) 10 MG tablet Take 1 tablet (10 mg total) by mouth at bedtime. 90 tablet 1   No current facility-administered medications on file prior to visit.     No Known Allergies  Family History  Problem Relation Age of Onset  . Colon cancer Mother 66       Deceased  . Lung cancer Mother   . Liver cancer Mother   .  Breast cancer Sister   . Lymphoma Father 14       Deceased  . Diabetes Father   . Cancer Other        Paternal Grandparents  . Cancer Other        Maternal Grandparents  . Emphysema Paternal Grandfather   . Emphysema Maternal Grandfather   . Cancer Paternal Aunt   . Cancer Maternal Aunt   . Esophageal cancer Neg Hx   . Stomach cancer Neg Hx     Social History   Socioeconomic History  . Marital status: Married    Spouse name: Not on file  . Number of children: Not on file  . Years of education: Not on file  . Highest education level: Not on file  Occupational History  . Occupation: Retired  Scientific laboratory technician  . Financial resource strain: Not on file  . Food insecurity    Worry: Not on file    Inability: Not on file  . Transportation needs    Medical: Not on file    Non-medical: Not on file  Tobacco Use  . Smoking status: Current Every Day Smoker    Packs/day: 0.50    Years: 40.00    Pack years: 20.00  Types: Cigarettes  . Smokeless tobacco: Never Used  Substance and Sexual Activity  . Alcohol use: Yes    Alcohol/week: 7.0 standard drinks    Types: 7 Standard drinks or equivalent per week    Comment: 1 glass of scotch daily  . Drug use: No  . Sexual activity: Yes    Partners: Female    Comment: wife  Lifestyle  . Physical activity    Days per week: Not on file    Minutes per session: Not on file  . Stress: Not on file  Relationships  . Social Herbalist on phone: Not on file    Gets together: Not on file    Attends religious service: Not on file    Active member of club or organization: Not on file    Attends meetings of clubs or organizations: Not on file    Relationship status: Not on file  Other Topics Concern  . Not on file  Social History Narrative  . Not on file   Review of Systems - See HPI.  All other ROS are negative.  BP 130/80   Pulse 60   Temp 98.2 F (36.8 C) (Skin)   Resp 16   Ht _0  (1.753 m)   Wt 160 lb (72.6 kg)    SpO2 98%   BMI 23.63 kg/m   Physical Exam Vitals signs reviewed.  Constitutional:      Appearance: Normal appearance.  HENT:     Head: Normocephalic and atraumatic.     Right Ear: Tympanic membrane normal.     Left Ear: Tympanic membrane normal.     Nose: Nose normal.     Mouth/Throat:     Mouth: Mucous membranes are moist.  Eyes:     Conjunctiva/sclera: Conjunctivae normal.     Pupils: Pupils are equal, round, and reactive to light.  Neck:     Musculoskeletal: Neck supple.  Cardiovascular:     Rate and Rhythm: Normal rate and regular rhythm.     Pulses: Normal pulses.     Heart sounds: Murmur (Chronic from AS/AR) present.  Pulmonary:     Effort: Pulmonary effort is normal.     Breath sounds: Normal breath sounds.  Neurological:     General: No focal deficit present.     Mental Status: He is alert and oriented to person, place, and time.  Psychiatric:        Mood and Affect: Mood normal.    Assessment/Plan: 1. Preoperative clearance Patient has already received cardiac clearance from cardiologist.  Examination unremarkable.  Vitals stable.  Will obtain labs today.  If everything looks good, will send over his medical clearance for surgery. - Protime-INR ( [facility] ONLY) - CBC - Comp Met (CMET)   [name], PA-C

## 2019-06-10 DIAGNOSIS — Z01812 Encounter for preprocedural laboratory examination: Secondary | ICD-10-CM | POA: Diagnosis not present

## 2019-06-10 DIAGNOSIS — M4722 Other spondylosis with radiculopathy, cervical region: Secondary | ICD-10-CM | POA: Diagnosis not present

## 2019-06-10 DIAGNOSIS — Z1159 Encounter for screening for other viral diseases: Secondary | ICD-10-CM | POA: Diagnosis not present

## 2019-06-10 DIAGNOSIS — M4802 Spinal stenosis, cervical region: Secondary | ICD-10-CM | POA: Diagnosis not present

## 2019-06-16 DIAGNOSIS — M50123 Cervical disc disorder at C6-C7 level with radiculopathy: Secondary | ICD-10-CM | POA: Diagnosis not present

## 2019-06-16 DIAGNOSIS — I10 Essential (primary) hypertension: Secondary | ICD-10-CM | POA: Diagnosis not present

## 2019-06-16 DIAGNOSIS — Z791 Long term (current) use of non-steroidal anti-inflammatories (NSAID): Secondary | ICD-10-CM | POA: Diagnosis not present

## 2019-06-16 DIAGNOSIS — F17210 Nicotine dependence, cigarettes, uncomplicated: Secondary | ICD-10-CM | POA: Diagnosis not present

## 2019-06-16 DIAGNOSIS — Z79899 Other long term (current) drug therapy: Secondary | ICD-10-CM | POA: Diagnosis not present

## 2019-06-16 DIAGNOSIS — Z7951 Long term (current) use of inhaled steroids: Secondary | ICD-10-CM | POA: Diagnosis not present

## 2019-06-16 DIAGNOSIS — M50122 Cervical disc disorder at C5-C6 level with radiculopathy: Secondary | ICD-10-CM | POA: Diagnosis not present

## 2019-06-16 DIAGNOSIS — Z981 Arthrodesis status: Secondary | ICD-10-CM | POA: Diagnosis not present

## 2019-06-16 DIAGNOSIS — M4722 Other spondylosis with radiculopathy, cervical region: Secondary | ICD-10-CM | POA: Insufficient documentation

## 2019-07-02 DIAGNOSIS — N4289 Other specified disorders of prostate: Secondary | ICD-10-CM | POA: Diagnosis not present

## 2019-07-02 DIAGNOSIS — R972 Elevated prostate specific antigen [PSA]: Secondary | ICD-10-CM | POA: Diagnosis not present

## 2019-07-15 DIAGNOSIS — M50122 Cervical disc disorder at C5-C6 level with radiculopathy: Secondary | ICD-10-CM | POA: Diagnosis not present

## 2019-08-08 ENCOUNTER — Encounter: Payer: Self-pay | Admitting: Physician Assistant

## 2019-08-08 DIAGNOSIS — J302 Other seasonal allergic rhinitis: Secondary | ICD-10-CM

## 2019-08-08 MED ORDER — MONTELUKAST SODIUM 10 MG PO TABS: 10.0000 mg | ORAL_TABLET | Freq: Every day | ORAL | 1 refills | Status: DC

## 2019-08-19 DIAGNOSIS — M4322 Fusion of spine, cervical region: Secondary | ICD-10-CM | POA: Diagnosis not present

## 2019-09-16 ENCOUNTER — Encounter: Payer: Self-pay | Admitting: Internal Medicine

## 2019-09-16 ENCOUNTER — Encounter: Payer: Self-pay | Admitting: Physician Assistant

## 2019-09-19 DIAGNOSIS — Z1159 Encounter for screening for other viral diseases: Secondary | ICD-10-CM | POA: Diagnosis not present

## 2019-09-23 ENCOUNTER — Other Ambulatory Visit: Payer: Self-pay | Admitting: Otolaryngology

## 2019-09-23 ENCOUNTER — Telehealth: Payer: Self-pay | Admitting: Cardiovascular Disease

## 2019-09-23 ENCOUNTER — Encounter (HOSPITAL_BASED_OUTPATIENT_CLINIC_OR_DEPARTMENT_OTHER): Payer: Self-pay | Admitting: *Deleted

## 2019-09-23 ENCOUNTER — Other Ambulatory Visit: Payer: Self-pay

## 2019-09-23 NOTE — Progress Notes (Signed)
Chart and ECHO results reviewed with Dr Ambrose Pancoast, [place] for [facility].

## 2019-09-23 NOTE — Telephone Encounter (Signed)
   Primary Cardiologist: Jenkins Rouge, MD  Chart reviewed as part of pre-operative protocol coverage. Given past medical history and time since last visit, based on ACC/AHA guidelines, Johathan Senger would be at acceptable risk for the planned procedure without further cardiovascular testing.   Patient was last seen by Dr.Nishan 03/10/2019 and was doing well from a cardiac perspective.  Spoke with the patient today, 09/23/2019 and he reports no shortness of breath, dizziness or syncope and is doing very well.  Can perform greater than 4 METS of physical activity without symptoms.  No medications will need to be held prior to surgery per surgical request note.  I will route this recommendation to the requesting party via Epic fax function and remove from pre-op pool.  Please call with questions.  Kathyrn Drown, NP 09/23/2019, 2:23 PM

## 2019-09-23 NOTE — Telephone Encounter (Signed)
° °  [facility] Pre-operative Risk Assessment    Request for surgical clearance:  1. What type of surgery is being performed? Septoplasty & Endoscopic sinus surgery  2. When is this surgery scheduled? 09/24/19  3. What type of clearance is required (medical clearance vs. Pharmacy clearance to hold med vs. Both)? Cardiac  4. Are there any medications that need to be held prior to surgery and how long? No   5. Practice name and name of physician performing surgery? [facility], Dr. Georgina Pillion   6. What is your office phone number [phone]   7.   What is your office fax number [phone]  8.   Anesthesia type (None, local, MAC, general) ? General    David Cunningham 09/23/2019, 9:13 AM  _________________________________________________________________   (provider comments below)

## 2019-09-23 NOTE — Telephone Encounter (Signed)
LMTCB 09/23/19 for pre-op clearance

## 2019-09-24 ENCOUNTER — Other Ambulatory Visit (HOSPITAL_COMMUNITY)
Admission: RE | Admit: 2019-09-24 | Discharge: 2019-09-24 | Disposition: A | Discharge status: Home / Self Care | Payer: BC Managed Care – PPO | Source: Ambulatory Visit | Attending: Otolaryngology | Admitting: Otolaryngology

## 2019-09-24 DIAGNOSIS — Z01812 Encounter for preprocedural laboratory examination: Secondary | ICD-10-CM | POA: Diagnosis not present

## 2019-09-24 DIAGNOSIS — Z20828 Contact with and (suspected) exposure to other viral communicable diseases: Secondary | ICD-10-CM | POA: Diagnosis not present

## 2019-09-24 LAB — SARS CORONAVIRUS 2 (TAT 6-24 HRS): SARS Coronavirus 2: NEGATIVE

## 2019-09-25 ENCOUNTER — Ambulatory Visit (HOSPITAL_BASED_OUTPATIENT_CLINIC_OR_DEPARTMENT_OTHER)
Admission: RE | Admit: 2019-09-25 | Discharge: 2019-09-25 | Disposition: A | Discharge status: Home / Self Care | Payer: BC Managed Care – PPO | Attending: Otolaryngology | Admitting: Otolaryngology

## 2019-09-25 ENCOUNTER — Other Ambulatory Visit: Payer: Self-pay

## 2019-09-25 ENCOUNTER — Encounter (HOSPITAL_BASED_OUTPATIENT_CLINIC_OR_DEPARTMENT_OTHER): Payer: Self-pay | Admitting: Anesthesiology

## 2019-09-25 ENCOUNTER — Encounter (HOSPITAL_BASED_OUTPATIENT_CLINIC_OR_DEPARTMENT_OTHER)
Admission: RE | Disposition: A | Discharge status: Home / Self Care | Payer: Self-pay | Source: Home / Self Care | Attending: Otolaryngology

## 2019-09-25 ENCOUNTER — Ambulatory Visit (HOSPITAL_BASED_OUTPATIENT_CLINIC_OR_DEPARTMENT_OTHER): Payer: BC Managed Care – PPO | Admitting: Certified Registered"

## 2019-09-25 DIAGNOSIS — J322 Chronic ethmoidal sinusitis: Secondary | ICD-10-CM | POA: Diagnosis not present

## 2019-09-25 DIAGNOSIS — J329 Chronic sinusitis, unspecified: Secondary | ICD-10-CM | POA: Insufficient documentation

## 2019-09-25 DIAGNOSIS — M1990 Unspecified osteoarthritis, unspecified site: Secondary | ICD-10-CM | POA: Insufficient documentation

## 2019-09-25 DIAGNOSIS — J338 Other polyp of sinus: Secondary | ICD-10-CM | POA: Diagnosis not present

## 2019-09-25 DIAGNOSIS — Z20828 Contact with and (suspected) exposure to other viral communicable diseases: Secondary | ICD-10-CM | POA: Insufficient documentation

## 2019-09-25 DIAGNOSIS — J3489 Other specified disorders of nose and nasal sinuses: Secondary | ICD-10-CM | POA: Diagnosis not present

## 2019-09-25 DIAGNOSIS — F17210 Nicotine dependence, cigarettes, uncomplicated: Secondary | ICD-10-CM | POA: Diagnosis not present

## 2019-09-25 DIAGNOSIS — I080 Rheumatic disorders of both mitral and aortic valves: Secondary | ICD-10-CM | POA: Diagnosis not present

## 2019-09-25 DIAGNOSIS — J320 Chronic maxillary sinusitis: Secondary | ICD-10-CM | POA: Diagnosis not present

## 2019-09-25 DIAGNOSIS — J342 Deviated nasal septum: Secondary | ICD-10-CM | POA: Insufficient documentation

## 2019-09-25 DIAGNOSIS — Z791 Long term (current) use of non-steroidal anti-inflammatories (NSAID): Secondary | ICD-10-CM | POA: Insufficient documentation

## 2019-09-25 DIAGNOSIS — G43909 Migraine, unspecified, not intractable, without status migrainosus: Secondary | ICD-10-CM | POA: Diagnosis not present

## 2019-09-25 DIAGNOSIS — I10 Essential (primary) hypertension: Secondary | ICD-10-CM | POA: Diagnosis not present

## 2019-09-25 DIAGNOSIS — J343 Hypertrophy of nasal turbinates: Secondary | ICD-10-CM | POA: Diagnosis not present

## 2019-09-25 DIAGNOSIS — Z79899 Other long term (current) drug therapy: Secondary | ICD-10-CM | POA: Diagnosis not present

## 2019-09-25 DIAGNOSIS — Z7951 Long term (current) use of inhaled steroids: Secondary | ICD-10-CM | POA: Insufficient documentation

## 2019-09-25 HISTORY — PX: NASAL SEPTOPLASTY W/ TURBINOPLASTY: SHX2070

## 2019-09-25 HISTORY — PX: SINUS ENDO WITH FUSION: SHX5329

## 2019-09-25 SURGERY — SEPTOPLASTY, NOSE, WITH NASAL TURBINATE REDUCTION
Anesthesia: General | Site: Nose | Laterality: Bilateral

## 2019-09-25 MED ORDER — ATROPINE SULFATE 0.4 MG/ML IJ SOLN
INTRAMUSCULAR | Status: AC
  Filled 2019-09-25: qty 1

## 2019-09-25 MED ORDER — PROPOFOL 500 MG/50ML IV EMUL
INTRAVENOUS | Status: AC
  Filled 2019-09-25: qty 150

## 2019-09-25 MED ORDER — TRIAMCINOLONE ACETONIDE 40 MG/ML IJ SUSP
INTRAMUSCULAR | Status: AC
  Filled 2019-09-25: qty 5

## 2019-09-25 MED ORDER — CIPROFLOXACIN-DEXAMETHASONE 0.3-0.1 % OT SUSP
OTIC | Status: AC
  Filled 2019-09-25: qty 7.5

## 2019-09-25 MED ORDER — FENTANYL CITRATE (PF) 100 MCG/2ML IJ SOLN
INTRAMUSCULAR | Status: DC | PRN
  Administered 2019-09-25 (×2): 50 ug via INTRAVENOUS
  Administered 2019-09-25: 100 ug via INTRAVENOUS
  Administered 2019-09-25: 50 ug via INTRAVENOUS

## 2019-09-25 MED ORDER — CEFAZOLIN SODIUM-DEXTROSE 2-4 GM/100ML-% IV SOLN
INTRAVENOUS | Status: AC
  Filled 2019-09-25: qty 100

## 2019-09-25 MED ORDER — MUPIROCIN 2 % EX OINT
TOPICAL_OINTMENT | CUTANEOUS | Status: AC
  Filled 2019-09-25: qty 22

## 2019-09-25 MED ORDER — FENTANYL CITRATE (PF) 100 MCG/2ML IJ SOLN
INTRAMUSCULAR | Status: AC
  Filled 2019-09-25: qty 2

## 2019-09-25 MED ORDER — HYDROMORPHONE HCL 1 MG/ML IJ SOLN: 0.2500 mg | INTRAMUSCULAR | Status: DC | PRN

## 2019-09-25 MED ORDER — LACTATED RINGERS IV SOLN
INTRAVENOUS | Status: DC
  Administered 2019-09-25 (×2): via INTRAVENOUS

## 2019-09-25 MED ORDER — OXYCODONE HCL 5 MG PO TABS: 5.0000 mg | ORAL_TABLET | Freq: Once | ORAL | Status: DC | PRN

## 2019-09-25 MED ORDER — EPHEDRINE SULFATE-NACL 50-0.9 MG/10ML-% IV SOSY
PREFILLED_SYRINGE | INTRAVENOUS | Status: DC | PRN
  Administered 2019-09-25: 10 mg via INTRAVENOUS

## 2019-09-25 MED ORDER — EPHEDRINE 5 MG/ML INJ
INTRAVENOUS | Status: AC
  Filled 2019-09-25: qty 10

## 2019-09-25 MED ORDER — LIDOCAINE 2% (20 MG/ML) 5 ML SYRINGE
INTRAMUSCULAR | Status: DC | PRN
  Administered 2019-09-25: 60 mg via INTRAVENOUS

## 2019-09-25 MED ORDER — ROCURONIUM BROMIDE 100 MG/10ML IV SOLN
INTRAVENOUS | Status: DC | PRN
  Administered 2019-09-25 (×2): 50 mg via INTRAVENOUS

## 2019-09-25 MED ORDER — FENTANYL CITRATE (PF) 100 MCG/2ML IJ SOLN: 50.0000 ug | INTRAMUSCULAR | Status: DC | PRN

## 2019-09-25 MED ORDER — OXYMETAZOLINE HCL 0.05 % NA SOLN
NASAL | Status: AC
  Filled 2019-09-25: qty 30

## 2019-09-25 MED ORDER — PROPOFOL 10 MG/ML IV BOLUS
INTRAVENOUS | Status: DC | PRN
  Administered 2019-09-25: 30 mg via INTRAVENOUS
  Administered 2019-09-25: 10 mg via INTRAVENOUS
  Administered 2019-09-25: 50 mg via INTRAVENOUS
  Administered 2019-09-25: 200 mg via INTRAVENOUS

## 2019-09-25 MED ORDER — MIDAZOLAM HCL 2 MG/2ML IJ SOLN
INTRAMUSCULAR | Status: AC
  Filled 2019-09-25: qty 2

## 2019-09-25 MED ORDER — MUPIROCIN 2 % EX OINT
TOPICAL_OINTMENT | CUTANEOUS | Status: DC | PRN
  Administered 2019-09-25: 1 via TOPICAL

## 2019-09-25 MED ORDER — MEPERIDINE HCL 25 MG/ML IJ SOLN: 6.2500 mg | INTRAMUSCULAR | Status: DC | PRN

## 2019-09-25 MED ORDER — CEPHALEXIN 500 MG PO CAPS: 500.0000 mg | ORAL_CAPSULE | Freq: Three times a day (TID) | ORAL | 0 refills | Status: AC

## 2019-09-25 MED ORDER — OXYMETAZOLINE HCL 0.05 % NA SOLN
NASAL | Status: DC | PRN
  Administered 2019-09-25: 1 via TOPICAL

## 2019-09-25 MED ORDER — CEFAZOLIN SODIUM-DEXTROSE 2-4 GM/100ML-% IV SOLN
2.0000 g | INTRAVENOUS | Status: AC
  Administered 2019-09-25: 2 g via INTRAVENOUS

## 2019-09-25 MED ORDER — MIDAZOLAM HCL 5 MG/5ML IJ SOLN
INTRAMUSCULAR | Status: DC | PRN
  Administered 2019-09-25: 2 mg via INTRAVENOUS

## 2019-09-25 MED ORDER — OXYCODONE HCL 5 MG/5ML PO SOLN: 5.0000 mg | Freq: Once | ORAL | Status: DC | PRN

## 2019-09-25 MED ORDER — BACITRACIN ZINC 500 UNIT/GM EX OINT
TOPICAL_OINTMENT | CUTANEOUS | Status: AC
  Filled 2019-09-25: qty 28.35

## 2019-09-25 MED ORDER — SUCCINYLCHOLINE CHLORIDE 200 MG/10ML IV SOSY
PREFILLED_SYRINGE | INTRAVENOUS | Status: AC
  Filled 2019-09-25: qty 10

## 2019-09-25 MED ORDER — SUGAMMADEX SODIUM 200 MG/2ML IV SOLN
INTRAVENOUS | Status: DC | PRN
  Administered 2019-09-25: 200 mg via INTRAVENOUS

## 2019-09-25 MED ORDER — LIDOCAINE-EPINEPHRINE 1 %-1:100000 IJ SOLN
INTRAMUSCULAR | Status: AC
  Filled 2019-09-25: qty 1

## 2019-09-25 MED ORDER — MIDAZOLAM HCL 2 MG/2ML IJ SOLN: 1.0000 mg | INTRAMUSCULAR | Status: DC | PRN

## 2019-09-25 MED ORDER — ONDANSETRON HCL 4 MG/2ML IJ SOLN
INTRAMUSCULAR | Status: DC | PRN
  Administered 2019-09-25: 4 mg via INTRAVENOUS

## 2019-09-25 MED ORDER — KETOROLAC TROMETHAMINE 30 MG/ML IJ SOLN: 30.0000 mg | Freq: Once | INTRAMUSCULAR | Status: DC | PRN

## 2019-09-25 MED ORDER — DEXMEDETOMIDINE HCL IN NACL 400 MCG/100ML IV SOLN
INTRAVENOUS | Status: DC | PRN
  Administered 2019-09-25: 8 ug via INTRAVENOUS
  Administered 2019-09-25: 12 ug via INTRAVENOUS

## 2019-09-25 MED ORDER — DEXTROSE 50 % IV SOLN
INTRAVENOUS | Status: AC
  Filled 2019-09-25: qty 50

## 2019-09-25 MED ORDER — ACETAMINOPHEN 500 MG PO TABS: 1000.0000 mg | ORAL_TABLET | Freq: Once | ORAL | Status: DC

## 2019-09-25 MED ORDER — PROMETHAZINE HCL 25 MG/ML IJ SOLN: 6.2500 mg | INTRAMUSCULAR | Status: DC | PRN

## 2019-09-25 MED ORDER — LIDOCAINE-EPINEPHRINE 1 %-1:100000 IJ SOLN
INTRAMUSCULAR | Status: DC | PRN
  Administered 2019-09-25: 5 mL

## 2019-09-25 MED ORDER — MUPIROCIN CALCIUM 2 % EX CREA: TOPICAL_CREAM | CUTANEOUS | Status: DC | PRN

## 2019-09-25 MED ORDER — DEXAMETHASONE SODIUM PHOSPHATE 4 MG/ML IJ SOLN
INTRAMUSCULAR | Status: DC | PRN
  Administered 2019-09-25: 10 mg via INTRAVENOUS

## 2019-09-25 SURGICAL SUPPLY — 71 items
ATTRACTOMAT 16X20 MAGNETIC DRP (DRAPES) IMPLANT
BLADE RAD40 ROTATE 4M 4 5PK (BLADE) IMPLANT
BLADE RAD40 ROTATE 4M 4MM 5PK (BLADE)
BLADE RAD60 ROTATE M4 4 5PK (BLADE) IMPLANT
BLADE RAD60 ROTATE M4 4MM 5PK (BLADE)
BLADE ROTATE RAD 12 4 M4 (BLADE) IMPLANT
BLADE ROTATE RAD 12 4MM M4 (BLADE)
BLADE ROTATE RAD 40 4 M4 (BLADE) ×1 IMPLANT
BLADE ROTATE RAD 40 4MM M4 (BLADE) ×1
BLADE ROTATE TRICUT 4MX13CM M4 (BLADE) ×1
BLADE ROTATE TRICUT 4X13 M4 (BLADE) ×2 IMPLANT
BLADE SURG 15 STRL LF DISP TIS (BLADE) IMPLANT
BLADE SURG 15 STRL SS (BLADE)
BLADE TRICUT ROTATE M4 4 5PK (BLADE) IMPLANT
BLADE TRICUT ROTATE M4 4MM 5PK (BLADE)
BUR HS RAD FRONTAL 3 (BURR) IMPLANT
BUR HS RAD FRONTAL 3MM (BURR)
CANISTER SUC SOCK COL 7IN (MISCELLANEOUS) ×3 IMPLANT
CANISTER SUCT 1200ML W/VALVE (MISCELLANEOUS) ×6 IMPLANT
COAGULATOR SUCT 6 FR SWTCH (ELECTROSURGICAL) ×1
COAGULATOR SUCT 8FR VV (MISCELLANEOUS) IMPLANT
COAGULATOR SUCT SWTCH 10FR 6 (ELECTROSURGICAL) ×1 IMPLANT
COVER WAND RF STERILE (DRAPES) IMPLANT
DECANTER SPIKE VIAL GLASS SM (MISCELLANEOUS) IMPLANT
DRAPE SURG 17X23 STRL (DRAPES) ×3 IMPLANT
DRESSING NASAL KENNEDY 3.5X.9 (MISCELLANEOUS) IMPLANT
DRSG NASAL KENNEDY 3.5X.9 (MISCELLANEOUS)
DRSG NASOPORE 8CM (GAUZE/BANDAGES/DRESSINGS) ×2 IMPLANT
DRSG TELFA 3X8 NADH (GAUZE/BANDAGES/DRESSINGS) IMPLANT
ELECT COATED BLADE 2.86 ST (ELECTRODE) IMPLANT
ELECT REM PT RETURN 9FT ADLT (ELECTROSURGICAL) ×3
ELECTRODE REM PT RTRN 9FT ADLT (ELECTROSURGICAL) IMPLANT
GLOVE BIOGEL M 7.0 STRL (GLOVE) ×6 IMPLANT
GLOVE BIOGEL PI IND STRL 6.5 (GLOVE) IMPLANT
GLOVE BIOGEL PI INDICATOR 6.5 (GLOVE) ×2
GLOVE SURG SS PI 6.5 STRL IVOR (GLOVE) ×2 IMPLANT
GOWN STRL REUS W/ TWL LRG LVL3 (GOWN DISPOSABLE) ×2 IMPLANT
GOWN STRL REUS W/TWL LRG LVL3 (GOWN DISPOSABLE) ×6
IV NS 1000ML (IV SOLUTION)
IV NS 1000ML BAXH (IV SOLUTION) IMPLANT
IV NS 500ML (IV SOLUTION) ×3
IV NS 500ML BAXH (IV SOLUTION) ×1 IMPLANT
IV SET EXT 30 76VOL 4 MALE LL (IV SETS) ×3 IMPLANT
NDL PRECISIONGLIDE 27X1.5 (NEEDLE) ×1 IMPLANT
NEEDLE PRECISIONGLIDE 27X1.5 (NEEDLE) ×3 IMPLANT
NS IRRIG 1000ML POUR BTL (IV SOLUTION) ×2 IMPLANT
PACK BASIN DAY SURGERY FS (CUSTOM PROCEDURE TRAY) ×3 IMPLANT
PACK ENT DAY SURGERY (CUSTOM PROCEDURE TRAY) ×3 IMPLANT
PAD DRESSING TELFA 3X8 NADH (GAUZE/BANDAGES/DRESSINGS) IMPLANT
PENCIL SMOKE EVACUATOR (MISCELLANEOUS) IMPLANT
SLEEVE SCD COMPRESS KNEE MED (MISCELLANEOUS) ×2 IMPLANT
SOLUTION BUTLER CLEAR DIP (MISCELLANEOUS) ×3 IMPLANT
SPLINT NASAL AIRWAY SILICONE (MISCELLANEOUS) ×3 IMPLANT
SPONGE GAUZE 2X2 8PLY STER LF (GAUZE/BANDAGES/DRESSINGS) ×1
SPONGE GAUZE 2X2 8PLY STRL LF (GAUZE/BANDAGES/DRESSINGS) ×2 IMPLANT
SPONGE NEURO XRAY DETECT 1X3 (DISPOSABLE) ×3 IMPLANT
SPONGE SURGIFOAM ABS GEL 12-7 (HEMOSTASIS) IMPLANT
SUT ETHILON 3 0 PS 1 (SUTURE) ×3 IMPLANT
SUT PLAIN 4 0 ~~LOC~~ 1 (SUTURE) ×3 IMPLANT
SUT SILK 3 0 PS 1 (SUTURE) IMPLANT
SWAB COLLECTION DEVICE MRSA (MISCELLANEOUS) ×2 IMPLANT
SYR 3ML 23GX1 SAFETY (SYRINGE) IMPLANT
TOWEL GREEN STERILE FF (TOWEL DISPOSABLE) ×6 IMPLANT
TRACKER ENT INSTRUMENT (MISCELLANEOUS) ×3 IMPLANT
TRACKER ENT PATIENT (MISCELLANEOUS) ×3 IMPLANT
TUBE CONNECTING 20'X1/4 (TUBING) ×1
TUBE CONNECTING 20X1/4 (TUBING) ×2 IMPLANT
TUBE SALEM SUMP 12R W/ARV (TUBING) IMPLANT
TUBE SALEM SUMP 16 FR W/ARV (TUBING) ×2 IMPLANT
TUBING STRAIGHTSHOT EPS 5PK (TUBING) ×3 IMPLANT
YANKAUER SUCT BULB TIP NO VENT (SUCTIONS) ×3 IMPLANT

## 2019-09-25 NOTE — Anesthesia Procedure Notes (Signed)
Procedure Name: Intubation Date/Time: 09/25/2019 9:48 AM Performed by: Lieutenant Diego, CRNA Pre-anesthesia Checklist: Patient identified, Emergency Drugs available, Suction available and Patient being monitored Patient Re-evaluated:Patient Re-evaluated prior to induction Oxygen Delivery Method: Circle system utilized Preoxygenation: Pre-oxygenation with 100% oxygen Induction Type: IV induction Ventilation: Mask ventilation without difficulty Laryngoscope Size: Miller and 2 Grade View: Grade II Tube type: Oral Tube size: 7.0 mm Number of attempts: 1 Airway Equipment and Method: Stylet and Oral airway Placement Confirmation: ETT inserted through vocal cords under direct vision,  positive ETCO2 and breath sounds checked- equal and bilateral Secured at: 23 cm Tube secured with: Tape Dental Injury: Teeth and Oropharynx as per pre-operative assessment

## 2019-09-25 NOTE — Transfer of Care (Signed)
Immediate Anesthesia Transfer of Care Note  Patient: David Cunningham  Procedure(s) Performed: NASAL SEPTOPLASTY WITH TURBINATE REDUCTION (Bilateral Nose) ENDOSCOPIC SINUS SURGERY WITH FUSION NAVIGATION (Bilateral Nose)  Patient Location: PACU  Anesthesia Type:General  Level of Consciousness: awake  Airway & Oxygen Therapy: Patient Spontanous Breathing and Patient connected to face mask oxygen  Post-op Assessment: Report given to RN and Post -op Vital signs reviewed and stable  Post vital signs: Reviewed and stable  Last Vitals:  Vitals Value Taken Time  BP 156/65 09/25/19 1202  Temp    Pulse 67 09/25/19 1204  Resp 8 09/25/19 1203  SpO2 100 % 09/25/19 1204  Vitals shown include unvalidated device data.  Last Pain:  Vitals:   09/25/19 0812  TempSrc: Oral  PainSc: 2       Patients Stated Pain Goal: 1 ([id number])  Complications: No apparent anesthesia complications

## 2019-09-25 NOTE — H&P (Signed)
David Cunningham is an 63 y.o. male.   Chief Complaint: Nasal Obstruction HPI: Chronic sinusitis and nasal obstruction  Past Medical History:  Diagnosis Date  . Allergy   . Arthritis    hand, neck  . Back pain 09/11/2016   patient denies back pain but has neck pain  . Bicuspid aortic valve   . DEGENERATIVE DISC DISEASE, CERVICAL SPINE   . Heart murmur    never has caused any problems  . History of chicken pox   . Hypertension   . Migraines    last one 2 wks ago- allergy related   . MITRAL VALVE PROLAPSE   . PSA, INCREASED   . TINNITUS, CHRONIC, BILATERAL     Past Surgical History:  Procedure Laterality Date  . CERVICAL SPINE SURGERY    . COLONOSCOPY  2012   [place] hx polyps  . CYST REMOVAL NECK     And Face  . POLYPECTOMY     Colon  . Tonsillectome    . WISDOM TOOTH EXTRACTION      Family History  Problem Relation Age of Onset  . Colon cancer Mother 50       Deceased  . Lung cancer Mother   . Liver cancer Mother   . Breast cancer Sister   . Lymphoma Father 72       Deceased  . Diabetes Father   . Cancer Other        Paternal Grandparents  . Cancer Other        Maternal Grandparents  . Emphysema Paternal Grandfather   . Emphysema Maternal Grandfather   . Cancer Paternal Aunt   . Cancer Maternal Aunt   . Esophageal cancer Neg Hx   . Stomach cancer Neg Hx    Social History:  reports that he has been smoking cigarettes. He has a 40.00 pack-year smoking history. He has never used smokeless tobacco. He reports current alcohol use of about 7.0 standard drinks of alcohol per week. He reports that he does not use drugs.  Allergies: No Known Allergies  Medications Prior to Admission  Medication Sig Dispense Refill  . cetirizine (ZYRTEC) 5 MG tablet Take 10 mg by mouth daily.     . cyclobenzaprine (FLEXERIL) 10 MG tablet Take 1 tablet (10 mg total) by mouth 3 (three) times daily as needed for muscle spasms. 30 tablet 0  . diclofenac sodium (VOLTAREN) 1 % GEL  Apply 2 g topically 4 (four) times daily. (Patient taking differently: Apply 2 g topically daily as needed (thumb pain). ) 100 g 0  . fluticasone (FLONASE) 50 MCG/ACT nasal spray USE 2 SPRAYS IN EACH NOSTRIL DAILY AS NEEDED FOR ALLERGIES OR RHINITIS (Patient taking differently: Place 2 sprays into both nostrils daily as needed for allergies. USE 2 SPRAYS IN EACH NOSTRIL DAILY AS NEEDED FOR ALLERGIES OR RHINITIS) 48 g 1  . losartan (COZAAR) 50 MG tablet Take 1 tablet (50 mg total) by mouth daily. 90 tablet 3  . montelukast (SINGULAIR) 10 MG tablet Take 1 tablet (10 mg total) by mouth at bedtime. 90 tablet 1  . sodium chloride (OCEAN) 0.65 % SOLN nasal spray Place 1 spray into both nostrils as needed for congestion.      Results for orders placed or performed during the hospital encounter of 09/24/19 (from the past 48 hour(s))  SARS CORONAVIRUS 2 (TAT 6-24 HRS) Nasopharyngeal Nasopharyngeal Swab     Status: None   Collection Time: 09/24/19  8:12 AM   Specimen: Nasopharyngeal  Swab  Result Value Ref Range   SARS Coronavirus 2 NEGATIVE NEGATIVE    Comment: (NOTE) SARS-CoV-2 target nucleic acids are NOT DETECTED. The SARS-CoV-2 RNA is generally detectable in upper and lower respiratory specimens during the acute phase of infection. Negative results do not preclude SARS-CoV-2 infection, do not rule out co-infections with other pathogens, and should not be used as the sole basis for treatment or other patient management decisions. Negative results must be combined with clinical observations, patient history, and epidemiological information. The expected result is Negative. Fact Sheet for Patients: SugarRoll.be Fact Sheet for Healthcare Providers: https://www.woods-mathews.com/ This test is not yet approved or cleared by the [place] FDA and  has been authorized for detection and/or diagnosis of SARS-CoV-2 by FDA under an Emergency Use Authorization  (EUA). This EUA will remain  in effect (meaning this test can be used) for the duration of the COVID-19 declaration under Section 56 4(b)(1) of the Act, 21 U.S.C. section 360bbb-3(b)(1), unless the authorization is terminated or revoked sooner. Performed at [facility], [place] [address] [place], [place] [address]    No results found.  Review of Systems  Constitutional: Negative.   HENT: Positive for congestion.   Respiratory: Negative.   Cardiovascular: Negative.     Blood pressure (!) 169/63, pulse 65, temperature 98.4 F (36.9 C), temperature source Oral, resp. rate 20, height 5\' 9"  (1.753 m), weight 72.2 kg, SpO2 100 %. Physical Exam  Constitutional: He appears well-developed and well-nourished.  HENT:  Deviated septum  Neck: Normal range of motion. Neck supple.  Cardiovascular: Normal rate.  Respiratory: Effort normal.     Assessment/Plan Adm for OP septoplasty, Left ESS and IT reduction  [name], MD 09/25/2019, 9:32 AM

## 2019-09-25 NOTE — Op Note (Signed)
Operative Note:  ENDOSCOPIC SINUS SURGERY WITH NAVIGATION    SEPTOPLASTY    INFERIOR TURBINATE REDUCTION  Patient: David Cunningham  Medical record number: [id number]  Date:09/25/2019  Pre-operative Indications: 1.  Chronic Sinusitis     2.  Severe nasal septal deviation     3.  Bilateral inferior turbinate hypertrophy  Postoperative Indications: Same  Surgical Procedure: 1.  Left endoscopic sinus surgery with intraoperative navigation (Fusion): Left total ethmoidectomy and left maxillary antrostomy with removal of diseased tissue    2.  Nasal septoplasty    3. Bilateral Inferior Turbinate Reduction  Anesthesia: GET  Surgeon: Delsa Bern, M.D.  Complications: None  EBL: 150 cc  Findings: Heavy polypoid mucosa and disease in the left ethmoid and maxillary sinus, heavy mucopurulent debris within the maxillary sinus which was sent for culture and sensitivity.  Severe left nasal septal deviation and bilateral inferior turbinate hypertrophy.  Nasa-pore nasal packing placed in the left common ethmoid cavity.  Bilateral Doyle nasal septal splints placed at the conclusion of the surgical procedure.   Brief History: The patient is a 63 y.o. male with a history of chronic sinusitis and turbinate hypertrophy. The patient has been on medical therapy to reduce nasal mucosal edema and infection including antibiotics, saline nasal spray and topical nasal steroids. Despite appropriate medical therapy the patient continues to have ongoing symptoms. Given the patient's history and findings, the above surgical procedures were recommended, risks and benefits were discussed in detail with the patient may understand and agree with our plan for surgery which is scheduled at [facility] under general anesthesia as an outpatient.  Surgical Procedure: The patient is brought to the operating room on 09/25/2019 and placed in supine position on the operating table. General endotracheal anesthesia was established  without difficulty. When the patient was adequately anesthetized, surgical timeout was performed with correct identification of the patient and the surgical procedure. The patient's nose was then injected with 6 cc of 1% lidocaine 1:100,000 dilution epinephrine which was injected in a submucosal fashion. The patient's nose was then packed with Afrin-soaked cottonoid pledgets were left in place for approximately 10 minutes to allow for  vasoconstriction and hemostasis.  The Xomed Fusion navigation headgear was applied in anatomic and surgical landmarks were identified and confirmed, navigation was used throughout the sinus component of the surgical procedure.  With the patient prepped draped and prepared for surgery, nasal nasal endoscopy was performed on bilaterally using a 0 degree endoscope.  The patient had near complete nasal airway obstruction from left septal deviation and mucopurulent discharge in the nasopharynx.  The right middle meatus and nasal passageway was clear of disease or polyps.  The middle turbinate was medialized and a straight microdebrider a 0 degree telescope was used to perform total ethmoidectomy along the inferior aspect of the ethmoid sinus removing significant polypoid disease.  Using a 45 degree telescope and a curved microdebrider dissection was then carried out along the roof of the ethmoid sinus with navigation, completing a total ethmoidectomy.  The lateral nasal wall was inspected, uncinate process was resected using a through-cutting forcep and the natural ostium of the maxillary sinus was enlarged in a posterior and inferior direction.  Within the maxillary sinus thick mucoid material and polypoid soft tissue was resected.  The patient had bleeding along the superior anterior ethmoid region and a monopolar suction cautery was used to cauterize the bleeding site.  He also had some bleeding along the posterior attachment of the left middle turbinate which  was also cauterized.   Nasa-pore nasal packing was then placed in the left common ethmoid cavity.   A right anterior hemitransfixion incision was created and a mucoperichondrial flap was elevated from anterior to posterior on the left-hand side. The anterior cartilaginous septum was crossed at the midline and a mucoperichondrial flap was elevated on the patient's right.  Swivel knife was then used to resect the anterior and mid cartilaginous portion of the nasal septum.  Resected cartilage was morcellized and returned to the mucoperichondrial pocket at the occlusion of the surgical procedure.  Dissection was then carried out from anterior to posterior removing deviated bone and cartilage including a large septal spur the overlying mucosa was preserved.  With the septum brought to good midline position, the morselized cartilage was returned to the mucoperichondrial pocket and the soft tissue/mucosal flaps were reapproximated with interrupted 4-0 gut suture on a Keith needle in a horizontal mattressing fashion.  Anterior hemitransfixion incision was closed with the same stitch.  Bilateral Doyle nasal septal splints were then placed after the application of Bactroban ointment and sutured in position with a 3-0 Ethilon suture.  Attention was then turned to the inferior turbinates, bilateral inferior turbinate intramural cautery was performed with cautery setting at 10 W.  2 submucosal passes were made in each inferior turbinate.  After completing cautery, anterior vertical incisions were created and overlying soft tissue was elevated, a small amount of turbinate bone was resected.  The turbinates were then outfractured to create a more patent nasal passageway.  Surgical sponge count was correct. An oral gastric tube was passed and the stomach contents were aspirated. Patient was awakened from anesthetic and transferred from the operating room to the recovery room in stable condition. There were no complications and blood loss was 150  cc.   Delsa Bern, M.D. [facility] 09/25/2019

## 2019-09-25 NOTE — Anesthesia Postprocedure Evaluation (Signed)
Anesthesia Post Note  Patient: Tharon Reining  Procedure(s) Performed: NASAL SEPTOPLASTY WITH TURBINATE REDUCTION (Bilateral Nose) ENDOSCOPIC SINUS SURGERY WITH FUSION NAVIGATION (Bilateral Nose)     Patient location during evaluation: PACU Anesthesia Type: General Level of consciousness: awake and alert, oriented and patient cooperative Pain management: pain level controlled Vital Signs Assessment: post-procedure vital signs reviewed and stable Respiratory status: spontaneous breathing, nonlabored ventilation and respiratory function stable Cardiovascular status: blood pressure returned to baseline and stable Postop Assessment: no apparent nausea or vomiting Anesthetic complications: no    Last Vitals:  Vitals:   09/25/19 1245 09/25/19 1255  BP: (!) 162/59 (!) 163/59  Pulse: 69 64  Resp: 14 16  Temp:  36.7 C  SpO2: 95% 97%    Last Pain:  Vitals:   09/25/19 1255  TempSrc:   PainSc: 0-No pain                 Jarome Matin Naksh Radi

## 2019-09-25 NOTE — Anesthesia Preprocedure Evaluation (Addendum)
Anesthesia Evaluation  Patient identified by MRN, date of birth, ID band Patient awake    Reviewed: Allergy & Precautions, NPO status , Patient's Chart, lab work & pertinent test results  Airway Mallampati: I  TM Distance: >3 FB Neck ROM: Full    Dental  (+) Teeth Intact, Dental Advisory Given   Pulmonary Current Smoker,  40 pack year history, 1ppd   Pulmonary exam normal breath sounds clear to auscultation       Cardiovascular hypertension, Pt. on medications Normal cardiovascular exam+ Valvular Problems/Murmurs MVP, AS and AI  Rhythm:Regular Rate:Normal  Bicuspid AV, MVP  Last echo 03/2019:   1. The left ventricle has normal systolic function with an ejection fraction of 60-65%. The cavity size was mildly dilated. There is mildly increased left ventricular wall thickness. Left ventricular diastolic parameters were normal.  2. The right ventricle has normal systolic function. The cavity was normal. There is no increase in right ventricular wall thickness.  3. The aortic valve is bicuspid. Moderate thickening of the aortic valve. Moderate calcification of the aortic valve. Aortic valve regurgitation is moderate by color flow Doppler. Moderate stenosis of the aortic valve.  4. There is mild dilatation of the aortic root measuring 38 mm.   HTN- poorly controlled   Neuro/Psych  Headaches, PSYCHIATRIC DISORDERS Anxiety    GI/Hepatic negative GI ROS, (+)     substance abuse  alcohol use,   Endo/Other  negative endocrine ROS  Renal/GU negative Renal ROS  negative genitourinary   Musculoskeletal  (+) Arthritis , Osteoarthritis,  Degenerative disc disease   Abdominal Normal abdominal exam  (+)   Peds negative pediatric ROS (+)  Hematology negative hematology ROS (+)   Anesthesia Other Findings Deviated septum, turbinate hypertrophy, chronic pansinusitis  Pain meds: diclofenac  Reproductive/Obstetrics negative OB  ROS                            Anesthesia Physical Anesthesia Plan  ASA: III  Anesthesia Plan: General   Post-op Pain Management:    Induction: Intravenous  PONV Risk Score and Plan: 1 and Ondansetron, Dexamethasone, Treatment may vary due to age or medical condition and Midazolam  Airway Management Planned: Oral ETT  Additional Equipment: None  Intra-op Plan:   Post-operative Plan: Extubation in OR  Informed Consent: I have reviewed the patients History and Physical, chart, labs and discussed the procedure including the risks, benefits and alternatives for the proposed anesthesia with the patient or authorized representative who has indicated his/her understanding and acceptance.     Dental advisory given  Plan Discussed with: CRNA  Anesthesia Plan Comments:         Anesthesia Quick Evaluation

## 2019-09-27 LAB — SURGICAL PATHOLOGY

## 2019-09-29 ENCOUNTER — Encounter (HOSPITAL_BASED_OUTPATIENT_CLINIC_OR_DEPARTMENT_OTHER): Payer: Self-pay | Admitting: Otolaryngology

## 2019-09-30 LAB — AEROBIC/ANAEROBIC CULTURE W GRAM STAIN (SURGICAL/DEEP WOUND): Culture: NORMAL

## 2019-10-06 ENCOUNTER — Encounter: Payer: Self-pay | Admitting: *Deleted

## 2019-10-13 ENCOUNTER — Ambulatory Visit (AMBULATORY_SURGERY_CENTER): Payer: Self-pay

## 2019-10-13 ENCOUNTER — Other Ambulatory Visit: Payer: Self-pay

## 2019-10-13 VITALS — Temp 96.2°F | Ht 69.0 in | Wt 162.6 lb

## 2019-10-13 DIAGNOSIS — Z800 Family history of malignant neoplasm of digestive organs: Secondary | ICD-10-CM

## 2019-10-13 DIAGNOSIS — Z860100 Personal history of colon polyps, unspecified: Secondary | ICD-10-CM

## 2019-10-13 MED ORDER — NA SULFATE-K SULFATE-MG SULF 17.5-3.13-1.6 GM/177ML PO SOLN: 1.0000 | Freq: Once | ORAL | 0 refills | Status: AC

## 2019-10-13 NOTE — Progress Notes (Signed)
Denies allergies to eggs or soy products. Denies complication of anesthesia or sedation. Denies use of weight loss medication. Denies use of O2.   Emmi instructions given for colonoscopy.  Covid screening is scheduled for 10/22/19 @ 11:30 Am. A 15.00 coupon for Suprep was given to the patient.

## 2019-10-14 ENCOUNTER — Encounter: Payer: Self-pay | Admitting: Internal Medicine

## 2019-10-22 ENCOUNTER — Ambulatory Visit (INDEPENDENT_AMBULATORY_CARE_PROVIDER_SITE_OTHER): Payer: BC Managed Care – PPO

## 2019-10-22 ENCOUNTER — Other Ambulatory Visit: Payer: Self-pay | Admitting: Internal Medicine

## 2019-10-22 DIAGNOSIS — Z1159 Encounter for screening for other viral diseases: Secondary | ICD-10-CM

## 2019-10-23 LAB — SARS CORONAVIRUS 2 (TAT 6-24 HRS): SARS Coronavirus 2: NEGATIVE

## 2019-10-27 ENCOUNTER — Other Ambulatory Visit: Payer: Self-pay

## 2019-10-27 ENCOUNTER — Ambulatory Visit (AMBULATORY_SURGERY_CENTER): Payer: BC Managed Care – PPO | Admitting: Internal Medicine

## 2019-10-27 ENCOUNTER — Encounter: Payer: Self-pay | Admitting: Internal Medicine

## 2019-10-27 VITALS — BP 116/44 | HR 53 | Temp 98.1°F | Resp 15 | Ht 69.0 in | Wt 162.6 lb

## 2019-10-27 DIAGNOSIS — Z1211 Encounter for screening for malignant neoplasm of colon: Secondary | ICD-10-CM | POA: Diagnosis not present

## 2019-10-27 DIAGNOSIS — D120 Benign neoplasm of cecum: Secondary | ICD-10-CM

## 2019-10-27 DIAGNOSIS — D125 Benign neoplasm of sigmoid colon: Secondary | ICD-10-CM | POA: Diagnosis not present

## 2019-10-27 DIAGNOSIS — D124 Benign neoplasm of descending colon: Secondary | ICD-10-CM

## 2019-10-27 DIAGNOSIS — Z86010 Personal history of colonic polyps: Secondary | ICD-10-CM

## 2019-10-27 DIAGNOSIS — Z800 Family history of malignant neoplasm of digestive organs: Secondary | ICD-10-CM

## 2019-10-27 MED ORDER — SODIUM CHLORIDE 0.9 % IV SOLN: 500.0000 mL | Freq: Once | INTRAVENOUS | Status: DC

## 2019-10-27 NOTE — Progress Notes (Signed)
To PACU, VSS. Report to RN.tb 

## 2019-10-27 NOTE — Progress Notes (Signed)
Called to room to assist during endoscopic procedure.  Patient ID and intended procedure confirmed with present staff. Received instructions for my participation in the procedure from the performing physician.  

## 2019-10-27 NOTE — Progress Notes (Signed)
No problems noted in the recovery room. maw 

## 2019-10-27 NOTE — Op Note (Signed)
[place] Patient Name: David Cunningham Procedure Date: 10/27/2019 10:59 AM MRN: [id number] Endoscopist: Jerene Bears , MD Age: 63 Referring MD:  Date of Birth: November 21, 1955 Gender: Male Account #: [id number] Procedure:                Colonoscopy Indications:              High risk colon cancer surveillance: Personal                            history of multiple (3 or more) adenomas, Family                            history of colon cancer in a first-degree relative,                            Last colonoscopy 3 years ago Medicines:                Monitored Anesthesia Care Procedure:                Pre-Anesthesia Assessment:                           - Prior to the procedure, a History and Physical                            was performed, and patient medications and                            allergies were reviewed. The patient's tolerance of                            previous anesthesia was also reviewed. The risks                            and benefits of the procedure and the sedation                            options and risks were discussed with the patient.                            All questions were answered, and informed consent                            was obtained. Prior Anticoagulants: The patient has                            taken no previous anticoagulant or antiplatelet                            agents. ASA Grade Assessment: II - A patient with                            mild systemic disease. After reviewing the risks  and benefits, the patient was deemed in                            satisfactory condition to undergo the procedure.                           After obtaining informed consent, the colonoscope                            was passed under direct vision. Throughout the                            procedure, the patient's blood pressure, pulse, and                            oxygen saturations were monitored  continuously. The                            Colonoscope was introduced through the anus and                            advanced to the cecum, identified by appendiceal                            orifice and ileocecal valve. The colonoscopy was                            performed without difficulty. The patient tolerated                            the procedure well. The quality of the bowel                            preparation was good. The ileocecal valve,                            appendiceal orifice, and rectum were photographed. Scope In: 11:02:14 AM Scope Out: 11:19:31 AM Scope Withdrawal Time: 0 hours 12 minutes 19 seconds  Total Procedure Duration: 0 hours 17 minutes 17 seconds  Findings:                 The digital rectal exam was normal.                           A 3 mm polyp was found in the cecum. The polyp was                            sessile. The polyp was removed with a cold snare.                            Resection and retrieval were complete.                           A 5 mm polyp was found in  the descending colon. The                            polyp was sessile. The polyp was removed with a                            cold snare. Resection and retrieval were complete.                           A 5 mm polyp was found in the sigmoid colon. The                            polyp was sessile. The polyp was removed with a                            cold snare. Resection and retrieval were complete.                           Multiple small and large-mouthed diverticula were                            found in the sigmoid colon, descending colon,                            ascending colon and cecum.                           Internal hemorrhoids were found during                            retroflexion. The hemorrhoids were small. Complications:            No immediate complications. Estimated Blood Loss:     Estimated blood loss was minimal. Impression:               -  One 3 mm polyp in the cecum, removed with a cold                            snare. Resected and retrieved.                           - One 5 mm polyp in the descending colon, removed                            with a cold snare. Resected and retrieved.                           - One 5 mm polyp in the sigmoid colon, removed with                            a cold snare. Resected and retrieved.                           - Diverticulosis in the sigmoid colon, in  the                            descending colon, in the ascending colon and in the                            cecum.                           - Internal hemorrhoids. Recommendation:           - Patient has a contact number available for                            emergencies. The signs and symptoms of potential                            delayed complications were discussed with the                            patient. Return to normal activities tomorrow.                            Written discharge instructions were provided to the                            patient.                           - Resume previous diet.                           - Continue present medications.                           - Await pathology results.                           - Repeat colonoscopy is recommended for                            surveillance. The colonoscopy date will be                            determined after pathology results from today's                            exam become available for review. Jerene Bears, MD 10/27/2019 11:22:36 AM This report has been signed electronically.

## 2019-10-27 NOTE — Progress Notes (Signed)
Pt. Reports no change in his medical or surgical history since his pre-visit 10/13/2019.

## 2019-10-27 NOTE — Patient Instructions (Signed)
YOU HAD AN ENDOSCOPIC PROCEDURE TODAY AT [facility]:   Refer to the procedure report that was given to you for any specific questions about what was found during the examination.  If the procedure report does not answer your questions, please call your gastroenterologist to clarify.  If you requested that your care partner not be given the details of your procedure findings, then the procedure report has been included in a sealed envelope for you to review at your convenience later.  YOU SHOULD EXPECT: Some feelings of bloating in the abdomen. Passage of more gas than usual.  Walking can help get rid of the air that was put into your GI tract during the procedure and reduce the bloating. If you had a lower endoscopy (such as a colonoscopy or flexible sigmoidoscopy) you may notice spotting of blood in your stool or on the toilet paper. If you underwent a bowel prep for your procedure, you may not have a normal bowel movement for a few days.  Please Note:  You might notice some irritation and congestion in your nose or some drainage.  This is from the oxygen used during your procedure.  There is no need for concern and it should clear up in a day or so.  SYMPTOMS TO REPORT IMMEDIATELY:   Following lower endoscopy (colonoscopy or flexible sigmoidoscopy):  Excessive amounts of blood in the stool  Significant tenderness or worsening of abdominal pains  Swelling of the abdomen that is new, acute  Fever of 100F or higher    For urgent or emergent issues, a gastroenterologist can be reached at any hour by calling [phone].   DIET:  We do recommend a small meal at first, but then you may proceed to your regular diet.  Drink plenty of fluids but you should avoid alcoholic beverages for 24 hours.  ACTIVITY:  You should plan to take it easy for the rest of today and you should NOT DRIVE or use heavy machinery until tomorrow (because of the sedation medicines used during the test).     FOLLOW UP: Our staff will call the number listed on your records 48-72 hours following your procedure to check on you and address any questions or concerns that you may have regarding the information given to you following your procedure. If we do not reach you, we will leave a message.  We will attempt to reach you two times.  During this call, we will ask if you have developed any symptoms of COVID 19. If you develop any symptoms (ie: fever, flu-like symptoms, shortness of breath, cough etc.) before then, please call [phone].  If you test positive for Covid 19 in the 2 weeks post procedure, please call and report this information to [place].    If any biopsies were taken you will be contacted by phone or by letter within the next 1-3 weeks.  Please call us at [phone] if you have not heard about the biopsies in 3 weeks.    SIGNATURES/CONFIDENTIALITY: You and/or your care partner have signed paperwork which will be entered into your electronic medical record.  These signatures attest to the fact that that the information above on your After Visit Summary has been reviewed and is understood.  Full responsibility of the confidentiality of this discharge information lies with you and/or your care-partner.    Handouts were given to your care partner on polyps, diverticulosis, and hemorrhoids. You may resume your current medications today. Await biopsy results. Please  call if any questions or concerns.

## 2019-10-29 ENCOUNTER — Telehealth: Payer: Self-pay | Admitting: *Deleted

## 2019-10-29 ENCOUNTER — Encounter: Payer: Self-pay | Admitting: Internal Medicine

## 2019-10-29 NOTE — Telephone Encounter (Signed)
  Follow up Call-  Call back number 10/27/2019  Post procedure Call Back phone  # [phone]  Permission to leave phone message Yes  Some recent data might be hidden     Patient questions:  Message left to call us if necessary.

## 2019-10-29 NOTE — Telephone Encounter (Signed)
  Follow up Call-  Call back number 10/27/2019  Post procedure Call Back phone  # [phone]  Permission to leave phone message Yes  Some recent data might be hidden     Patient questions:  Do you have a fever, pain , or abdominal swelling? No. Pain Score  0 *  Have you tolerated food without any problems? Yes.    Have you been able to return to your normal activities? Yes.    Do you have any questions about your discharge instructions: Diet   No. Medications  No. Follow up visit  No.  Do you have questions or concerns about your Care? No.  Actions: * If pain score is 4 or above: No action needed, pain <4.  1. Have you developed a fever since your procedure? no  2.   Have you had an respiratory symptoms (SOB or cough) since your procedure? no  3.   Have you tested positive for COVID 19 since your procedure no  4.   Have you had any family members/close contacts diagnosed with the COVID 19 since your procedure?  no   If yes to any of these questions please route to Joylene John, RN and Alphonsa Gin, Therapist, sports.

## 2019-12-08 HISTORY — PX: CERVICAL DISC SURGERY: SHX588

## 2019-12-09 ENCOUNTER — Encounter: Payer: Self-pay | Admitting: Physician Assistant

## 2019-12-10 NOTE — Telephone Encounter (Signed)
LM for pt to call the office to schedule. ELEA

## 2019-12-10 NOTE — Telephone Encounter (Signed)
Please help schedule patient. Thank you.

## 2020-01-19 ENCOUNTER — Ambulatory Visit (INDEPENDENT_AMBULATORY_CARE_PROVIDER_SITE_OTHER): Payer: 59 | Admitting: Physician Assistant

## 2020-01-19 ENCOUNTER — Encounter: Payer: Self-pay | Admitting: Physician Assistant

## 2020-01-19 ENCOUNTER — Other Ambulatory Visit: Payer: Self-pay

## 2020-01-19 VITALS — BP 130/70 | HR 68 | Temp 97.8°F | Resp 16 | Ht 69.0 in | Wt 163.0 lb

## 2020-01-19 DIAGNOSIS — Z0000 Encounter for general adult medical examination without abnormal findings: Secondary | ICD-10-CM

## 2020-01-19 DIAGNOSIS — I10 Essential (primary) hypertension: Secondary | ICD-10-CM

## 2020-01-19 DIAGNOSIS — R972 Elevated prostate specific antigen [PSA]: Secondary | ICD-10-CM | POA: Diagnosis not present

## 2020-01-19 DIAGNOSIS — J310 Chronic rhinitis: Secondary | ICD-10-CM

## 2020-01-19 LAB — COMPREHENSIVE METABOLIC PANEL
ALT: 15 U/L (ref 0–53)
AST: 20 U/L (ref 0–37)
Albumin: 4.4 g/dL (ref 3.5–5.2)
Alkaline Phosphatase: 64 U/L (ref 39–117)
BUN: 19 mg/dL (ref 6–23)
CO2: 27 mEq/L (ref 19–32)
Calcium: 9.4 mg/dL (ref 8.4–10.5)
Chloride: 103 mEq/L (ref 96–112)
Creatinine, Ser: 0.92 mg/dL (ref 0.40–1.50)
GFR: 83.43 mL/min (ref >60.00)
Glucose, Bld: 93 mg/dL (ref 70–99)
Potassium: 5 mEq/L (ref 3.5–5.1)
Sodium: 140 mEq/L (ref 135–145)
Total Bilirubin: 0.5 mg/dL (ref 0.2–1.2)
Total Protein: 7 g/dL (ref 6.0–8.3)

## 2020-01-19 LAB — CBC WITH DIFFERENTIAL/PLATELET
Basophils Absolute: 0.1 10*3/uL (ref 0.0–0.1)
Basophils Relative: 1.2 % (ref 0.0–3.0)
Eosinophils Absolute: 0.1 10*3/uL (ref 0.0–0.7)
Eosinophils Relative: 1.1 % (ref 0.0–5.0)
HCT: 47.6 % (ref 39.0–52.0)
Hemoglobin: 16.5 g/dL (ref 13.0–17.0)
Lymphocytes Relative: 26.7 % (ref 12.0–46.0)
Lymphs Abs: 2 10*3/uL (ref 0.7–4.0)
MCHC: 34.6 g/dL (ref 30.0–36.0)
MCV: 98 fl (ref 78.0–100.0)
Monocytes Absolute: 0.6 10*3/uL (ref 0.1–1.0)
Monocytes Relative: 7.5 % (ref 3.0–12.0)
Neutro Abs: 4.9 10*3/uL (ref 1.4–7.7)
Neutrophils Relative %: 63.5 % (ref 43.0–77.0)
Platelets: 210 10*3/uL (ref 150.0–400.0)
RBC: 4.86 Mil/uL (ref 4.22–5.81)
RDW: 13.7 % (ref 11.5–15.5)
WBC: 7.6 10*3/uL (ref 4.0–10.5)

## 2020-01-19 LAB — LIPID PANEL
Cholesterol: 165 mg/dL (ref 0–200)
HDL: 85.6 mg/dL (ref >39.00)
LDL Cholesterol: 72 mg/dL (ref 0–99)
NonHDL: 79.83
Total CHOL/HDL Ratio: 2
Triglycerides: 38 mg/dL (ref 0.0–149.0)
VLDL: 7.6 mg/dL (ref 0.0–40.0)

## 2020-01-19 LAB — HEMOGLOBIN A1C: Hgb A1c MFr Bld: 5.4 % (ref 4.6–6.5)

## 2020-01-19 LAB — PSA: PSA: 5.32 ng/mL — ABNORMAL HIGH (ref 0.10–4.00)

## 2020-01-19 NOTE — Progress Notes (Signed)
Patient presents to clinic today for annual exam.  Patient is fasting for labs. Is scheduled for first COVID vaccine later today.  Is followed by Urology for elevated PSA -- underwent prostate biopsy last fall. Benign. .   Chronic Issues: Hypertension -- Currently on a regimen of losartan 50 mg once daily. Followed by Cardiology due to history of mitral valve disorder/bicuspid aortic valve. Endorses taking medication as directed. Patient denies chest pain, palpitations, lightheadedness, dizziness, vision changes or frequent headaches.  BP Readings from Last 3 Encounters:  01/19/20 130/70  10/27/19 (!) 116/44  09/25/19 (!) 163/59   Chronic Rhinitis -- Followed by ENT s/o sinus surgery last years. No infection since. Takes his OTC antihistamine and singulair daily.  Is wondering if he still needs all of these medications.  Health Maintenance: Immunizations -- Scheduled for COVID vaccine today. Declines flu. TDaP up-to-date. Colonoscopy -- UTD  Past Medical History:  Diagnosis Date  . Allergy   . Arthritis    hand, neck  . Back pain 09/11/2016   patient denies back pain but has neck pain  . Bicuspid aortic valve   . DEGENERATIVE DISC DISEASE, CERVICAL SPINE   . Heart murmur    never has caused any problems  . History of chicken pox   . Hypertension   . Migraines    last one 2 wks ago- allergy related   . MITRAL VALVE PROLAPSE   . PSA, INCREASED   . TINNITUS, CHRONIC, BILATERAL     Past Surgical History:  Procedure Laterality Date  . CERVICAL SPINE SURGERY    . COLONOSCOPY  2012   [place] hx polyps  . CYST REMOVAL NECK     And Face  . NASAL SEPTOPLASTY W/ TURBINOPLASTY Bilateral 09/25/2019   Procedure: NASAL SEPTOPLASTY WITH TURBINATE REDUCTION;  Surgeon: [name], MD;  Location: [place];  Service: ENT;  Laterality: Bilateral;  . POLYPECTOMY     Colon  . SINUS ENDO WITH FUSION Bilateral 09/25/2019   Procedure: ENDOSCOPIC SINUS SURGERY  WITH FUSION NAVIGATION;  Surgeon: [name], MD;  Location: [place];  Service: ENT;  Laterality: Bilateral;  . Tonsillectome    . WISDOM TOOTH EXTRACTION      Current Outpatient Medications on File Prior to Visit  Medication Sig Dispense Refill  . cetirizine (ZYRTEC) 5 MG tablet Take 10 mg by mouth daily.     . diclofenac sodium (VOLTAREN) 1 % GEL Apply 2 g topically 4 (four) times daily. 100 g 0  . fluticasone (FLONASE) 50 MCG/ACT nasal spray Place into both nostrils daily.    Marland Kitchen losartan (COZAAR) 50 MG tablet Take 1 tablet (50 mg total) by mouth daily. 90 tablet 3  . montelukast (SINGULAIR) 10 MG tablet Take 1 tablet (10 mg total) by mouth at bedtime. 90 tablet 1  . sodium chloride (OCEAN) 0.65 % SOLN nasal spray Place 1 spray into both nostrils as needed for congestion.     No current facility-administered medications on file prior to visit.    No Known Allergies  Family History  Problem Relation Age of Onset  . Colon cancer Mother 7       Deceased  . Lung cancer Mother   . Liver cancer Mother   . Breast cancer Sister   . Lymphoma Father 44       Deceased  . Diabetes Father   . Cancer Other        Paternal Grandparents  . Cancer Other  Maternal Grandparents  . Emphysema Paternal Grandfather   . Emphysema Maternal Grandfather   . Cancer Paternal Aunt   . Cancer Maternal Aunt   . Esophageal cancer Neg Hx   . Stomach cancer Neg Hx   . Rectal cancer Neg Hx     Social History   Socioeconomic History  . Marital status: Married    Spouse name: Not on file  . Number of children: Not on file  . Years of education: Not on file  . Highest education level: Not on file  Occupational History  . Occupation: Retired  Tobacco Use  . Smoking status: Current Every Day Smoker    Packs/day: 1.00    Years: 40.00    Pack years: 40.00    Types: Cigarettes  . Smokeless tobacco: Never Used  Substance and Sexual Activity  . Alcohol use: Yes     Alcohol/week: 7.0 standard drinks    Types: 7 Standard drinks or equivalent per week    Comment: 1 glass of scotch daily  . Drug use: No  . Sexual activity: Yes    Partners: Female    Comment: wife  Other Topics Concern  . Not on file  Social History Narrative  . Not on file   Social Determinants of Health   Financial Resource Strain:   . Difficulty of Paying Living Expenses:   Food Insecurity:   . Worried About Charity fundraiser in the Last Year:   . Arboriculturist in the Last Year:   Transportation Needs:   . Film/video editor (Medical):   Marland Kitchen Lack of Transportation (Non-Medical):   Physical Activity:   . Days of Exercise per Week:   . Minutes of Exercise per Session:   Stress:   . Feeling of Stress :   Social Connections:   . Frequency of Communication with Friends and Family:   . Frequency of Social Gatherings with Friends and Family:   . Attends Religious Services:   . Active Member of Clubs or Organizations:   . Attends Archivist Meetings:   Marland Kitchen Marital Status:   Intimate Partner Violence:   . Fear of Current or Ex-Partner:   . Emotionally Abused:   Marland Kitchen Physically Abused:   . Sexually Abused:    Review of Systems  Constitutional: Negative for fever and weight loss.  HENT: Negative for ear discharge, ear pain, hearing loss and tinnitus.   Eyes: Negative for blurred vision, double vision, photophobia and pain.  Respiratory: Negative for cough and shortness of breath.   Cardiovascular: Negative for chest pain and palpitations.  Gastrointestinal: Negative for abdominal pain, blood in stool, constipation, diarrhea, heartburn, melena, nausea and vomiting.  Genitourinary: Negative for dysuria, flank pain, frequency, hematuria and urgency.  Musculoskeletal: Negative for falls.  Neurological: Negative for dizziness, loss of consciousness and headaches.  Endo/Heme/Allergies: Negative for environmental allergies.  Psychiatric/Behavioral: Negative for  depression, hallucinations, substance abuse and suicidal ideas. The patient is not nervous/anxious and does not have insomnia.     Resp 16   Ht 5\' 9"  (1.753 m)   Wt 163 lb (73.9 kg)   BMI 24.07 kg/m   Physical Exam Vitals reviewed.  Constitutional:      General: He is not in acute distress.    Appearance: He is well-developed. He is not diaphoretic.  HENT:     Head: Normocephalic and atraumatic.     Right Ear: Tympanic membrane, ear canal and external ear normal.  Left Ear: Tympanic membrane, ear canal and external ear normal.     Nose: Nose normal.     Mouth/Throat:     Pharynx: No posterior oropharyngeal erythema.  Eyes:     Conjunctiva/sclera: Conjunctivae normal.     Pupils: Pupils are equal, round, and reactive to light.  Neck:     Thyroid: No thyromegaly.  Cardiovascular:     Rate and Rhythm: Normal rate and regular rhythm.     Heart sounds: Normal heart sounds.  Pulmonary:     Effort: Pulmonary effort is normal. No respiratory distress.     Breath sounds: Normal breath sounds. No wheezing or rales.  Chest:     Chest wall: No tenderness.  Abdominal:     General: Bowel sounds are normal. There is no distension.     Palpations: Abdomen is soft. There is no mass.     Tenderness: There is no abdominal tenderness. There is no guarding or rebound.  Musculoskeletal:     Cervical back: Neck supple.  Lymphadenopathy:     Cervical: No cervical adenopathy.  Skin:    General: Skin is warm and dry.     Findings: No rash.  Neurological:     Mental Status: He is alert and oriented to person, place, and time.     Cranial Nerves: No cranial nerve deficit.     Recent Results (from the past 2160 hour(s))  SARS Coronavirus 2 (TAT 6-24 hrs)     Status: None   Collection Time: 10/22/19 12:00 AM  Result Value Ref Range   SARS Coronavirus 2 RESULT:  NEGATIVE     Comment: RESULT:  NEGATIVESARS-CoV-2 INTERPRETATION:A NEGATIVE  test result means that SARS-CoV-2 RNA was not  present in the specimen above the limit of detection of this test. This does not preclude a possible SARS-CoV-2 infection and should not be used as the  sole basis for patient management decisions. Negative results must be combined with clinical observations, patient history, and epidemiological information. Optimum specimen types and timing for peak viral levels during infections caused by SARS-CoV-2  have not been determined. Collection of multiple specimens or types of specimens may be necessary to detect virus. Improper specimen collection and handling, sequence variability under primers/probes, or organism present below the limit of detection may  lead to false negative results. Positive and negative predictive values of testing are highly dependent on prevalence. False negative test results are more likely when prevalence of disease is high.The expected result is NEGATIVE.Fact  Sheet for  Healthcare Providers: https://www.woods-mathews.com/.Fact Sheet for Patients: SugarRoll.be.Normal Reference Range - Negative     Assessment/Plan: 1. Visit for preventive health examination Depression screen negative. Health Maintenance reviewed. Preventive schedule discussed and handout given in AVS. Will obtain fasting labs today.  - CBC with Differential/Platelet - Comprehensive metabolic panel - Hemoglobin A1c - Lipid panel  2. Essential hypertension BP stable.  Asymptomatic.  Continue current regimen.  Repeat labs today. - Comprehensive metabolic panel - Hemoglobin A1c - Lipid panel  3. PSA, INCREASED Followed by urology.  Has upcoming appointment later this week.  Would like [place] to draw PSA so he has results in time to discuss with urologist.  PSA ordered. - PSA  4. Chronic rhinitis Much improved after sinus surgery.  Discussed he can cut back his Singulair to every other day dosing to see if he notes any worsening of symptoms.  If remaining stable can  completely wean off of this.  Can follow-up at that time to determine  removing other agents.  This visit occurred during the SARS-CoV-2 public health emergency.  Safety protocols were in place, including screening questions prior to the visit, additional usage of staff PPE, and extensive cleaning of exam room while observing appropriate contact time as indicated for disinfecting solutions.    [name], PA-C

## 2020-01-19 NOTE — Patient Instructions (Signed)
Please go to the lab for blood work.   Our office will call you with your results unless you have chosen to receive results via MyChart.  If your blood work is normal we will follow-up each year for physicals and as scheduled for chronic medical problems.  If anything is abnormal we will treat accordingly and get you in for a follow-up.  Try to reduce Singulair to every other day dosing for the next week.  If still doing well can try to come off of it, continuing your daily antihistamine and Flonase.    Preventive Care 46-94 Years Old, Male Preventive care refers to lifestyle choices and visits with your health care provider that can promote health and wellness. This includes:  A yearly physical exam. This is also called an annual well check.  Regular dental and eye exams.  Immunizations.  Screening for certain conditions.  Healthy lifestyle choices, such as eating a healthy diet, getting regular exercise, not using drugs or products that contain nicotine and tobacco, and limiting alcohol use. What can I expect for my preventive care visit? Physical exam Your health care provider will check:  Height and weight. These may be used to calculate body mass index (BMI), which is a measurement that tells if you are at a healthy weight.  Heart rate and blood pressure.  Your skin for abnormal spots. Counseling Your health care provider may ask you questions about:  Alcohol, tobacco, and drug use.  Emotional well-being.  Home and relationship well-being.  Sexual activity.  Eating habits.  Work and work Statistician. What immunizations do I need?  Influenza (flu) vaccine  This is recommended every year. Tetanus, diphtheria, and pertussis (Tdap) vaccine  You may need a Td booster every 10 years. Varicella (chickenpox) vaccine  You may need this vaccine if you have not already been vaccinated. Zoster (shingles) vaccine  You may need this after age 17. Measles, mumps,  and rubella (MMR) vaccine  You may need at least one dose of MMR if you were born in 1957 or later. You may also need a second dose. Pneumococcal conjugate (PCV13) vaccine  You may need this if you have certain conditions and were not previously vaccinated. Pneumococcal polysaccharide (PPSV23) vaccine  You may need one or two doses if you smoke cigarettes or if you have certain conditions. Meningococcal conjugate (MenACWY) vaccine  You may need this if you have certain conditions. Hepatitis A vaccine  You may need this if you have certain conditions or if you travel or work in places where you may be exposed to hepatitis A. Hepatitis B vaccine  You may need this if you have certain conditions or if you travel or work in places where you may be exposed to hepatitis B. Haemophilus influenzae type b (Hib) vaccine  You may need this if you have certain risk factors. Human papillomavirus (HPV) vaccine  If recommended by your health care provider, you may need three doses over 6 months. You may receive vaccines as individual doses or as more than one vaccine together in one shot (combination vaccines). Talk with your health care provider about the risks and benefits of combination vaccines. What tests do I need? Blood tests  Lipid and cholesterol levels. These may be checked every 5 years, or more frequently if you are over 17 years old.  Hepatitis C test.  Hepatitis B test. Screening  Lung cancer screening. You may have this screening every year starting at age 29 if you have a  30-pack-year history of smoking and currently smoke or have quit within the past 15 years.  Prostate cancer screening. Recommendations will vary depending on your family history and other risks.  Colorectal cancer screening. All adults should have this screening starting at age 73 and continuing until age 85. Your health care provider may recommend screening at age 46 if you are at increased risk. You will  have tests every 1-10 years, depending on your results and the type of screening test.  Diabetes screening. This is done by checking your blood sugar (glucose) after you have not eaten for a while (fasting). You may have this done every 1-3 years.  Sexually transmitted disease (STD) testing. Follow these instructions at home: Eating and drinking  Eat a diet that includes fresh fruits and vegetables, whole grains, lean protein, and low-fat dairy products.  Take vitamin and mineral supplements as recommended by your health care provider.  Do not drink alcohol if your health care provider tells you not to drink.  If you drink alcohol: ? Limit how much you have to 0-2 drinks a day. ? Be aware of how much alcohol is in your drink. In the [place] one drink equals one 12 oz bottle of beer (355 mL), one 5 oz glass of wine (148 mL), or one 1 oz glass of hard liquor (44 mL). Lifestyle  Take daily care of your teeth and gums.  Stay active. Exercise for at least 30 minutes on 5 or more days each week.  Do not use any products that contain nicotine or tobacco, such as cigarettes, e-cigarettes, and chewing tobacco. If you need help quitting, ask your health care provider.  If you are sexually active, practice safe sex. Use a condom or other form of protection to prevent STIs (sexually transmitted infections).  Talk with your health care provider about taking a low-dose aspirin every day starting at age 35. What's next?  Go to your health care provider once a year for a well check visit.  Ask your health care provider how often you should have your eyes and teeth checked.  Stay up to date on all vaccines. This information is not intended to replace advice given to you by your health care provider. Make sure you discuss any questions you have with your health care provider. Document Revised: 10/17/2018 Document Reviewed: 10/17/2018 Elsevier Patient Education  [employer]. .

## 2020-02-09 ENCOUNTER — Encounter: Payer: Self-pay | Admitting: Physician Assistant

## 2020-03-02 ENCOUNTER — Encounter: Payer: Self-pay | Admitting: Physician Assistant

## 2020-03-02 ENCOUNTER — Other Ambulatory Visit: Payer: Self-pay | Admitting: Physician Assistant

## 2020-03-02 MED ORDER — DICLOFENAC SODIUM 1 % EX GEL: 2.0000 g | Freq: Four times a day (QID) | CUTANEOUS | 1 refills | Status: AC

## 2020-03-04 ENCOUNTER — Encounter: Payer: Self-pay | Admitting: Physician Assistant

## 2020-03-04 ENCOUNTER — Other Ambulatory Visit: Payer: Self-pay

## 2020-03-04 DIAGNOSIS — J302 Other seasonal allergic rhinitis: Secondary | ICD-10-CM

## 2020-03-04 MED ORDER — MONTELUKAST SODIUM 10 MG PO TABS: 10.0000 mg | ORAL_TABLET | Freq: Every day | ORAL | 1 refills | Status: DC

## 2020-03-12 ENCOUNTER — Encounter: Payer: Self-pay | Admitting: Physician Assistant

## 2020-03-12 ENCOUNTER — Other Ambulatory Visit: Payer: Self-pay

## 2020-03-12 ENCOUNTER — Telehealth (INDEPENDENT_AMBULATORY_CARE_PROVIDER_SITE_OTHER): Payer: 59 | Admitting: Physician Assistant

## 2020-03-12 DIAGNOSIS — B9689 Other specified bacterial agents as the cause of diseases classified elsewhere: Secondary | ICD-10-CM | POA: Diagnosis not present

## 2020-03-12 DIAGNOSIS — J019 Acute sinusitis, unspecified: Secondary | ICD-10-CM | POA: Diagnosis not present

## 2020-03-12 MED ORDER — AMOXICILLIN-POT CLAVULANATE 875-125 MG PO TABS: 1.0000 | ORAL_TABLET | Freq: Two times a day (BID) | ORAL | 0 refills | Status: DC

## 2020-03-12 NOTE — Progress Notes (Signed)
Virtual Visit via Video   I connected with patient on 03/12/20 at  8:30 AM EDT by a video enabled telemedicine application and verified that I am speaking with the correct person using two identifiers.  Location patient: Home Location provider: [facility], Office Persons participating in the virtual visit: Patient, Provider, [place] (Patina Moore)  I discussed the limitations of evaluation and management by telemedicine and the availability of in person appointments. The patient expressed understanding and agreed to proceed.  Subjective:   HPI:   Patient presents via [facility] today complaining of 4 days of sinus symptoms.  Notes symptoms started out very mild is nasal congestion with some sinus pressure and postnasal drainage.  Initially noting some improvement in symptoms but over the past 24 to 36 hours has noted a significant worsening of symptoms.  Now with left-sided maxillary and frontal sinus pain and tooth pain.  Denies fever or chills.  Notes some cough secondary to drainage but denies chest congestion or shortness of breath.  Has been taking his allergy medicines as directed.  Denies sick contact.  Patient is fully vaccinated against Covid.  Denies any loss of taste or smell.  Denies GI symptoms.  ROS:   See pertinent positives and negatives per HPI.  Patient Active Problem List   Diagnosis Date Noted  . Cervical spondylosis with radiculopathy 06/16/2019  . Chronic pansinusitis 04/21/2019  . Nasal turbinate hypertrophy 03/19/2019  . Nasal septal deviation 03/19/2019  . Encounter for screening for lung cancer 01/14/2018  . Right-sided thoracic back pain 09/11/2016  . Situational anxiety 09/04/2016  . Warts of foot 10/25/2015  . Visit for preventive health examination 10/06/2014  . Prostate cancer screening 10/06/2014  . Bicuspid aortic valve 09/24/2012  . Hypertension 09/24/2012  . DEGENERATIVE DISC DISEASE, CERVICAL SPINE 02/18/2010  . TOBACCO ABUSE 07/08/2009   . TINNITUS, CHRONIC, BILATERAL 07/08/2009  . Mitral valve disorder 07/08/2009  . Chronic rhinitis 07/08/2009  . PSA, INCREASED 07/08/2009    Social History   Tobacco Use  . Smoking status: Current Every Day Smoker    Packs/day: 1.00    Years: 40.00    Pack years: 40.00    Types: Cigarettes  . Smokeless tobacco: Never Used  Substance Use Topics  . Alcohol use: Yes    Alcohol/week: 7.0 standard drinks    Types: 7 Standard drinks or equivalent per week    Comment: 1 glass of scotch daily    Current Outpatient Medications:  .  cetirizine (ZYRTEC) 5 MG tablet, Take 10 mg by mouth daily. , Disp: , Rfl:  .  diclofenac Sodium (VOLTAREN) 1 % GEL, Apply 2 g topically 4 (four) times daily., Disp: 100 g, Rfl: 1 .  fluticasone (FLONASE) 50 MCG/ACT nasal spray, Place into both nostrils daily., Disp: , Rfl:  .  losartan (COZAAR) 50 MG tablet, Take 1 tablet (50 mg total) by mouth daily., Disp: 90 tablet, Rfl: 3 .  montelukast (SINGULAIR) 10 MG tablet, Take 1 tablet (10 mg total) by mouth at bedtime., Disp: 90 tablet, Rfl: 1 .  sodium chloride (OCEAN) 0.65 % SOLN nasal spray, Place 1 spray into both nostrils as needed for congestion., Disp: , Rfl:   No Known Allergies  Objective:   There were no vitals taken for this visit.  Patient is well-developed, well-nourished in no acute distress.  Resting comfortably at home.  Head is normocephalic, atraumatic.  No labored breathing.  Speech is clear and coherent with logical content.  Patient is alert and  oriented at baseline.  Positive TTP sinuses  Assessment and Plan:   1. Acute bacterial sinusitis Rx Augmentin.  Increase fluids.  Rest.  Saline nasal spray.  Probiotic.  Mucinex as directed.  Humidifier in bedroom.  Continue allergy regimen.  Call or return to clinic if symptoms are not improving.  [name], PA-C 03/12/2020

## 2020-03-12 NOTE — Patient Instructions (Signed)
Instructions sent to MyChart

## 2020-03-12 NOTE — Progress Notes (Signed)
I have discussed the procedure for the virtual visit with the patient who has given consent to proceed with assessment and treatment.  Patient is unable to obtain vital signs.  Fritz Pickerel, LPN

## 2020-03-31 ENCOUNTER — Telehealth (INDEPENDENT_AMBULATORY_CARE_PROVIDER_SITE_OTHER): Payer: No Typology Code available for payment source | Admitting: Physician Assistant

## 2020-03-31 ENCOUNTER — Encounter: Payer: Self-pay | Admitting: Physician Assistant

## 2020-03-31 ENCOUNTER — Other Ambulatory Visit: Payer: Self-pay

## 2020-03-31 DIAGNOSIS — J329 Chronic sinusitis, unspecified: Secondary | ICD-10-CM | POA: Diagnosis not present

## 2020-03-31 MED ORDER — DOXYCYCLINE HYCLATE 100 MG PO CAPS: 100.0000 mg | ORAL_CAPSULE | Freq: Two times a day (BID) | ORAL | 0 refills | Status: DC

## 2020-03-31 NOTE — Progress Notes (Signed)
Virtual Visit via Video   I connected with patient on 03/31/20 at  1:30 PM EDT by a video enabled telemedicine application and verified that I am speaking with the correct person using two identifiers.  Location patient: Home Location provider: [facility], Office Persons participating in the virtual visit: Patient, Provider, [place] (Patina Moore)  I discussed the limitations of evaluation and management by telemedicine and the availability of in person appointments. The patient expressed understanding and agreed to proceed.  Subjective:   HPI:   Patient presents via [place] today c/o recurrence of sinus symptoms including facial pain, left-sided maxillary pain and tooth pain. Denies fever, chills, aches. Was treated for bacterial sinusitis on 03/12/2020 and was given a course of Augmentin. Took only 5.5 days of this as it caused stomach issue and he had to go out of town. Notes fatigue. Has continued with his Zyrtec and Singulair daily. Denies sick contact.   ROS:   See pertinent positives and negatives per HPI.  Patient Active Problem List   Diagnosis Date Noted  . Cervical spondylosis with radiculopathy 06/16/2019  . Chronic pansinusitis 04/21/2019  . Nasal turbinate hypertrophy 03/19/2019  . Nasal septal deviation 03/19/2019  . Encounter for screening for lung cancer 01/14/2018  . Right-sided thoracic back pain 09/11/2016  . Situational anxiety 09/04/2016  . Warts of foot 10/25/2015  . Visit for preventive health examination 10/06/2014  . Prostate cancer screening 10/06/2014  . Bicuspid aortic valve 09/24/2012  . Hypertension 09/24/2012  . DEGENERATIVE DISC DISEASE, CERVICAL SPINE 02/18/2010  . TOBACCO ABUSE 07/08/2009  . TINNITUS, CHRONIC, BILATERAL 07/08/2009  . Mitral valve disorder 07/08/2009  . Chronic rhinitis 07/08/2009  . PSA, INCREASED 07/08/2009    Social History   Tobacco Use  . Smoking status: Current Every Day Smoker    Packs/day: 1.00   Years: 40.00    Pack years: 40.00    Types: Cigarettes  . Smokeless tobacco: Never Used  Substance Use Topics  . Alcohol use: Yes    Alcohol/week: 7.0 standard drinks    Types: 7 Standard drinks or equivalent per week    Comment: 1 glass of scotch daily    Current Outpatient Medications:  .  cetirizine (ZYRTEC) 5 MG tablet, Take 10 mg by mouth daily. , Disp: , Rfl:  .  diclofenac Sodium (VOLTAREN) 1 % GEL, Apply 2 g topically 4 (four) times daily., Disp: 100 g, Rfl: 1 .  fluticasone (FLONASE) 50 MCG/ACT nasal spray, Place into both nostrils daily., Disp: , Rfl:  .  losartan (COZAAR) 50 MG tablet, Take 1 tablet (50 mg total) by mouth daily., Disp: 90 tablet, Rfl: 3 .  montelukast (SINGULAIR) 10 MG tablet, Take 1 tablet (10 mg total) by mouth at bedtime., Disp: 90 tablet, Rfl: 1 .  sodium chloride (OCEAN) 0.65 % SOLN nasal spray, Place 1 spray into both nostrils as needed for congestion., Disp: , Rfl:   No Known Allergies  Objective:   There were no vitals taken for this visit.  Patient is well-developed, well-nourished in no acute distress.  Resting comfortably at home.  Head is normocephalic, atraumatic.  No labored breathing.  Speech is clear and coherent with logical content.  Patient is alert and oriented at baseline.  + TTP L maxillary sinuses.   Assessment and Plan:   1. Recurrent sinusitis Did not complete course of Augmentin due to stomach upset. Now with recurrence of maxillary pain and congestion. Rx Doxycycline 100 mg BID x 10 days. Start daily  probiotic. Continue allergy medications and supportive measures discussed at last visit. ENT assessment if not resolving.  - doxycycline (VIBRAMYCIN) 100 MG capsule; Take 1 capsule (100 mg total) by mouth 2 (two) times daily.  Dispense: 20 capsule; Refill: 0 .   [name], [place] 03/31/2020

## 2020-03-31 NOTE — Progress Notes (Signed)
I have discussed the procedure for the virtual visit with the patient who has given consent to proceed with assessment and treatment.   Khamia Stambaugh S Kesean Serviss, CMA     

## 2020-03-31 NOTE — Patient Instructions (Signed)
Instructions sent to MyChart

## 2020-04-14 ENCOUNTER — Encounter: Payer: Self-pay | Admitting: Physician Assistant

## 2020-04-15 MED ORDER — FLUTICASONE PROPIONATE 50 MCG/ACT NA SUSP: 2.0000 | Freq: Every day | NASAL | 2 refills | Status: DC

## 2020-04-20 IMAGING — CR DG CHEST 2V
2 series · 2 of 2 positions shown · non-contrast
Comparison: Chest radiograph July 08, 2009

CLINICAL DATA: Cough and congestion for 9 days.

EXAM:
CHEST - 2 VIEW

[w chest pa]
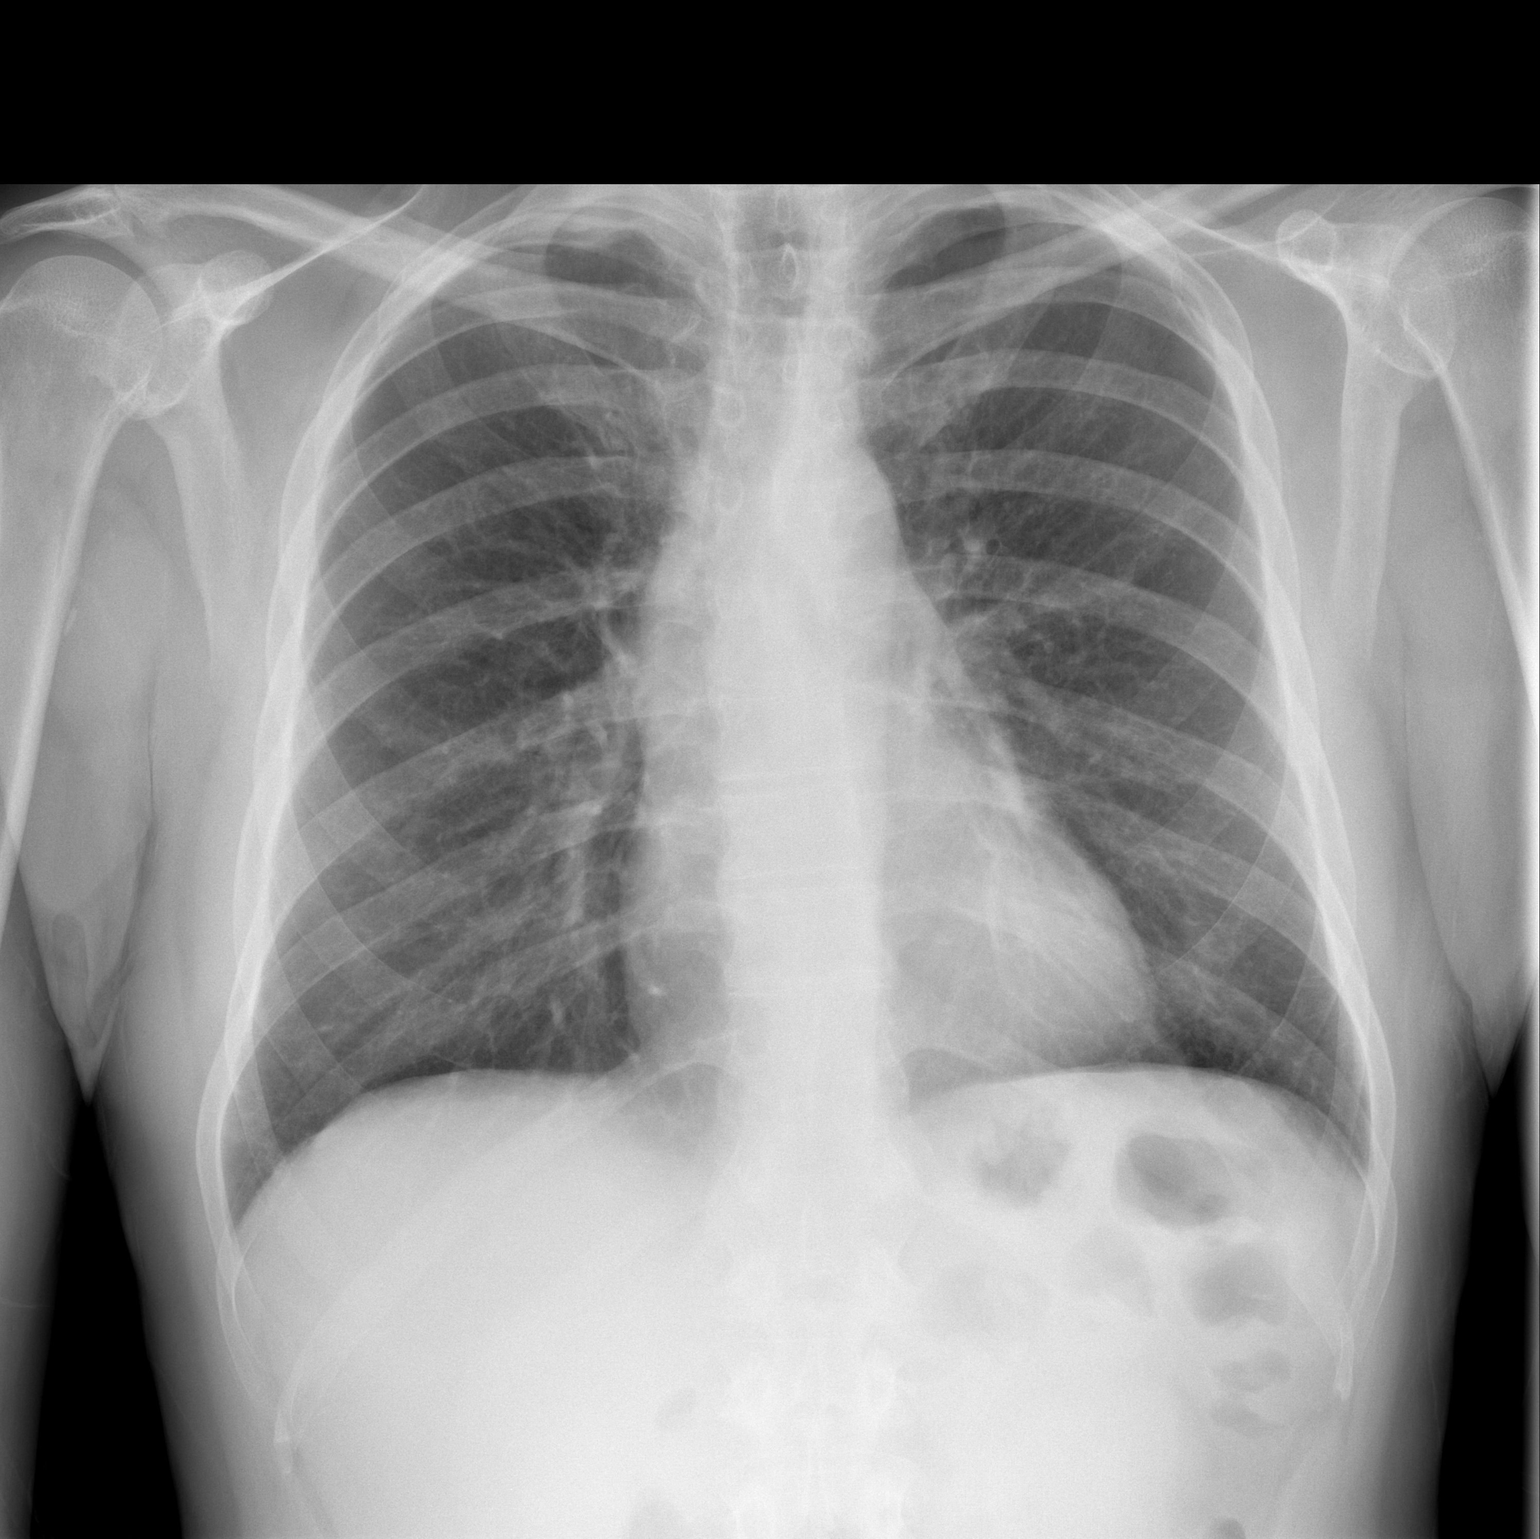

[w chest lat]
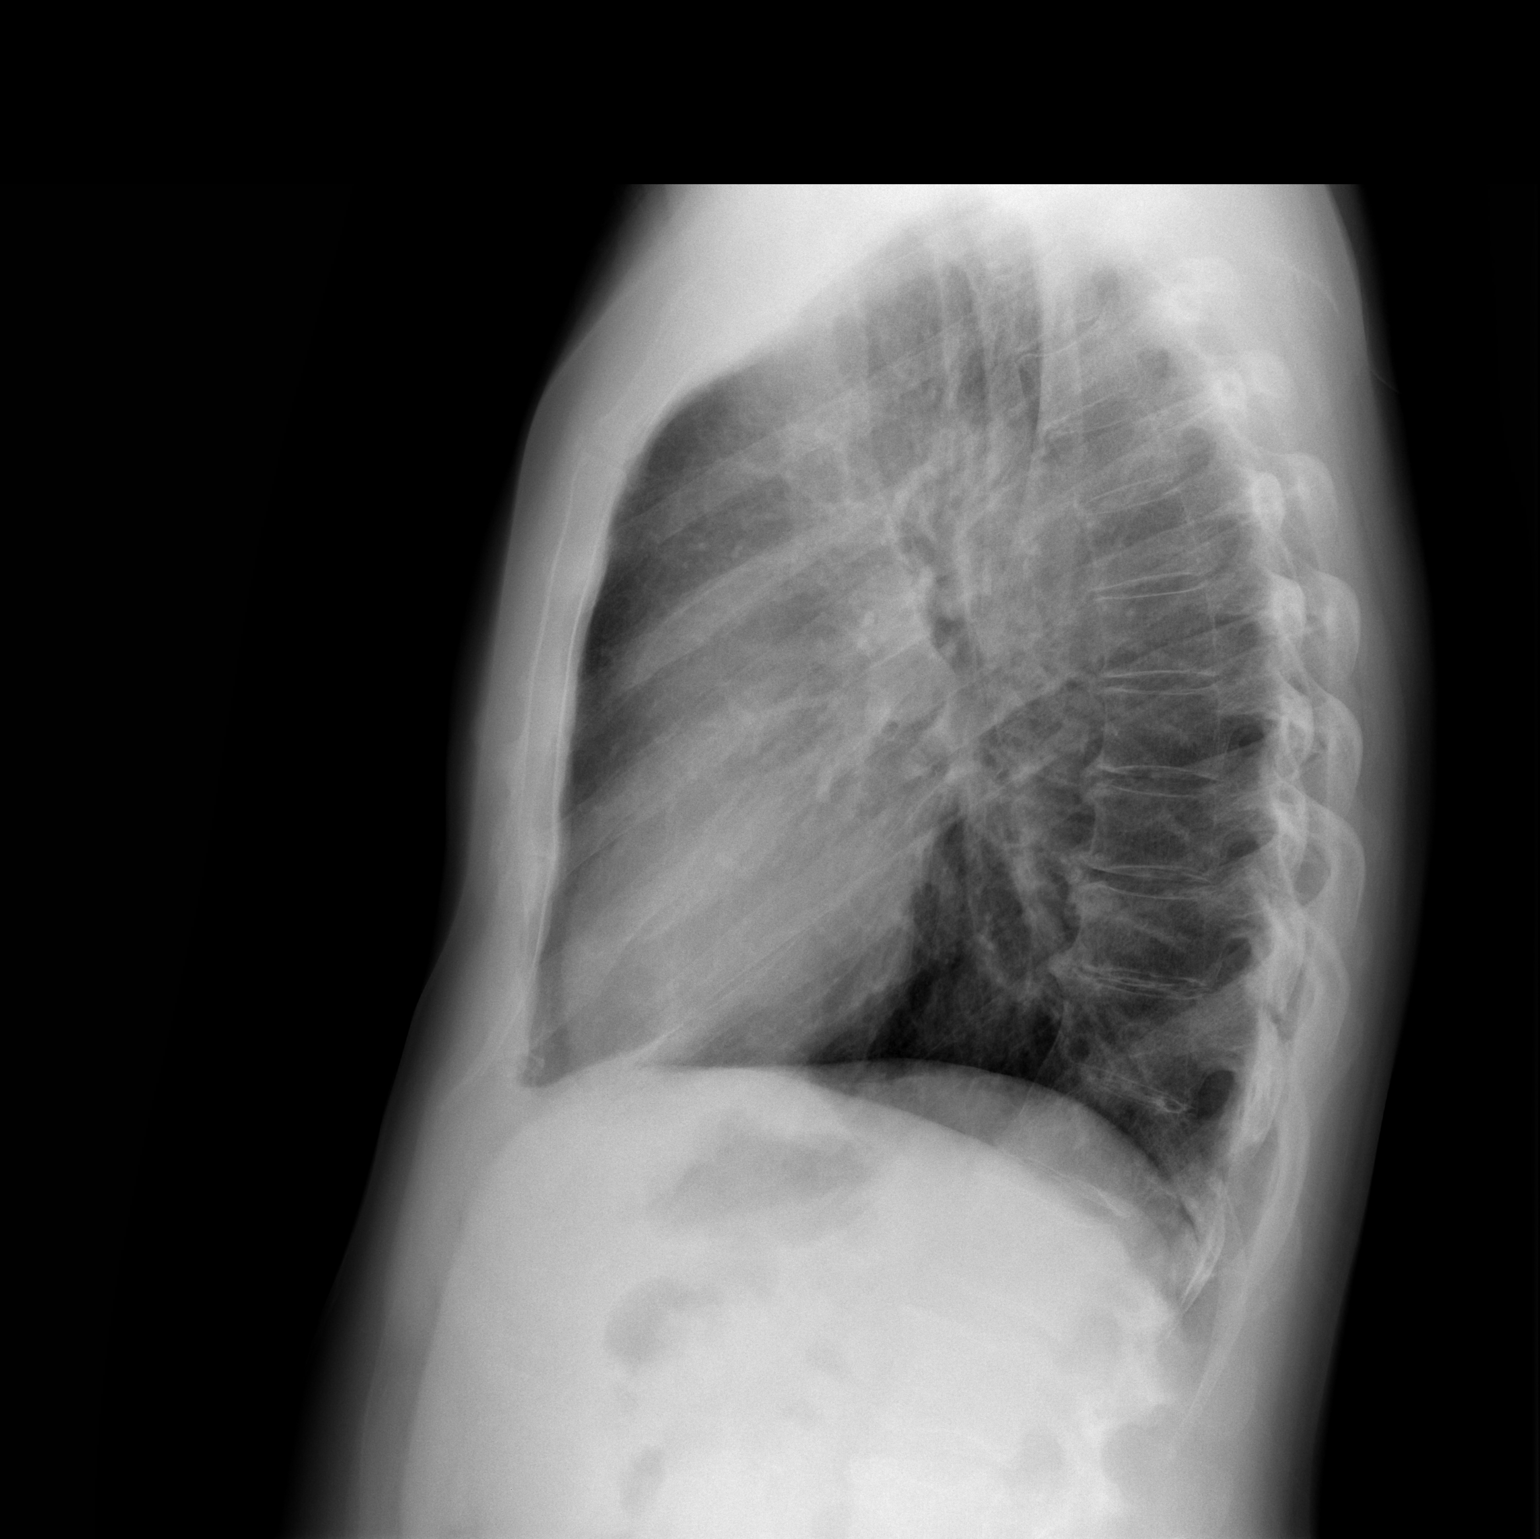

[2 of 2 positions shown; findings below may reference images not displayed]

FINDINGS: Cardiomediastinal silhouette is normal. No pleural effusions or
focal consolidations. Trachea projects midline and there is no
pneumothorax. Soft tissue planes and included osseous structures are
non-suspicious. Mild thoracic spondylosis.
IMPRESSION: Negative.

## 2020-04-21 ENCOUNTER — Telehealth: Payer: Self-pay

## 2020-04-21 NOTE — Telephone Encounter (Signed)
Called [facility] and pharmacist stated that losartan was no longer on back order and patient just needs refills. Informed them that patient can get one month supply and we will do year refill at his office visit.

## 2020-04-21 NOTE — Telephone Encounter (Signed)
Pt's pharmacy [facility] is requesting Dr. [name] to prescribe pt Losartan 25 mg tablet, because Losartan 50 mg tablets are on backorder. Please address

## 2020-04-23 ENCOUNTER — Other Ambulatory Visit: Payer: Self-pay | Admitting: Cardiovascular Disease

## 2020-04-26 NOTE — Progress Notes (Signed)
Date:  04/28/2020   ID:  David Cunningham, DOB 1956/08/08, MRN [id number]   Provider Location: Office  PCP:  Delorse Limber  Cardiologist:  [name] Electrophysiologist:  None   Evaluation Performed:  Follow-Up Visit In person   Chief Complaint:  AV Disease  History of Present Illness:     64 y.o. f/u for bicuspid AV disease. No family history of cardiac disease. No history of CAD or chest pain.  Poor diet with excess salt, smokes and has scotch most nights. Normal ETT 10/09/12 with HTN response.  Echo 03/30/17 EF 60-65% bicuspid AV with  Mild AS mean gradient 13 mmHg peak 24 mmHg mild AR and mild MR.  MRI/MRA 2015 with moderate aortic root enlargement 4.2 cm EF 72% no gadolinium uptake.  Echo 03/13/18 mean gradient 19 peak 42 mmHg moderate to severe AR  Echo 03/10/19 EF 60-65% mild LVE bicuspid valve moderate AS/AR LVIDD 53 and LVIDS 34 mm mean gradient 24 mmHg peak 47 mmHg   So mean gradients have gone from 13->19->24 mmHg  CT head HP 02/2015 no aneurysm normal carotid system  Dentition in good shape  Youngest son graduated from [employer] And is home now  Step daughter in [place] and oldest son in [place]  He feels great retired this year from [employer] received COVID vaccine  09/25/19 Nasal septoplasty/turbinate reduction Wilburn Cornelia Rx with Augmentin for bacterial sinusitis 03/12/20   He is retired   The patient does not have symptoms concerning for COVID-19 infection (fever, chills, cough, or new shortness of breath).    Past Medical History:  Diagnosis Date  . Allergy   . Arthritis    hand, neck  . Back pain 09/11/2016   patient denies back pain but has neck pain  . Bicuspid aortic valve   . DEGENERATIVE DISC DISEASE, CERVICAL SPINE   . Heart murmur    never has caused any problems  . History of chicken pox   . Hypertension   . Migraines    last one 2 wks ago- allergy related   . MITRAL VALVE PROLAPSE   . PSA, INCREASED   . TINNITUS,  CHRONIC, BILATERAL    Past Surgical History:  Procedure Laterality Date  . CERVICAL DISC SURGERY  12/2019  . CERVICAL SPINE SURGERY    . COLONOSCOPY  2012   [place] hx polyps  . CYST REMOVAL NECK     And Face  . NASAL SEPTOPLASTY W/ TURBINOPLASTY Bilateral 09/25/2019   Procedure: NASAL SEPTOPLASTY WITH TURBINATE REDUCTION;  Surgeon: [name], MD;  Location: [place];  Service: ENT;  Laterality: Bilateral;  . POLYPECTOMY     Colon  . PROSTATE BIOPSY  06/2019   negative  . SINUS ENDO WITH FUSION Bilateral 09/25/2019   Procedure: ENDOSCOPIC SINUS SURGERY WITH FUSION NAVIGATION;  Surgeon: [name], MD;  Location: [place];  Service: ENT;  Laterality: Bilateral;  . Tonsillectome    . WISDOM TOOTH EXTRACTION       No outpatient medications have been marked as taking for the 04/28/20 encounter (Office Visit) with Josue Hector, MD.     Allergies:   Patient has no known allergies.   Social History   Tobacco Use  . Smoking status: Current Every Day Smoker    Packs/day: 1.00    Years: 40.00    Pack years: 40.00    Types: Cigarettes  . Smokeless tobacco: Never Used  Vaping Use  .  Vaping Use: Never used  Substance Use Topics  . Alcohol use: Yes    Alcohol/week: 7.0 standard drinks    Types: 7 Standard drinks or equivalent per week    Comment: 1 glass of scotch daily  . Drug use: No     Family Hx: The patient's family history includes Breast cancer in his sister; Cancer in his maternal aunt, paternal aunt, and other family members; Colon cancer (age of onset: 32) in his mother; Diabetes in his father; Emphysema in his maternal grandfather and paternal grandfather; Liver cancer in his mother; Lung cancer in his mother; Lymphoma (age of onset: 22) in his father. There is no history of Esophageal cancer, Stomach cancer, or Rectal cancer.  ROS:   Please see the history of present illness.     All other systems reviewed and are  negative.   Prior CV studies:   The following studies were reviewed today:  Echo 03/10/19 compared to ones done 03/30/17 and 03/13/18   Labs/Other Tests and Data Reviewed:    EKG:   03/14/18 SR rate 58 normal 03/10/19 SR rate 52 normal 04/28/20 SR rate 58 normal   Recent Labs: 01/19/2020: ALT 15; BUN 19; Creatinine, Ser 0.92; Hemoglobin 16.5; Platelets 210.0; Potassium 5.0; Sodium 140   Recent Lipid Panel Lab Results  Component Value Date/Time   CHOL 165 01/19/2020 08:53 AM   TRIG 38.0 01/19/2020 08:53 AM   HDL 85.60 01/19/2020 08:53 AM   CHOLHDL 2 01/19/2020 08:53 AM   LDLCALC 72 01/19/2020 08:53 AM    Wt Readings from Last 3 Encounters:  04/28/20 164 lb (74.4 kg)  01/19/20 163 lb (73.9 kg)  10/27/19 162 lb 9.6 oz (73.8 kg)     Objective:    Vital Signs:  BP (!) 130/58   Pulse (!) 58   Ht 5\' 9"  (1.753 m)   Wt 164 lb (74.4 kg)   SpO2 99%   BMI 24.22 kg/m    Affect appropriate Healthy:  appears stated age HEENT: normal Neck supple with no adenopathy JVP normal no bruits no thyromegaly Lungs clear with no wheezing and good diaphragmatic motion Heart:  S1/S2 AS/AR murmur, no rub, gallop or click PMI normal Abdomen: benighn, BS positve, no tenderness, no AAA no bruit.  No HSM or HJR Distal pulses intact with no bruits No edema Neuro non-focal Skin warm and dry No muscular weakness   ASSESSMENT & PLAN:    Bicuspid AV:  With moderate AS and moderate AR Compensated LV and no symptoms CXR no aortic root dilatation 01/07/19 and lung cancer screening CT ordered by primary F/U echo ordered  Discussed utility of cardiac CTA to do aortic valve calcium score measure aortic root and assess for CAD   Allergies:  Continue zyrtec consider flonase  F/u primary  ETOH:  Encouraged more moderation in intake labs/LFTls with primary Smoking:  Counseled on smoking cessation for less than 10 minutes Has had script for Chantix Assess lung fields on CT see above  HTN:  Increase cozaar to 50 mg  daily  ENT:  Post septoplasty had some sinusitis despite surgery f/u ENT Wilburn Cornelia  COVID-19 Education: The signs and symptoms of COVID-19 were discussed with the patient and how to seek care for testing (follow up with PCP or arrange E-visit).  The importance of social distancing was discussed today.     Medication Adjustments/Labs and Tests Ordered: Current medicines are reviewed at length with the patient today.  Concerns regarding medicines are outlined above.  Tests Ordered:  Echo for bicuspid AV AS/AR   Cardiac CTA for aortic aneurysm bicuspid AV, lung fields in smoker Aortic Valve calcium score and r/o CAD in setting of significant valve disease    Medication Changes: No orders of the defined types were placed in this encounter.   Disposition:  Follow up  In a year  If echo and cardiac CTA ok   Signed, Jenkins Rouge, MD  04/28/2020 10:14 AM    [place]

## 2020-04-28 ENCOUNTER — Other Ambulatory Visit: Payer: Self-pay

## 2020-04-28 ENCOUNTER — Ambulatory Visit: Payer: 59 | Admitting: Cardiovascular Disease

## 2020-04-28 ENCOUNTER — Encounter: Payer: Self-pay | Admitting: Cardiovascular Disease

## 2020-04-28 VITALS — BP 130/58 | HR 58 | Ht 69.0 in | Wt 164.0 lb

## 2020-04-28 DIAGNOSIS — I350 Nonrheumatic aortic (valve) stenosis: Secondary | ICD-10-CM

## 2020-04-28 DIAGNOSIS — R079 Chest pain, unspecified: Secondary | ICD-10-CM | POA: Diagnosis not present

## 2020-04-28 DIAGNOSIS — Q231 Congenital insufficiency of aortic valve: Secondary | ICD-10-CM

## 2020-04-28 MED ORDER — METOPROLOL TARTRATE 25 MG PO TABS
ORAL_TABLET | ORAL | 0 refills | Status: DC
Start: 2020-04-28 — End: 2020-11-19

## 2020-04-28 MED ORDER — LOSARTAN POTASSIUM 50 MG PO TABS
50.0000 mg | ORAL_TABLET | Freq: Every day | ORAL | 3 refills | Status: DC
Start: 2020-04-28 — End: 2020-11-30

## 2020-04-28 NOTE — Patient Instructions (Addendum)
Medication Instructions:  *If you need a refill on your cardiac medications before your next appointment, please call your pharmacy*  Lab Work: If you have labs (blood work) drawn today and your tests are completely normal, you will receive your results only by: Marland Kitchen MyChart Message (if you have MyChart) OR . A paper copy in the mail If you have any lab test that is abnormal or we need to change your treatment, we will call you to review the results.  Testing/Procedures: Your physician has requested that you have cardiac CT. Cardiac computed tomography (CT) is a painless test that uses an x-ray machine to take clear, detailed pictures of your heart. For further information please visit [email or web address].tn. Please follow instruction sheet as given.  Your physician has requested that you have an echocardiogram. Echocardiography is a painless test that uses sound waves to create images of your heart. It provides your doctor with information about the size and shape of your heart and how well your heart's chambers and valves are working. This procedure takes approximately one hour. There are no restrictions for this procedure.  Follow-Up: At [facility], you and your health needs are our priority.  As part of our continuing mission to provide you with exceptional heart care, we have created designated Provider Care Teams.  These Care Teams include your primary Cardiologist (physician) and Advanced Practice Providers (APPs -  Physician Assistants and Nurse Practitioners) who all work together to provide you with the care you need, when you need it.  We recommend signing up for the patient portal called "MyChart".  Sign up information is provided on this After Visit Summary.  MyChart is used to connect with patients for Virtual Visits (Telemedicine).  Patients are able to view lab/test results, encounter notes, upcoming appointments, etc.  Non-urgent messages can be sent to your provider as well.   To  learn more about what you can do with MyChart, go to [email or web address].    Your next appointment:   6 month(s)  The format for your next appointment:   In Person  Provider:   You may see Jenkins Rouge, MD or one of the following Advanced Practice Providers on your designated Care Team:    Truitt Merle, NP  Cecilie Kicks, NP  Kathyrn Drown, NP   Your cardiac CT will be scheduled at one of the below locations:   [facility] [address] [place], [place] [address] [phone]  If scheduled at [facility], please arrive at the [facility] main entrance of [facility] 30 minutes prior to test start time. Proceed to the [facility] Radiology Department (first floor) to check-in and test prep.  Please follow these instructions carefully (unless otherwise directed):  Hold all erectile dysfunction medications at least 3 days (72 hrs) prior to test.  On the Night Before the Test: . Be sure to Drink plenty of water. . Do not consume any caffeinated/decaffeinated beverages or chocolate 12 hours prior to your test. . Do not take any antihistamines 12 hours prior to your test.  On the Day of the Test: . Drink plenty of water. Do not drink any water within one hour of the test. . Do not eat any food 4 hours prior to the test. . You may take your regular medications prior to the test.  . Take metoprolol (Lopressor) 25 mg two hours prior to test.      After the Test: . Drink plenty of water. . After receiving  IV contrast, you may experience a mild flushed feeling. This is normal. . On occasion, you may experience a mild rash up to 24 hours after the test. This is not dangerous. If this occurs, you can take Benadryl 25 mg and increase your fluid intake. . If you experience trouble breathing, this can be serious. If it is severe call 911 IMMEDIATELY. If it is mild, please call our office.  Once we have confirmed authorization from your insurance  company, we will call you to set up a date and time for your test.   For non-scheduling related questions, please contact the cardiac imaging nurse navigator should you have any questions/concerns: Marchia Bond, Cardiac Imaging Nurse Navigator Burley Saver, Interim Cardiac Imaging Nurse [place] [facility] Direct Office Dial: [phone]   For scheduling needs, including cancellations and rescheduling, please call [phone].

## 2020-05-11 ENCOUNTER — Other Ambulatory Visit: Payer: Self-pay

## 2020-05-11 DIAGNOSIS — Q231 Congenital insufficiency of aortic valve: Secondary | ICD-10-CM

## 2020-05-19 ENCOUNTER — Ambulatory Visit (HOSPITAL_COMMUNITY): Payer: No Typology Code available for payment source | Attending: Cardiology

## 2020-05-19 ENCOUNTER — Other Ambulatory Visit: Payer: No Typology Code available for payment source | Admitting: *Deleted

## 2020-05-19 ENCOUNTER — Other Ambulatory Visit: Payer: Self-pay

## 2020-05-19 DIAGNOSIS — I350 Nonrheumatic aortic (valve) stenosis: Secondary | ICD-10-CM

## 2020-05-19 DIAGNOSIS — R079 Chest pain, unspecified: Secondary | ICD-10-CM

## 2020-05-19 DIAGNOSIS — Q231 Congenital insufficiency of aortic valve: Secondary | ICD-10-CM

## 2020-05-19 LAB — BASIC METABOLIC PANEL
BUN/Creatinine Ratio: 20 (ref 10–24)
BUN: 19 mg/dL (ref 8–27)
CO2: 24 mmol/L (ref 20–29)
Calcium: 9.1 mg/dL (ref 8.6–10.2)
Chloride: 103 mmol/L (ref 96–106)
Creatinine, Ser: 0.97 mg/dL (ref 0.76–1.27)
GFR calc Af Amer: 96 mL/min/{1.73_m2} (ref >59)
GFR calc non Af Amer: 83 mL/min/{1.73_m2} (ref >59)
Glucose: 87 mg/dL (ref 65–99)
Potassium: 4.9 mmol/L (ref 3.5–5.2)
Sodium: 141 mmol/L (ref 134–144)

## 2020-05-24 ENCOUNTER — Telehealth (HOSPITAL_COMMUNITY): Payer: Self-pay | Admitting: *Deleted

## 2020-05-24 NOTE — Telephone Encounter (Signed)
Pt returning call regarding upcoming cardiac imaging study; pt verbalizes understanding of appt date/time, parking situation and where to check in, pre-test NPO status and medications ordered, and verified current allergies; name and call back number provided for further questions should they arise  Merle Tai RN Navigator Cardiac Imaging [facility] [phone] office [phone] cell  

## 2020-05-24 NOTE — Telephone Encounter (Signed)
Attempted to call patient regarding upcoming cardiac CT appointment. °Left message on voicemail with name and callback number ° °Labrenda Lasky Tai RN Navigator Cardiac Imaging °[facility] °[phone] Office °[phone] Cell ° °

## 2020-05-25 ENCOUNTER — Ambulatory Visit (HOSPITAL_COMMUNITY)
Admission: RE | Admit: 2020-05-25 | Discharge: 2020-05-25 | Disposition: A | Discharge status: Home / Self Care | Payer: No Typology Code available for payment source | Source: Ambulatory Visit | Attending: Cardiovascular Disease | Admitting: Cardiovascular Disease

## 2020-05-25 DIAGNOSIS — Q231 Congenital insufficiency of aortic valve: Secondary | ICD-10-CM | POA: Insufficient documentation

## 2020-05-25 DIAGNOSIS — R079 Chest pain, unspecified: Secondary | ICD-10-CM | POA: Diagnosis present

## 2020-05-25 DIAGNOSIS — I350 Nonrheumatic aortic (valve) stenosis: Secondary | ICD-10-CM | POA: Diagnosis not present

## 2020-05-25 MED ORDER — NITROGLYCERIN 0.4 MG SL SUBL
SUBLINGUAL_TABLET | SUBLINGUAL | Status: AC
  Filled 2020-05-25: qty 2

## 2020-05-25 MED ORDER — IOHEXOL 350 MG/ML SOLN
100.0000 mL | Freq: Once | INTRAVENOUS | Status: AC | PRN
  Administered 2020-05-25: 80 mL via INTRAVENOUS

## 2020-05-25 MED ORDER — NITROGLYCERIN 0.4 MG SL SUBL
0.8000 mg | SUBLINGUAL_TABLET | Freq: Once | SUBLINGUAL | Status: AC
  Administered 2020-05-25: 0.8 mg via SUBLINGUAL

## 2020-08-13 IMAGING — DX CERVICAL SPINE - COMPLETE 4+ VIEW
6 series · 6 of 6 positions shown · non-contrast
Comparison: None.

CLINICAL DATA: Left radiculopathy

EXAM:
CERVICAL SPINE - COMPLETE 4+ VIEW

[c-spine lat]
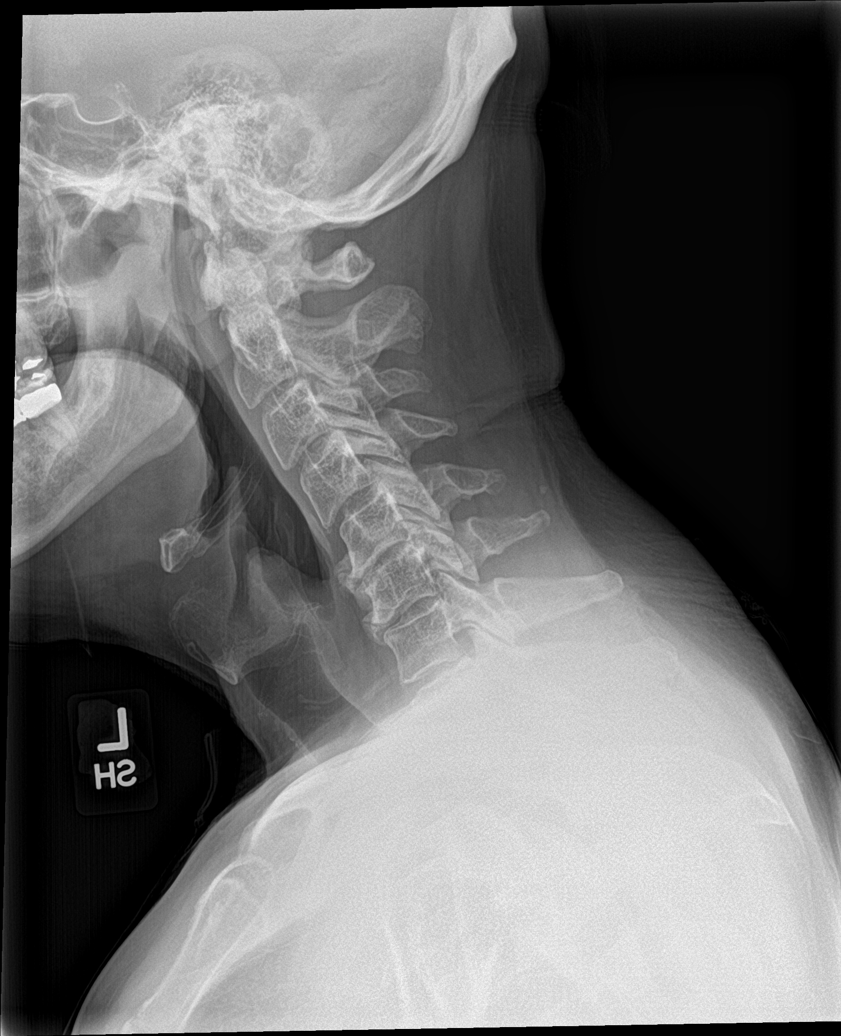

[c-spine obl (1 of 2)]
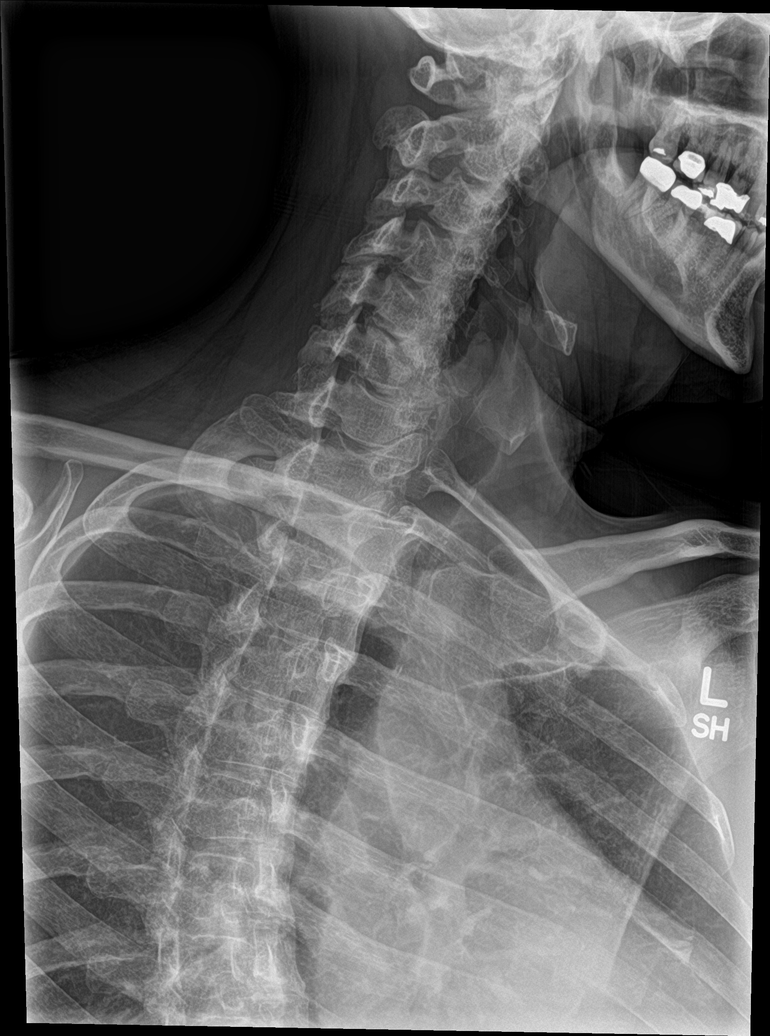

[c-spine obl (2 of 2)]
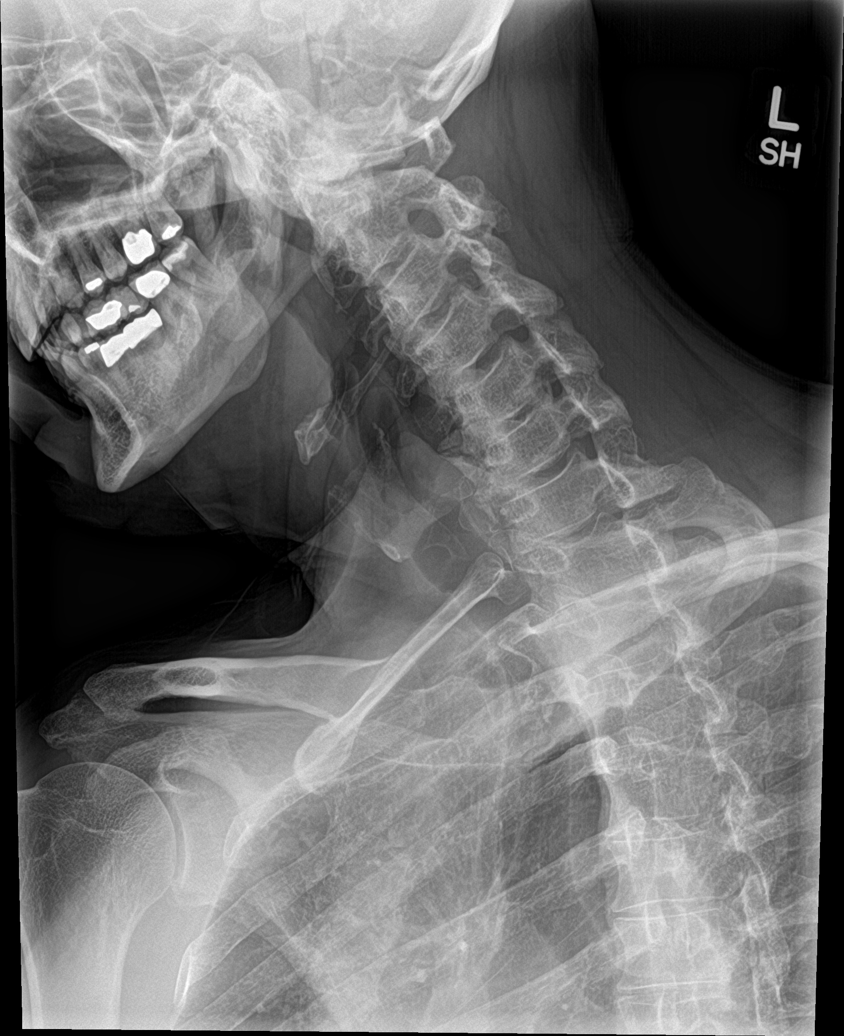

[c-spine ap]
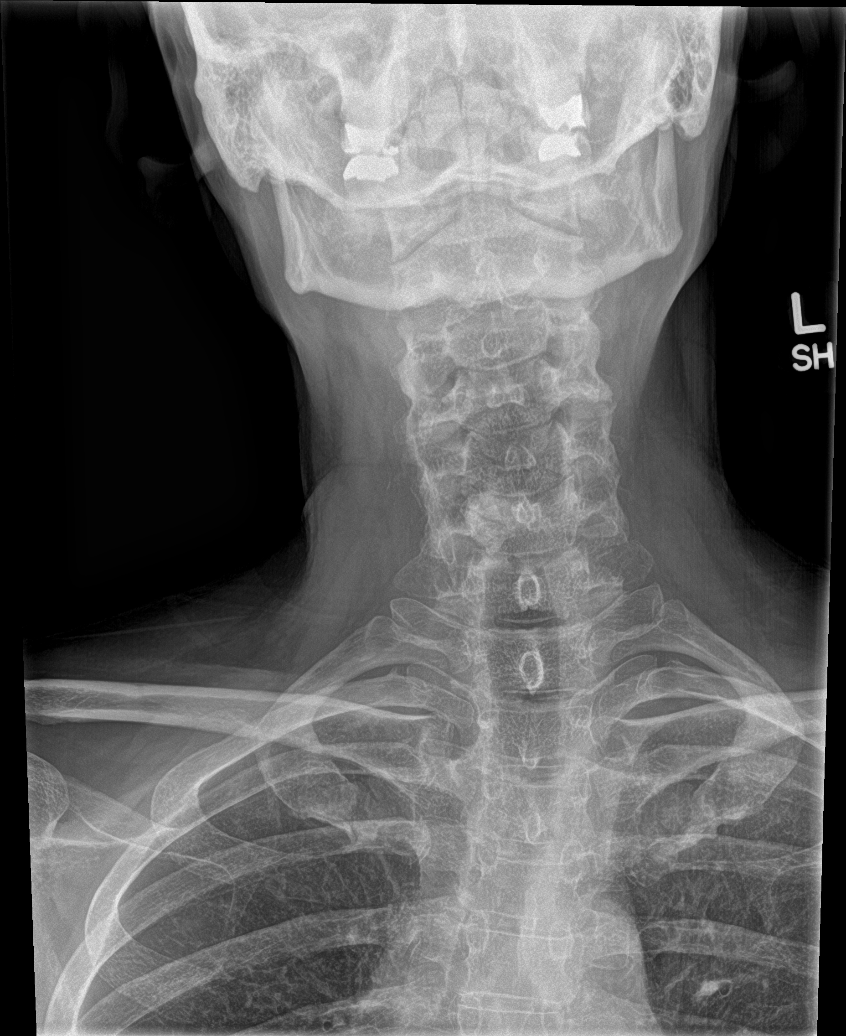

[c-spine open mouth]
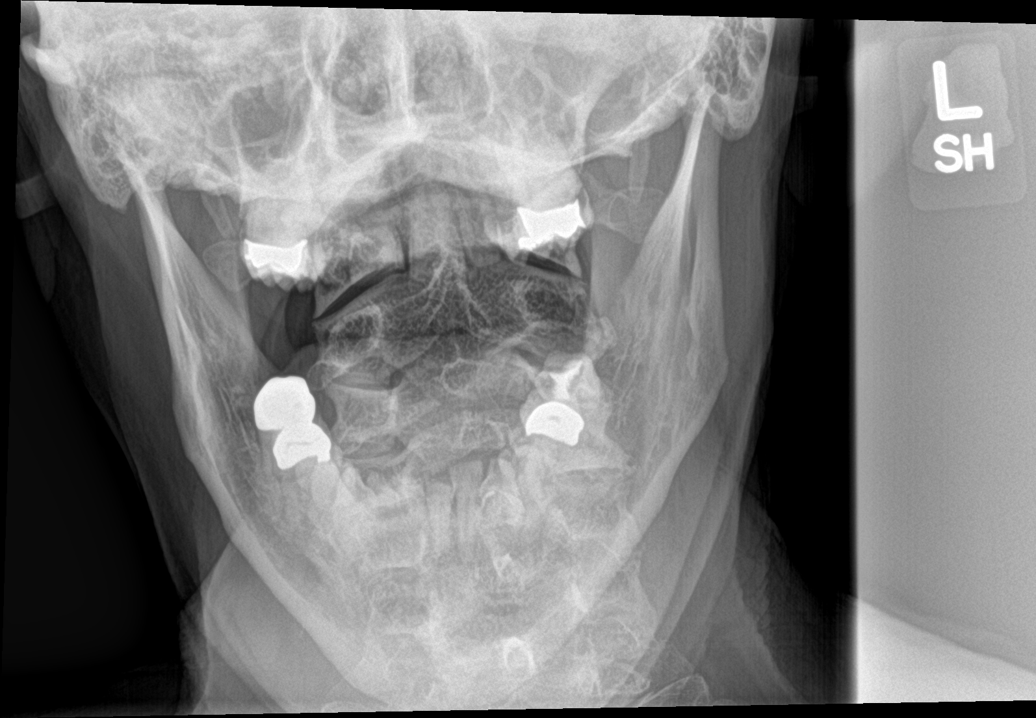

[[person_name]]
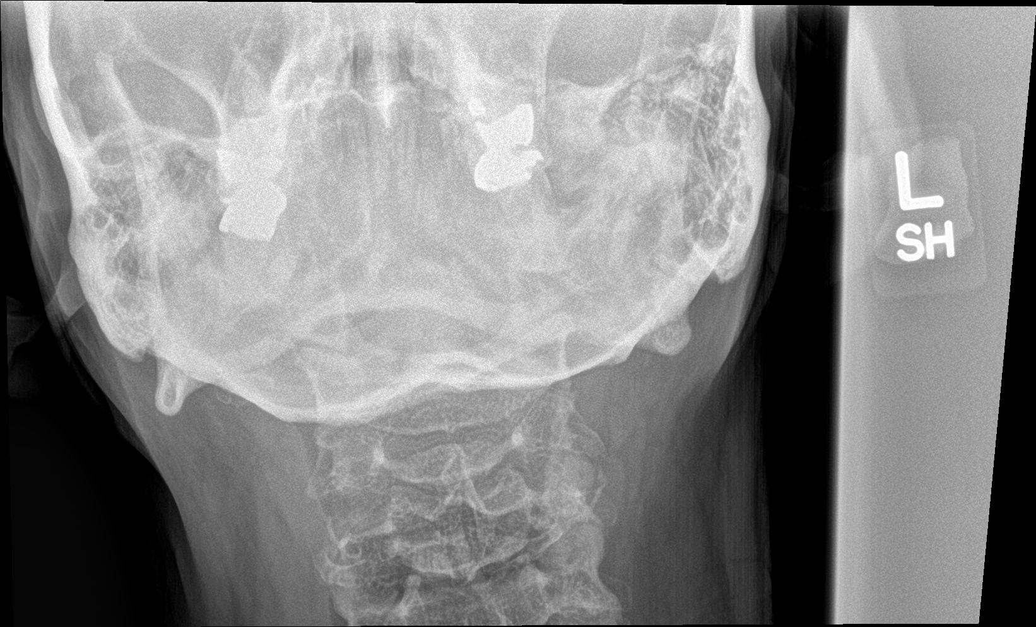

[6 of 6 positions shown; findings below may reference images not displayed]

FINDINGS: Mild reversal of cervical lordosis. Normal prevertebral soft tissue
thickness. Vertebral body heights are maintained. Moderate
degenerative changes at C5-C6 and C6-C7. Mild right foraminal
narrowing C4-C5, C5-C6 and mild left foraminal narrowing C5-C6. Dens
and lateral masses are within normal limits.
IMPRESSION: 1. No acute osseous abnormality.
2. Reversal of cervical lordosis with moderate degenerative changes
at C5-C6 and C6-C7.

## 2020-10-18 ENCOUNTER — Ambulatory Visit: Payer: No Typology Code available for payment source | Admitting: Cardiovascular Disease

## 2020-11-15 NOTE — Progress Notes (Signed)
Date:  11/19/2020   ID:  David Cunningham, DOB 1955/12/08, MRN [id number]   Provider Location: Office  PCP:  Delorse Limber  Cardiologist:  [name] Electrophysiologist:  None   Evaluation Performed:  Follow-Up Visit In person   Chief Complaint:  AV Disease  History of Present Illness:     65 y.o. f/u for bicuspid AV disease. No family history of cardiac disease. No history of CAD or chest pain.  Poor diet with excess salt, smokes and has scotch most nights. Normal ETT 10/09/12 with HTN response.  Echo 03/30/17 EF 60-65% bicuspid AV with  Mild AS mean gradient 13 mmHg peak 24 mmHg mild AR and mild MR.  MRI/MRA 2015 with moderate aortic root enlargement 4.2 cm EF 72% no gadolinium uptake.  Echo 05/19/20 stable with mean gradient 21 mmHg Peak 39 mmHg DVI 0.35 EF 60-65%   Cardiac CTA 05/25/20 calcium score 22 isolated to LAD only 27 th percentile for age/sex. Left dominant with no obstructive disease CAD RADS 1 AV calcium score 534 bicuspid valve fused right and left cusps Ascending aortic root 4.2 cm   CT head HP 02/2015 no aneurysm normal carotid system  Dentition in good shape  Youngest son graduated from [employer] And is home now  Step daughter in [place] and oldest son in [place]  He feels great retired this year from [employer] received COVID vaccine  09/25/19 Nasal septoplasty/turbinate reduction [facility] Rx with Augmentin for bacterial sinusitis 03/12/20     The patient does not have symptoms concerning for COVID-19 infection (fever, chills, cough, or new shortness of breath).    Past Medical History:  Diagnosis Date  . Allergy   . Arthritis    hand, neck  . Back pain 09/11/2016   patient denies back pain but has neck pain  . Bicuspid aortic valve   . DEGENERATIVE DISC DISEASE, CERVICAL SPINE   . Heart murmur    never has caused any problems  . History of chicken pox   . Hypertension   . Migraines    last one 2 wks ago- allergy related   .  MITRAL VALVE PROLAPSE   . PSA, INCREASED   . TINNITUS, CHRONIC, BILATERAL    Past Surgical History:  Procedure Laterality Date  . CERVICAL DISC SURGERY  12/2019  . CERVICAL SPINE SURGERY    . COLONOSCOPY  2012   [facility] hx polyps  . CYST REMOVAL NECK     And Face  . NASAL SEPTOPLASTY W/ TURBINOPLASTY Bilateral 09/25/2019   Procedure: NASAL SEPTOPLASTY WITH TURBINATE REDUCTION;  Surgeon: [name], MD;  Location: [place];  Service: ENT;  Laterality: Bilateral;  . POLYPECTOMY     Colon  . PROSTATE BIOPSY  06/2019   negative  . SINUS ENDO WITH FUSION Bilateral 09/25/2019   Procedure: ENDOSCOPIC SINUS SURGERY WITH FUSION NAVIGATION;  Surgeon: [name], MD;  Location: [place];  Service: ENT;  Laterality: Bilateral;  . Tonsillectome    . WISDOM TOOTH EXTRACTION       Current Meds  Medication Sig  . cetirizine (ZYRTEC) 5 MG tablet Take 10 mg by mouth daily.  . diclofenac Sodium (VOLTAREN) 1 % GEL Apply 2 g topically 4 (four) times daily.  . fluticasone (FLONASE) 50 MCG/ACT nasal spray Place 2 sprays into both nostrils daily.  Marland Kitchen losartan (COZAAR) 50 MG tablet Take 1 tablet (50 mg total) by mouth daily.  . sodium chloride (OCEAN)  0.65 % SOLN nasal spray Place 1 spray into both nostrils as needed for congestion.     Allergies:   Patient has no known allergies.   Social History   Tobacco Use  . Smoking status: Current Every Day Smoker    Packs/day: 1.00    Years: 40.00    Pack years: 40.00    Types: Cigarettes  . Smokeless tobacco: Never Used  Vaping Use  . Vaping Use: Never used  Substance Use Topics  . Alcohol use: Yes    Alcohol/week: 7.0 standard drinks    Types: 7 Standard drinks or equivalent per week    Comment: 1 glass of scotch daily  . Drug use: No     Family Hx: The patient's family history includes Breast cancer in his sister; Cancer in his maternal aunt, paternal aunt, and other family members; Colon cancer  (age of onset: 65) in his mother; Diabetes in his father; Emphysema in his maternal grandfather and paternal grandfather; Liver cancer in his mother; Lung cancer in his mother; Lymphoma (age of onset: 28) in his father. There is no history of Esophageal cancer, Stomach cancer, or Rectal cancer.  ROS:   Please see the history of present illness.     All other systems reviewed and are negative.   Prior CV studies:   The following studies were reviewed today:  Echo 03/10/19 compared to ones done 03/30/17 and 03/13/18   Labs/Other Tests and Data Reviewed:    EKG:   03/14/18 SR rate 58 normal 03/10/19 SR rate 52 normal 04/28/20 SR rate 58 normal   Recent Labs: 01/19/2020: ALT 15; Hemoglobin 16.5; Platelets 210.0 05/19/2020: BUN 19; Creatinine, Ser 0.97; Potassium 4.9; Sodium 141   Recent Lipid Panel Lab Results  Component Value Date/Time   CHOL 165 01/19/2020 08:53 AM   TRIG 38.0 01/19/2020 08:53 AM   HDL 85.60 01/19/2020 08:53 AM   CHOLHDL 2 01/19/2020 08:53 AM   LDLCALC 72 01/19/2020 08:53 AM    Wt Readings from Last 3 Encounters:  11/19/20 74.7 kg  04/28/20 74.4 kg  01/19/20 73.9 kg     Objective:    Vital Signs:  BP (!) 144/70   Pulse 67   Ht 5\' 9"  (1.753 m)   Wt 74.7 kg   SpO2 98%   BMI 24.31 kg/m    Affect appropriate Healthy:  appears stated age HEENT: normal Neck supple with no adenopathy JVP normal no bruits no thyromegaly Lungs clear with no wheezing and good diaphragmatic motion Heart:  S1/S2 AS/AR murmur, no rub, gallop or click PMI normal Abdomen: benighn, BS positve, no tenderness, no AAA no bruit.  No HSM or HJR Distal pulses intact with no bruits No edema Neuro non-focal Skin warm and dry No muscular weakness   ASSESSMENT & PLAN:    Bicuspid AV:  moderate AS by echo 05/19/20 aortic root 4.2 cm and no significant CAD by cardiac CT Allergies:  Continue zyrtec consider flonase  F/u primary  ETOH:  Encouraged more moderation in intake labs/LFTls with  primary Smoking:  Counseled on smoking cessation for less than 10 minutes Has had script for Chantix Assess lung fields on CT see above  HTN:  Improved on higher dose Cozaar  ENT:  Post septoplasty had some sinusitis despite surgery f/u ENT [facility] Cornelia  COVID-19 Education: The signs and symptoms of COVID-19 were discussed with the patient and how to seek care for testing (follow up with PCP or arrange E-visit).  The importance of social  distancing was discussed today.     Medication Adjustments/Labs and Tests Ordered: Current medicines are reviewed at length with the patient today.  Concerns regarding medicines are outlined above.   Tests Ordered:  Echo July 2022 AVD   Medication Changes: No orders of the defined types were placed in this encounter.   Disposition:  Follow up  In a year   Signed, Jenkins Rouge, MD  11/19/2020 10:25 AM    [place]

## 2020-11-19 ENCOUNTER — Ambulatory Visit (INDEPENDENT_AMBULATORY_CARE_PROVIDER_SITE_OTHER): Payer: 59 | Admitting: Cardiovascular Disease

## 2020-11-19 ENCOUNTER — Other Ambulatory Visit: Payer: Self-pay

## 2020-11-19 ENCOUNTER — Encounter: Payer: Self-pay | Admitting: Cardiovascular Disease

## 2020-11-19 VITALS — BP 144/70 | HR 67 | Ht 69.0 in | Wt 164.6 lb

## 2020-11-19 DIAGNOSIS — I350 Nonrheumatic aortic (valve) stenosis: Secondary | ICD-10-CM | POA: Diagnosis not present

## 2020-11-19 NOTE — Patient Instructions (Addendum)
Medication Instructions:  *If you need a refill on your cardiac medications before your next appointment, please call your pharmacy*  Testing/Procedures: Your physician has requested that you have an echocardiogram in JULY 2022. Echocardiography is a painless test that uses sound waves to create images of your heart. It provides your doctor with information about the size and shape of your heart and how well your heart's chambers and valves are working. This procedure takes approximately one hour. There are no restrictions for this procedure.  Follow-Up: At [facility], you and your health needs are our priority.  As part of our continuing mission to provide you with exceptional heart care, we have created designated Provider Care Teams.  These Care Teams include your primary Cardiologist (physician) and Advanced Practice Providers (APPs -  Physician Assistants and Nurse Practitioners) who all work together to provide you with the care you need, when you need it.  We recommend signing up for the patient portal called "MyChart".  Sign up information is provided on this After Visit Summary.  MyChart is used to connect with patients for Virtual Visits (Telemedicine).  Patients are able to view lab/test results, encounter notes, upcoming appointments, etc.  Non-urgent messages can be sent to your provider as well.   To learn more about what you can do with MyChart, go to [email or web address].    Your next appointment:   Your physician recommends that you schedule a follow-up appointment in: July 2022 (on same day as echo) with Dr. [name]  The format for your next appointment:   In Person with Jenkins Rouge, MD

## 2020-11-30 MED ORDER — LOSARTAN POTASSIUM 50 MG PO TABS: 50.0000 mg | ORAL_TABLET | Freq: Every day | ORAL | 3 refills | Status: DC

## 2020-12-29 ENCOUNTER — Encounter: Payer: Self-pay | Admitting: Physician Assistant

## 2021-01-26 ENCOUNTER — Ambulatory Visit (INDEPENDENT_AMBULATORY_CARE_PROVIDER_SITE_OTHER): Payer: 59 | Admitting: Orthopaedic Surgery

## 2021-01-26 ENCOUNTER — Ambulatory Visit (INDEPENDENT_AMBULATORY_CARE_PROVIDER_SITE_OTHER): Payer: 59

## 2021-01-26 ENCOUNTER — Encounter: Payer: Self-pay | Admitting: Orthopaedic Surgery

## 2021-01-26 ENCOUNTER — Ambulatory Visit: Payer: Self-pay

## 2021-01-26 ENCOUNTER — Other Ambulatory Visit: Payer: Self-pay

## 2021-01-26 DIAGNOSIS — G8929 Other chronic pain: Secondary | ICD-10-CM

## 2021-01-26 DIAGNOSIS — M79645 Pain in left finger(s): Secondary | ICD-10-CM | POA: Diagnosis not present

## 2021-01-26 DIAGNOSIS — M79644 Pain in right finger(s): Secondary | ICD-10-CM

## 2021-01-26 DIAGNOSIS — M25511 Pain in right shoulder: Secondary | ICD-10-CM

## 2021-01-26 NOTE — Progress Notes (Signed)
Office Visit Note   Patient: David Cunningham           Date of Birth: [birth year]           MRN: [id number] Visit Date: 01/26/2021              Requested by: Brunetta Jeans, PA-C [address] [place],  [place] [address] PCP: Brunetta Jeans, PA-C   Assessment & Plan: Visit Diagnoses:  1. Thumb pain, right   2. Thumb pain, left   3. Chronic right shoulder pain     Plan: I think that he would benefit most from an ultrasound guided injection and actually both basilar thumb joints and the right shoulder AC joint.  I think that this would be prudent to have all 3 injections done at once under ultrasound given the fact that he is not a diabetic and is active and in good health.  I would like to refer him as a consultation to Dr. Junius Roads for these injections because they will certainly be difficult except by a specialist who is having an ultrasound guided injections.  Dr. Junius Roads can then certainly get him a follow-up with me in 6 weeks after the injections or just as needed.  All questions and concerns were answered and addressed.  Of note the patient does use diclofenac gel/cream on his hands when he is playing golf.  He places these at the base of his thumbs.  Follow-Up Instructions: No follow-ups on file.   Orders:  Orders Placed This Encounter  Procedures  . XR Shoulder Right  . XR Finger Thumb Left  . XR Finger Thumb Right   No orders of the defined types were placed in this encounter.     Procedures: No procedures performed   Clinical Data: No additional findings.   Subjective: Chief Complaint  Patient presents with  . Right Shoulder - Pain  . Left Thumb - Pain  . Right Thumb - Pain  The patient is a very pleasant 65 year old gentleman that I am seeing for the first time as a patient but I have taken care of his wife before.  He is right-hand dominant.  He comes in with bilateral thumb pain at the base of the thumb joint on both sides with the right worse than left  this been going on for 3 to 4 years now.  He denies any weakness but is certainly painful with activities such as golf and with gripping and pinching activities.  He has a history of a two-level cervical fusion.  He has been reporting some right shoulder pain with activities that has been slowly getting worse but has felt better over the last week or so.  He points to the [facility] joint area in the trapezius area as a source of his pain.  There is no radicular component of this pain on the right side.  He has had no other acute change in medical status.  He is a thin individual and very active and not a diabetic.  HPI  Review of Systems There is currently listed no headache, chest pain, shortness of breath, fever, chills, nausea, vomiting  Objective: Vital Signs: There were no vitals taken for this visit.  Physical Exam He is alert and orient x3 and in no acute distress Ortho Exam Examination of his right shoulder shows full and fluid range of motion of the right shoulder.  His rotator cuff is strong.  His liftoff is negative.  His pain  seems to be more around the trapezius area and some of the anterior clavicle and at the [facility] joint on the right side.  Examination of both thumbs shows positive grind test at the basilar thumb joint.  There is no triggering.  He has a palpable pulse in both wrists with good pinch and grip strength that is painful at the base of the thumb.  His Tinel's and Phalen's exams are normal. Specialty Comments:  No specialty comments available.  Imaging: XR Finger Thumb Left  Result Date: 01/26/2021 Several views left thumb show severe basilar thumb joint/CMC joint arthritis with joint space narrowing and para-articular osteophytes.  XR Finger Thumb Right  Result Date: 01/26/2021 Several views of the right thumb show severe basilar thumb joint/CMC joint arthritis with joint space narrowing and periarticular osteophytes.  XR Shoulder Right  Result Date: 01/26/2021 3 views of  the right shoulder show AC joint arthritic changes.  The glenohumeral joint is well-maintained and the shoulder is well located.    PMFS History: Patient Active Problem List   Diagnosis Date Noted  . Cervical spondylosis with radiculopathy 06/16/2019  . Chronic pansinusitis 04/21/2019  . Nasal turbinate hypertrophy 03/19/2019  . Nasal septal deviation 03/19/2019  . Encounter for screening for lung cancer 01/14/2018  . Right-sided thoracic back pain 09/11/2016  . Situational anxiety 09/04/2016  . Warts of foot 10/25/2015  . Visit for preventive health examination 10/06/2014  . Prostate cancer screening 10/06/2014  . Bicuspid aortic valve 09/24/2012  . Hypertension 09/24/2012  . DEGENERATIVE DISC DISEASE, CERVICAL SPINE 02/18/2010  . TOBACCO ABUSE 07/08/2009  . TINNITUS, CHRONIC, BILATERAL 07/08/2009  . Mitral valve disorder 07/08/2009  . Chronic rhinitis 07/08/2009  . PSA, INCREASED 07/08/2009   Past Medical History:  Diagnosis Date  . Allergy   . Arthritis    hand, neck  . Back pain 09/11/2016   patient denies back pain but has neck pain  . Bicuspid aortic valve   . DEGENERATIVE DISC DISEASE, CERVICAL SPINE   . Heart murmur    never has caused any problems  . History of chicken pox   . Hypertension   . Migraines    last one 2 wks ago- allergy related   . MITRAL VALVE PROLAPSE   . PSA, INCREASED   . TINNITUS, CHRONIC, BILATERAL     Family History  Problem Relation Age of Onset  . Colon cancer Mother 46       Deceased  . Lung cancer Mother   . Liver cancer Mother   . Breast cancer Sister   . Lymphoma Father 2       Deceased  . Diabetes Father   . Cancer Other        Paternal Grandparents  . Cancer Other        Maternal Grandparents  . Emphysema Paternal Grandfather   . Emphysema Maternal Grandfather   . Cancer Paternal Aunt   . Cancer Maternal Aunt   . Esophageal cancer Neg Hx   . Stomach cancer Neg Hx   . Rectal cancer Neg Hx     Past Surgical  History:  Procedure Laterality Date  . CERVICAL DISC SURGERY  12/2019  . CERVICAL SPINE SURGERY    . COLONOSCOPY  2012   [facility] hx polyps  . CYST REMOVAL NECK     And Face  . NASAL SEPTOPLASTY W/ TURBINOPLASTY Bilateral 09/25/2019   Procedure: NASAL SEPTOPLASTY WITH TURBINATE REDUCTION;  Surgeon: [name], MD;  Location: [place];  Service: ENT;  Laterality: Bilateral;  . POLYPECTOMY     Colon  . PROSTATE BIOPSY  06/2019   negative  . SINUS ENDO WITH FUSION Bilateral 09/25/2019   Procedure: ENDOSCOPIC SINUS SURGERY WITH FUSION NAVIGATION;  Surgeon: [name], MD;  Location: [place];  Service: ENT;  Laterality: Bilateral;  . Tonsillectome    . WISDOM TOOTH EXTRACTION     Social History   Occupational History  . Occupation: Retired  Tobacco Use  . Smoking status: Current Every Day Smoker    Packs/day: 1.00    Years: 40.00    Pack years: 40.00    Types: Cigarettes  . Smokeless tobacco: Never Used  Vaping Use  . Vaping Use: Never used  Substance and Sexual Activity  . Alcohol use: Yes    Alcohol/week: 7.0 standard drinks    Types: 7 Standard drinks or equivalent per week    Comment: 1 glass of scotch daily  . Drug use: No  . Sexual activity: Yes    Partners: Female    Comment: wife

## 2021-01-31 ENCOUNTER — Ambulatory Visit (INDEPENDENT_AMBULATORY_CARE_PROVIDER_SITE_OTHER): Payer: 59 | Admitting: Family Medicine

## 2021-01-31 ENCOUNTER — Encounter: Payer: Self-pay | Admitting: Family Medicine

## 2021-01-31 ENCOUNTER — Ambulatory Visit: Payer: Self-pay

## 2021-01-31 ENCOUNTER — Other Ambulatory Visit: Payer: Self-pay

## 2021-01-31 DIAGNOSIS — M79645 Pain in left finger(s): Secondary | ICD-10-CM | POA: Diagnosis not present

## 2021-01-31 DIAGNOSIS — M79644 Pain in right finger(s): Secondary | ICD-10-CM | POA: Diagnosis not present

## 2021-01-31 DIAGNOSIS — G8929 Other chronic pain: Secondary | ICD-10-CM

## 2021-01-31 NOTE — Progress Notes (Signed)
Office Visit Note   Patient: David Cunningham           Date of Birth: 07/25/1956           MRN: [id number] Visit Date: 01/31/2021 Requested by: Brunetta Jeans, PA-C [address] [place],  [place] [address] PCP: Delorse Limber  Subjective: Chief Complaint  Patient presents with  . Right Shoulder - Pain, Follow-up    AC joint cortisone injection per Dr. Ninfa Linden  . Right Thumb - Pain, Follow-up    Bilateral thumb cortisone injections with ultrasound-guidance per Dr. Ninfa Linden  . Left Thumb - Pain, Follow-up    HPI: He is here for bilateral thumb CMC and right AC joint injections.  Since the appointment was scheduled, his shoulder has improved significantly.  Both of his hands hurt chronically.  He is very active, he likes to play golf.  He flared up his shoulder recently by chopping down 3 trees using an ax as a form of exercise.               ROS:   All other systems were reviewed and are negative.  Objective: Vital Signs: There were no vitals taken for this visit.  Physical Exam:  General:  Alert and oriented, in no acute distress. Pulm:  Breathing unlabored. Psy:  Normal mood, congruent affect. Skin: No erythema Right shoulder: Minimal tenderness today at the [facility] joint. Hands: Positive CMC grind test in both hands, with tenderness to palpation and small joint effusions.    Imaging: US Guided Needle Placement - No Linked Charges  Result Date: 01/31/2021 Bilateral thumb CMC injections: After sterile prep with Betadine, injected 1 cc 0.25% bupivacaine and 6 mg betamethasone into the joint recess.   Assessment & Plan: 1.  Bilateral thumb CMC arthrosis -Both thumbs injected today.  He will start glucosamine and turmeric.  Follow-up as needed.  I suggested CMC unloading braces as well.  2.  Improve left AC joint pain with underlying DJD -No injection today.  He will try the above     Procedures: No procedures performed        PMFS History: Patient  Active Problem List   Diagnosis Date Noted  . Cervical spondylosis with radiculopathy 06/16/2019  . Chronic pansinusitis 04/21/2019  . Nasal turbinate hypertrophy 03/19/2019  . Nasal septal deviation 03/19/2019  . Encounter for screening for lung cancer 01/14/2018  . Right-sided thoracic back pain 09/11/2016  . Situational anxiety 09/04/2016  . Warts of foot 10/25/2015  . Visit for preventive health examination 10/06/2014  . Prostate cancer screening 10/06/2014  . Bicuspid aortic valve 09/24/2012  . Hypertension 09/24/2012  . DEGENERATIVE DISC DISEASE, CERVICAL SPINE 02/18/2010  . TOBACCO ABUSE 07/08/2009  . TINNITUS, CHRONIC, BILATERAL 07/08/2009  . Mitral valve disorder 07/08/2009  . Chronic rhinitis 07/08/2009  . PSA, INCREASED 07/08/2009   Past Medical History:  Diagnosis Date  . Allergy   . Arthritis    hand, neck  . Back pain 09/11/2016   patient denies back pain but has neck pain  . Bicuspid aortic valve   . DEGENERATIVE DISC DISEASE, CERVICAL SPINE   . Heart murmur    never has caused any problems  . History of chicken pox   . Hypertension   . Migraines    last one 2 wks ago- allergy related   . MITRAL VALVE PROLAPSE   . PSA, INCREASED   . TINNITUS, CHRONIC, BILATERAL     Family History  Problem Relation Age of Onset  . Colon cancer Mother 80       Deceased  . Lung cancer Mother   . Liver cancer Mother   . Breast cancer Sister   . Lymphoma Father 16       Deceased  . Diabetes Father   . Cancer Other        Paternal Grandparents  . Cancer Other        Maternal Grandparents  . Emphysema Paternal Grandfather   . Emphysema Maternal Grandfather   . Cancer Paternal Aunt   . Cancer Maternal Aunt   . Esophageal cancer Neg Hx   . Stomach cancer Neg Hx   . Rectal cancer Neg Hx     Past Surgical History:  Procedure Laterality Date  . CERVICAL DISC SURGERY  12/2019  . CERVICAL SPINE SURGERY    . COLONOSCOPY  2012   [facility] hx polyps  . CYST REMOVAL  NECK     And Face  . NASAL SEPTOPLASTY W/ TURBINOPLASTY Bilateral 09/25/2019   Procedure: NASAL SEPTOPLASTY WITH TURBINATE REDUCTION;  Surgeon: [name], MD;  Location: [place];  Service: ENT;  Laterality: Bilateral;  . POLYPECTOMY     Colon  . PROSTATE BIOPSY  06/2019   negative  . SINUS ENDO WITH FUSION Bilateral 09/25/2019   Procedure: ENDOSCOPIC SINUS SURGERY WITH FUSION NAVIGATION;  Surgeon: [name], MD;  Location: [place];  Service: ENT;  Laterality: Bilateral;  . Tonsillectome    . WISDOM TOOTH EXTRACTION     Social History   Occupational History  . Occupation: Retired  Tobacco Use  . Smoking status: Current Every Day Smoker    Packs/day: 1.00    Years: 40.00    Pack years: 40.00    Types: Cigarettes  . Smokeless tobacco: Never Used  Vaping Use  . Vaping Use: Never used  Substance and Sexual Activity  . Alcohol use: Yes    Alcohol/week: 7.0 standard drinks    Types: 7 Standard drinks or equivalent per week    Comment: 1 glass of scotch daily  . Drug use: No  . Sexual activity: Yes    Partners: Female    Comment: wife

## 2021-01-31 NOTE — Patient Instructions (Signed)
    Glucosamine Sulfate:  1,000 mg twice daily  Turmeric:  500 mg twice daily  "Anti-inflammatory diet" Print production planner)

## 2021-03-14 ENCOUNTER — Encounter: Payer: Self-pay | Admitting: Medical

## 2021-03-31 ENCOUNTER — Telehealth: Payer: Self-pay | Admitting: Medical

## 2021-03-31 ENCOUNTER — Encounter: Payer: Self-pay | Admitting: Medical

## 2021-03-31 ENCOUNTER — Other Ambulatory Visit: Payer: Self-pay

## 2021-03-31 ENCOUNTER — Ambulatory Visit (INDEPENDENT_AMBULATORY_CARE_PROVIDER_SITE_OTHER): Payer: 59 | Admitting: Medical

## 2021-03-31 VITALS — BP 132/82 | HR 82 | Temp 98.0°F | Resp 16 | Ht 69.5 in | Wt 163.0 lb

## 2021-03-31 DIAGNOSIS — F17200 Nicotine dependence, unspecified, uncomplicated: Secondary | ICD-10-CM

## 2021-03-31 DIAGNOSIS — Z122 Encounter for screening for malignant neoplasm of respiratory organs: Secondary | ICD-10-CM | POA: Diagnosis not present

## 2021-03-31 DIAGNOSIS — I700 Atherosclerosis of aorta: Secondary | ICD-10-CM

## 2021-03-31 DIAGNOSIS — Z1283 Encounter for screening for malignant neoplasm of skin: Secondary | ICD-10-CM

## 2021-03-31 DIAGNOSIS — J432 Centrilobular emphysema: Secondary | ICD-10-CM

## 2021-03-31 DIAGNOSIS — R252 Cramp and spasm: Secondary | ICD-10-CM

## 2021-03-31 DIAGNOSIS — Z0000 Encounter for general adult medical examination without abnormal findings: Secondary | ICD-10-CM | POA: Diagnosis not present

## 2021-03-31 DIAGNOSIS — R253 Fasciculation: Secondary | ICD-10-CM | POA: Diagnosis not present

## 2021-03-31 LAB — CBC WITH DIFFERENTIAL/PLATELET
Basophils Absolute: 0.1 10*3/uL (ref 0.0–0.1)
Basophils Relative: 1.4 % (ref 0.0–3.0)
Eosinophils Absolute: 0.4 10*3/uL (ref 0.0–0.7)
Eosinophils Relative: 4.1 % (ref 0.0–5.0)
HCT: 48.4 % (ref 39.0–52.0)
Hemoglobin: 16.5 g/dL (ref 13.0–17.0)
Lymphocytes Relative: 25.9 % (ref 12.0–46.0)
Lymphs Abs: 2.4 10*3/uL (ref 0.7–4.0)
MCHC: 34.2 g/dL (ref 30.0–36.0)
MCV: 97 fl (ref 78.0–100.0)
Monocytes Absolute: 0.7 10*3/uL (ref 0.1–1.0)
Monocytes Relative: 7.3 % (ref 3.0–12.0)
Neutro Abs: 5.7 10*3/uL (ref 1.4–7.7)
Neutrophils Relative %: 61.3 % (ref 43.0–77.0)
Platelets: 314 10*3/uL (ref 150.0–400.0)
RBC: 4.99 Mil/uL (ref 4.22–5.81)
RDW: 13.3 % (ref 11.5–15.5)
WBC: 9.2 10*3/uL (ref 4.0–10.5)

## 2021-03-31 LAB — LIPID PANEL
Cholesterol: 145 mg/dL (ref 0–200)
HDL: 63.1 mg/dL (ref >39.00)
LDL Cholesterol: 74 mg/dL (ref 0–99)
NonHDL: 82.05
Total CHOL/HDL Ratio: 2
Triglycerides: 42 mg/dL (ref 0.0–149.0)
VLDL: 8.4 mg/dL (ref 0.0–40.0)

## 2021-03-31 LAB — COMPREHENSIVE METABOLIC PANEL
ALT: 13 U/L (ref 0–53)
AST: 15 U/L (ref 0–37)
Albumin: 4.2 g/dL (ref 3.5–5.2)
Alkaline Phosphatase: 58 U/L (ref 39–117)
BUN: 16 mg/dL (ref 6–23)
CO2: 30 mEq/L (ref 19–32)
Calcium: 9.4 mg/dL (ref 8.4–10.5)
Chloride: 102 mEq/L (ref 96–112)
Creatinine, Ser: 0.9 mg/dL (ref 0.40–1.50)
GFR: 91.26 mL/min (ref >60.00)
Glucose, Bld: 100 mg/dL — ABNORMAL HIGH (ref 70–99)
Potassium: 5.4 mEq/L — ABNORMAL HIGH (ref 3.5–5.1)
Sodium: 137 mEq/L (ref 135–145)
Total Bilirubin: 0.7 mg/dL (ref 0.2–1.2)
Total Protein: 6.6 g/dL (ref 6.0–8.3)

## 2021-03-31 LAB — MAGNESIUM: Magnesium: 1.9 mg/dL (ref 1.5–2.5)

## 2021-03-31 MED ORDER — SODIUM POLYSTYRENE SULFONATE 15 GM/60ML PO SUSP: ORAL | 0 refills | Status: DC

## 2021-03-31 NOTE — Telephone Encounter (Signed)
Rx kayexalate sent to pt pharmacy.

## 2021-03-31 NOTE — Patient Instructions (Addendum)
For you wellness exam today I have ordered cbc, cmp and  lipid panel.  Vaccine up to date but consider booster covid vaccine  Recommend exercise and healthy diet.  We will let you know lab results as they come in.  Follow up date appointment will be determined after lab review.   For hx of smoking placed ct chest screening lung cancer.  Placed derm referral screening skin cancer.  On exam mild dull rt tm. Recent sinus infection. If over next 5 days sinus pain return or ear pain let me know and can prescribe azithromycin.  Some random rt upper eye lid twitch and some occasional bilateral cramps in calfs. Will follow cmp and add magnesium level.   Preventive Care 19-35 Years Old, Male Preventive care refers to lifestyle choices and visits with your health care provider that can promote health and wellness. This includes:  A yearly physical exam. This is also called an annual wellness visit.  Regular dental and eye exams.  Immunizations.  Screening for certain conditions.  Healthy lifestyle choices, such as: ? Eating a healthy diet. ? Getting regular exercise. ? Not using drugs or products that contain nicotine and tobacco. ? Limiting alcohol use. What can I expect for my preventive care visit? Physical exam Your health care provider will check your:  Height and weight. These may be used to calculate your BMI (body mass index). BMI is a measurement that tells if you are at a healthy weight.  Heart rate and blood pressure.  Body temperature.  Skin for abnormal spots. Counseling Your health care provider may ask you questions about your:  Past medical problems.  Family's medical history.  Alcohol, tobacco, and drug use.  Emotional well-being.  Home life and relationship well-being.  Sexual activity.  Diet, exercise, and sleep habits.  Work and work Statistician.  Access to firearms. What immunizations do I need? Vaccines are usually given at various ages,  according to a schedule. Your health care provider will recommend vaccines for you based on your age, medical history, and lifestyle or other factors, such as travel or where you work.   What tests do I need? Blood tests  Lipid and cholesterol levels. These may be checked every 5 years, or more often if you are over 46 years old.  Hepatitis C test.  Hepatitis B test. Screening  Lung cancer screening. You may have this screening every year starting at age 59 if you have a 30-pack-year history of smoking and currently smoke or have quit within the past 15 years.  Prostate cancer screening. Recommendations will vary depending on your family history and other risks.  Genital exam to check for testicular cancer or hernias.  Colorectal cancer screening. ? All adults should have this screening starting at age 87 and continuing until age 46. ? Your health care provider may recommend screening at age 33 if you are at increased risk. ? You will have tests every 1-10 years, depending on your results and the type of screening test.  Diabetes screening. ? This is done by checking your blood sugar (glucose) after you have not eaten for a while (fasting). ? You may have this done every 1-3 years.  STD (sexually transmitted disease) testing, if you are at risk. Follow these instructions at home: Eating and drinking  Eat a diet that includes fresh fruits and vegetables, whole grains, lean protein, and low-fat dairy products.  Take vitamin and mineral supplements as recommended by your health care provider.  Do  not drink alcohol if your health care provider tells you not to drink.  If you drink alcohol: ? Limit how much you have to 0-2 drinks a day. ? Be aware of how much alcohol is in your drink. In the [place] one drink equals one 12 oz bottle of beer (355 mL), one 5 oz glass of wine (148 mL), or one 1 oz glass of hard liquor (44 mL).   Lifestyle  Take daily care of your teeth and gums. Brush  your teeth every morning and night with fluoride toothpaste. Floss one time each day.  Stay active. Exercise for at least 30 minutes 5 or more days each week.  Do not use any products that contain nicotine or tobacco, such as cigarettes, e-cigarettes, and chewing tobacco. If you need help quitting, ask your health care provider.  Do not use drugs.  If you are sexually active, practice safe sex. Use a condom or other form of protection to prevent STIs (sexually transmitted infections).  If told by your health care provider, take low-dose aspirin daily starting at age 69.  Find healthy ways to cope with stress, such as: ? Meditation, yoga, or listening to music. ? Journaling. ? Talking to a trusted person. ? Spending time with friends and family. Safety  Always wear your seat belt while driving or riding in a vehicle.  Do not drive: ? If you have been drinking alcohol. Do not ride with someone who has been drinking. ? When you are tired or distracted. ? While texting.  Wear a helmet and other protective equipment during sports activities.  If you have firearms in your house, make sure you follow all gun safety procedures. What's next?  Go to your health care provider once a year for an annual wellness visit.  Ask your health care provider how often you should have your eyes and teeth checked.  Stay up to date on all vaccines. This information is not intended to replace advice given to you by your health care provider. Make sure you discuss any questions you have with your health care provider. Document Revised: 07/22/2019 Document Reviewed: 10/17/2018 Elsevier Patient Education  2021 [employer].

## 2021-03-31 NOTE — Progress Notes (Signed)
Subjective:    Patient ID: David Cunningham, male    DOB: [birth year], 65 y.o.   MRN: [id number]  HPI  Pt in for first time with me.  Pt is fasting. Pt retired Brewing technologist. Pt tries to go to gym at least twice a week and also very active. Pt states eating healthy. Smoke pack a day for 40 years. 2 shots of scotch at night. 1 cup of coffee a day.  Pt had 2 covid vaccines. No booster yet.  Pt went to urologist. They are following his psa. Bx done in 2021. Psa is coming down. Hx of increasing velocity at one point up to 20.  Pt sees cardiologist. Pt states may need valve replaced in next 4-5 years.  1. Left ventricular ejection fraction, by estimation, is 60 to 65%. The left ventricle has normal function. The left ventricle has no regional wall motion abnormalities. The left ventricular internal cavity size was mildly dilated. Left ventricular diastolic parameters are consistent with Grade II diastolic dysfunction (pseudonormalization). 2. Right ventricular systolic function is normal. The right ventricular size is normal. 3. Left atrial size was mildly dilated. 4. The mitral valve is myxomatous. Mild mitral valve regurgitation. No evidence of mitral stenosis. 5. The aortic valve is bicuspid. Aortic valve regurgitation is moderate to severe. Moderate aortic valve stenosis. Aortic regurgitation PHT measures 402 msec. Aortic valve mean gradient measures 20.5 mmHg. Aortic valve Vmax measures 3.10 m/s. 6. Aortic dilatation noted. There is mild dilatation of the ascending aorta measuring 41 mm. 7. The inferior vena cava is normal in size with greater than 50% respiratory variability, suggesting right atrial pressure of 3 mmHg.   Pt has htn. bp well controlled on losartan 50 mg daily.  Hx of allergies. Takes zyrtec and flonase.   Recent sinus infection. He just finished amoxicillin. Pt states teladoc.     Review of Systems  Constitutional: Negative for chills, fatigue  and fever.  HENT: Positive for congestion. Negative for ear discharge, ear pain and facial swelling.        Residual nasal congestion.  Respiratory: Positive for cough. Negative for choking, chest tightness, shortness of breath and wheezing.        Residual cough.  Cardiovascular: Negative for chest pain and palpitations.  Gastrointestinal: Negative for abdominal pain.  Genitourinary: Negative for dysuria and frequency.  Musculoskeletal: Negative for back pain.  Skin: Negative for rash.  Neurological: Negative for dizziness, syncope, speech difficulty, weakness, numbness and headaches.       Recent mild rt eye lid twitch on and off last couple of month. Also gets some leg cramps in calfs. Occurs at night.  Psychiatric/Behavioral: Negative for behavioral problems and confusion.     Past Medical History:  Diagnosis Date  . Allergy   . Arthritis    hand, neck  . Back pain 09/11/2016   patient denies back pain but has neck pain  . Bicuspid aortic valve   . DEGENERATIVE DISC DISEASE, CERVICAL SPINE   . Heart murmur    never has caused any problems  . History of chicken pox   . Hypertension   . Migraines    last one 2 wks ago- allergy related   . MITRAL VALVE PROLAPSE   . PSA, INCREASED   . TINNITUS, CHRONIC, BILATERAL      Social History   Socioeconomic History  . Marital status: Married    Spouse name: Not on file  . Number of children: Not on file  .  Years of education: Not on file  . Highest education level: Not on file  Occupational History  . Occupation: Retired  Tobacco Use  . Smoking status: Current Every Day Smoker    Packs/day: 1.00    Years: 40.00    Pack years: 40.00    Types: Cigarettes  . Smokeless tobacco: Never Used  Vaping Use  . Vaping Use: Never used  Substance and Sexual Activity  . Alcohol use: Yes    Alcohol/week: 7.0 standard drinks    Types: 7 Standard drinks or equivalent per week    Comment: 1 glass of scotch daily  . Drug use: No  .  Sexual activity: Yes    Partners: Female    Comment: wife  Other Topics Concern  . Not on file  Social History Narrative  . Not on file   Social Determinants of Health   Financial Resource Strain: Not on file  Food Insecurity: Not on file  Transportation Needs: Not on file  Physical Activity: Not on file  Stress: Not on file  Social Connections: Not on file  Intimate Partner Violence: Not on file    Past Surgical History:  Procedure Laterality Date  . CERVICAL DISC SURGERY  12/2019  . CERVICAL SPINE SURGERY    . COLONOSCOPY  2012   [place] hx polyps  . CYST REMOVAL NECK     And Face  . NASAL SEPTOPLASTY W/ TURBINOPLASTY Bilateral 09/25/2019   Procedure: NASAL SEPTOPLASTY WITH TURBINATE REDUCTION;  Surgeon: [name], MD;  Location: [facility];  Service: ENT;  Laterality: Bilateral;  . POLYPECTOMY     Colon  . PROSTATE BIOPSY  06/2019   negative  . SINUS ENDO WITH FUSION Bilateral 09/25/2019   Procedure: ENDOSCOPIC SINUS SURGERY WITH FUSION NAVIGATION;  Surgeon: [name], MD;  Location: [facility];  Service: ENT;  Laterality: Bilateral;  . Tonsillectome    . WISDOM TOOTH EXTRACTION      Family History  Problem Relation Age of Onset  . Colon cancer Mother 32       Deceased  . Lung cancer Mother   . Liver cancer Mother   . Breast cancer Sister   . Lymphoma Father 11       Deceased  . Diabetes Father   . Cancer Other        Paternal Grandparents  . Cancer Other        Maternal Grandparents  . Emphysema Paternal Grandfather   . Emphysema Maternal Grandfather   . Cancer Paternal Aunt   . Cancer Maternal Aunt   . Esophageal cancer Neg Hx   . Stomach cancer Neg Hx   . Rectal cancer Neg Hx     No Known Allergies  Current Outpatient Medications on File Prior to Visit  Medication Sig Dispense Refill  . cetirizine (ZYRTEC) 5 MG tablet Take 10 mg by mouth daily.    . diclofenac Sodium (VOLTAREN) 1 % GEL Apply 2 g  topically 4 (four) times daily. 100 g 1  . fluticasone (FLONASE) 50 MCG/ACT nasal spray Place 2 sprays into both nostrils daily. 16 g 2  . losartan (COZAAR) 50 MG tablet Take 1 tablet (50 mg total) by mouth daily. 90 tablet 3  . naproxen sodium (ALEVE) 220 MG tablet Take 220 mg by mouth 2 (two) times daily as needed.    . sodium chloride (OCEAN) 0.65 % SOLN nasal spray Place 1 spray into both nostrils as needed for congestion.    Marland Kitchen  TURMERIC PO Take by mouth.     No current facility-administered medications on file prior to visit.    BP 132/82 (BP Location: Left Arm, Patient Position: Sitting, Cuff Size: Normal)   Pulse 82   Temp 98 F (36.7 C)   Resp 16   Ht 5' 9.5" (1.765 m)   Wt 163 lb (73.9 kg)   SpO2 98%   BMI 23.73 kg/m       Objective:   Physical Exam   General Mental Status- Alert. General Appearance- Not in acute distress.   Skin Small scattered moles moderate in number anterior and posterior thorax Rt upper back moderate sized mole borderline in size.  Neck Carotid Arteries- Normal color. Moisture- Normal Moisture. No carotid bruits. No JVD.  Chest and Lung Exam Auscultation: Breath Sounds:-Normal.  Cardiovascular Auscultation:Rythm- Regular. Murmurs & Other Heart Sounds:Auscultation of the heart reveals- No Murmurs.  Abdomen Inspection:-Inspeection Normal. Palpation/Percussion:Note:No mass. Palpation and Percussion of the abdomen reveal- Non Tender, Non Distended + BS, no rebound or guarding.    Neurologic Cranial Nerve exam:- CN III-XII intact(No nystagmus), symmetric smile. Strength:- 5/5 equal and symmetric strength both upper and lower extremities.   heent- no sinus pressure, canals clear. Rt tm mild faint dull. Small red area center     Assessment & Plan:  For you wellness exam today I have ordered cbc, cmp and  lipid panel.  Vaccine up to date but consider booster covid vaccine  Recommend exercise and healthy diet.  We will let you know  lab results as they come in.  Follow up date appointment will be determined after lab review.   For hx of smoking placed ct chest screening lung cancer.  Placed derm referral screening skin cancer.  On exam mild dull rt tm. Recent sinus infection. If over next 5 days sinus pain return or ear pain let me know and can prescribe azithromycin.  Mackie Pai, [place]    [id number] charge as did address discuss recent sinus infection, rt tm appearance and addressed/discussed eye lid twitch concern as well as muscle cramps.

## 2021-04-01 ENCOUNTER — Encounter: Payer: Self-pay | Admitting: Medical

## 2021-04-01 ENCOUNTER — Telehealth: Payer: Self-pay | Admitting: Medical

## 2021-04-01 MED ORDER — SODIUM POLYSTYRENE SULFONATE 15 GM/60ML PO SUSP: ORAL | 0 refills | Status: DC

## 2021-04-01 NOTE — Telephone Encounter (Signed)
Rx kayexalate sent to [facility].

## 2021-04-09 ENCOUNTER — Telehealth: Payer: Self-pay | Admitting: Medical

## 2021-04-09 DIAGNOSIS — E875 Hyperkalemia: Secondary | ICD-10-CM

## 2021-04-09 NOTE — Telephone Encounter (Signed)
Future cmp placed. 

## 2021-04-09 NOTE — Telephone Encounter (Signed)
One day this week will you help me contact [facility]. I want to ask them about there denials for screening ct chest in pt 60 and above with 30 + pack per day smoking. Recently they denied this pt with 40 year pack a day history of smoking.    If you would get them on the phone and ask them directly. Get me if they officially want me to ask officially. This will save me time as sometimes I wait 30 minutes or so on the phone.   Coordinate with me and I will give you denial sheet explanation with number to call.

## 2021-04-13 NOTE — Telephone Encounter (Signed)
Working on Astronomer (Based on reviewing it could have been when doing referral not enough info was put in)  Base on your review does he have any symptoms or any health problem (the last two on the list):  This is their criteria:  Annual low-dose CT is indicated when ALL of the following criteria are met: Age equal to or greater than 92 and less than or equal to 43 20 or greater pack-year history* of cigarette smoking or established asbestosis-related lung disease Current smoker or quit date within the past 15 years No signs or symptoms suggestive of underlying cancer No health problems that would be expected to substantially limit life expectancy or the ability to undergo an intervention with curative intent

## 2021-04-13 NOTE — Addendum Note (Signed)
Addended by: Kem Boroughs D on: 04/13/2021 11:48 AM   Modules accepted: Orders

## 2021-04-13 NOTE — Telephone Encounter (Signed)
Bringing it over now.

## 2021-04-13 NOTE — Telephone Encounter (Signed)
Can you please give me the denial when you get a chance.

## 2021-04-15 DIAGNOSIS — N419 Inflammatory disease of prostate, unspecified: Secondary | ICD-10-CM | POA: Insufficient documentation

## 2021-04-19 ENCOUNTER — Telehealth: Payer: Self-pay | Admitting: *Deleted

## 2021-04-19 ENCOUNTER — Encounter: Payer: Self-pay | Admitting: *Deleted

## 2021-04-19 NOTE — Telephone Encounter (Signed)
We are working on appeal letter for CT Low dose lung cancer screening.  Patient will need to sign so we can submit appeal letter on his behalf.   Left message on machine for patient to come by and sign.  Paper is on [employer].

## 2021-04-20 NOTE — Telephone Encounter (Signed)
Left message on machine to come by office to sign appeal request form.

## 2021-04-20 NOTE — Telephone Encounter (Signed)
FYI: Patient is out of town..  he will be back on Friday.

## 2021-04-26 ENCOUNTER — Other Ambulatory Visit: Payer: Self-pay

## 2021-04-26 ENCOUNTER — Other Ambulatory Visit (INDEPENDENT_AMBULATORY_CARE_PROVIDER_SITE_OTHER): Payer: 59

## 2021-04-26 DIAGNOSIS — E875 Hyperkalemia: Secondary | ICD-10-CM

## 2021-04-26 LAB — COMPREHENSIVE METABOLIC PANEL
ALT: 19 U/L (ref 0–53)
AST: 22 U/L (ref 0–37)
Albumin: 4.4 g/dL (ref 3.5–5.2)
Alkaline Phosphatase: 55 U/L (ref 39–117)
BUN: 21 mg/dL (ref 6–23)
CO2: 28 mEq/L (ref 19–32)
Calcium: 9.3 mg/dL (ref 8.4–10.5)
Chloride: 104 mEq/L (ref 96–112)
Creatinine, Ser: 0.97 mg/dL (ref 0.40–1.50)
GFR: 83.38 mL/min (ref >60.00)
Glucose, Bld: 87 mg/dL (ref 70–99)
Potassium: 4.7 mEq/L (ref 3.5–5.1)
Sodium: 140 mEq/L (ref 135–145)
Total Bilirubin: 0.4 mg/dL (ref 0.2–1.2)
Total Protein: 6.7 g/dL (ref 6.0–8.3)

## 2021-05-16 ENCOUNTER — Ambulatory Visit (HOSPITAL_COMMUNITY): Payer: 59 | Attending: Cardiovascular Disease

## 2021-05-16 ENCOUNTER — Other Ambulatory Visit: Payer: Self-pay

## 2021-05-16 DIAGNOSIS — I350 Nonrheumatic aortic (valve) stenosis: Secondary | ICD-10-CM

## 2021-05-16 LAB — ECHOCARDIOGRAM COMPLETE
AR max vel: 1.76 cm2
AV Area VTI: 1.64 cm2
AV Area mean vel: 1.64 cm2
AV Mean grad: 25 mmHg
AV Peak grad: 42.3 mmHg
Ao pk vel: 3.25 m/s
Area-P 1/2: 3.12 cm2
MV M vel: 4.97 m/s
MV Peak grad: 98.8 mmHg
P 1/2 time: 394 msec
Radius: 0.5 cm
S' Lateral: 3.6 cm

## 2021-07-14 ENCOUNTER — Encounter: Payer: Self-pay | Admitting: Medical

## 2021-07-28 ENCOUNTER — Other Ambulatory Visit (HOSPITAL_BASED_OUTPATIENT_CLINIC_OR_DEPARTMENT_OTHER): Payer: Self-pay | Admitting: Medical

## 2021-07-28 DIAGNOSIS — Z139 Encounter for screening, unspecified: Secondary | ICD-10-CM

## 2021-08-02 ENCOUNTER — Other Ambulatory Visit: Payer: Self-pay

## 2021-08-02 ENCOUNTER — Ambulatory Visit: Payer: 59 | Attending: Internal Medicine

## 2021-08-02 ENCOUNTER — Ambulatory Visit (HOSPITAL_BASED_OUTPATIENT_CLINIC_OR_DEPARTMENT_OTHER)
Admission: RE | Admit: 2021-08-02 | Discharge: 2021-08-02 | Disposition: A | Discharge status: Home / Self Care | Payer: 59 | Source: Ambulatory Visit | Attending: Medical | Admitting: Medical

## 2021-08-02 DIAGNOSIS — Z139 Encounter for screening, unspecified: Secondary | ICD-10-CM | POA: Insufficient documentation

## 2021-08-02 DIAGNOSIS — Z23 Encounter for immunization: Secondary | ICD-10-CM

## 2021-08-02 NOTE — Progress Notes (Signed)
   Covid-19 Vaccination Clinic  Name:  Shawndale Kilpatrick    MRN: [id number] DOB: Jun 09, 1956  08/02/2021  Mr. Tippen was observed post Covid-19 immunization for 15 minutes without incident. He was provided with Vaccine Information Sheet and instruction to access the V-Safe system.   Mr. Colver was instructed to call 911 with any severe reactions post vaccine: Difficulty breathing  Swelling of face and throat  A fast heartbeat  A bad rash all over body  Dizziness and weakness

## 2021-08-03 ENCOUNTER — Encounter: Payer: Self-pay | Admitting: Medical

## 2021-08-03 MED ORDER — AZITHROMYCIN 250 MG PO TABS: ORAL_TABLET | ORAL | 0 refills | Status: AC

## 2021-08-03 MED ORDER — ATORVASTATIN CALCIUM 10 MG PO TABS: 10.0000 mg | ORAL_TABLET | Freq: Every day | ORAL | 3 refills | Status: DC

## 2021-08-03 NOTE — Addendum Note (Signed)
Addended by: Anabel Halon on: 08/03/2021 01:00 PM   Modules accepted: Orders

## 2021-08-03 NOTE — Addendum Note (Signed)
Addended by: Anabel Halon on: 08/03/2021 01:03 PM   Modules accepted: Orders

## 2021-08-09 ENCOUNTER — Other Ambulatory Visit (HOSPITAL_BASED_OUTPATIENT_CLINIC_OR_DEPARTMENT_OTHER): Payer: Self-pay

## 2021-08-09 MED ORDER — COVID-19MRNA BIVAL VACC PFIZER 30 MCG/0.3ML IM SUSP
INTRAMUSCULAR | 0 refills | Status: DC
  Filled 2021-08-09: qty 0.3, 1d supply, fill #0

## 2021-08-12 ENCOUNTER — Other Ambulatory Visit (HOSPITAL_BASED_OUTPATIENT_CLINIC_OR_DEPARTMENT_OTHER): Payer: Self-pay

## 2021-08-12 MED ORDER — COVID-19MRNA BIVAL VACC PFIZER 30 MCG/0.3ML IM SUSP
INTRAMUSCULAR | 0 refills | Status: DC
  Filled 2021-08-12: qty 0.3, 1d supply, fill #0

## 2021-10-05 NOTE — Progress Notes (Signed)
Date:  10/05/2021   ID:  David Cunningham, DOB 1955-11-14, MRN [id number]   Provider Location: Office  PCP:  [name]  Cardiologist:  [name] Electrophysiologist:  None   Evaluation Performed:  Follow-Up Visit In person   Chief Complaint:  AV Disease  History of Present Illness:     65 y.o. f/u for bicuspid AV disease. No family history of cardiac disease. No history of CAD or chest pain.  Poor diet with excess salt, smokes and has scotch most nights. Normal ETT 10/09/12 with HTN response.  Has had some progression of his AS/AR by TTE 05/16/21 see impressions below   Cardiac CTA 05/25/20 calcium score 22 isolated to LAD only 27 th percentile for age/sex. Left dominant with no obstructive disease CAD RADS 1 AV calcium score 534 bicuspid valve fused right and left cusps Ascending aortic root 4.2 cm    CT head HP 02/2015 no aneurysm normal carotid system   Dentition in good shape  Youngest son graduated from [employer] And is home now  Step daughter in [place] and oldest son in [place]   He feels great retired this year from [employer] received COVID vaccine  09/25/19 Nasal septoplasty/turbinate reduction [facility] Rx with Augmentin for bacterial sinusitis 03/12/20   Had COVID a month ago after returning from [place]     The patient does not have symptoms concerning for COVID-19 infection (fever, chills, cough, or new shortness of breath).    Past Medical History:  Diagnosis Date   Allergy    Arthritis    hand, neck   Back pain 09/11/2016   patient denies back pain but has neck pain   Bicuspid aortic valve    DEGENERATIVE DISC DISEASE, CERVICAL SPINE    Heart murmur    never has caused any problems   History of chicken pox    Hypertension    Migraines    last one 2 wks ago- allergy related    MITRAL VALVE PROLAPSE    PSA, INCREASED    TINNITUS, CHRONIC, BILATERAL    Past Surgical History:  Procedure Laterality Date   CERVICAL  DISC SURGERY  12/2019   CERVICAL SPINE SURGERY     COLONOSCOPY  2012   [facility] hx polyps   CYST REMOVAL NECK     And Face   NASAL SEPTOPLASTY W/ TURBINOPLASTY Bilateral 09/25/2019   Procedure: NASAL SEPTOPLASTY WITH TURBINATE REDUCTION;  Surgeon: [name], MD;  Location: [facility];  Service: ENT;  Laterality: Bilateral;   POLYPECTOMY     Colon   PROSTATE BIOPSY  06/2019   negative   SINUS ENDO WITH FUSION Bilateral 09/25/2019   Procedure: ENDOSCOPIC SINUS SURGERY WITH FUSION NAVIGATION;  Surgeon: [name], MD;  Location: [facility];  Service: ENT;  Laterality: Bilateral;   Tonsillectome     WISDOM TOOTH EXTRACTION       No outpatient medications have been marked as taking for the 10/11/21 encounter (Appointment) with Josue Hector, MD.     Allergies:   Patient has no known allergies.   Social History   Tobacco Use   Smoking status: Every Day    Packs/day: 1.00    Years: 40.00    Pack years: 40.00    Types: Cigarettes   Smokeless tobacco: Never  Vaping Use   Vaping Use: Never used  Substance Use Topics   Alcohol use: Yes    Alcohol/week: 7.0 standard drinks  Types: 7 Standard drinks or equivalent per week    Comment: 1 glass of scotch daily   Drug use: No     Family Hx: The patient's family history includes Breast cancer in his sister; Cancer in his maternal aunt, paternal aunt, and other family members; Colon cancer (age of onset: 65) in his mother; Diabetes in his father; Emphysema in his maternal grandfather and paternal grandfather; Liver cancer in his mother; Lung cancer in his mother; Lymphoma (age of onset: 71) in his father. There is no history of Esophageal cancer, Stomach cancer, or Rectal cancer.  ROS:   Please see the history of present illness.     All other systems reviewed and are negative.   Prior CV studies:   The following studies were reviewed today:  Echo 03/10/19 compared to ones done 03/30/17  and 03/13/18   Labs/Other Tests and Data Reviewed:    EKG:   10/11/2021 NSR rate 59 normal   Recent Labs: 03/31/2021: Hemoglobin 16.5; Magnesium 1.9; Platelets 314.0 04/26/2021: ALT 19; BUN 21; Creatinine, Ser 0.97; Potassium 4.7; Sodium 140   Recent Lipid Panel Lab Results  Component Value Date/Time   CHOL 145 03/31/2021 09:55 AM   TRIG 42.0 03/31/2021 09:55 AM   HDL 63.10 03/31/2021 09:55 AM   CHOLHDL 2 03/31/2021 09:55 AM   LDLCALC 74 03/31/2021 09:55 AM    Wt Readings from Last 3 Encounters:  03/31/21 163 lb (73.9 kg)  11/19/20 164 lb 9.6 oz (74.7 kg)  04/28/20 164 lb (74.4 kg)     Objective:    Vital Signs:  There were no vitals taken for this visit.   Affect appropriate Healthy:  appears stated age 66: normal Neck supple with no adenopathy JVP normal no bruits no thyromegaly Lungs clear with no wheezing and good diaphragmatic motion Heart:  S1/S2 AS/AR murmur, no rub, gallop or click PMI normal Abdomen: benighn, BS positve, no tenderness, no AAA no bruit.  No HSM or HJR Distal pulses intact with no bruits No edema Neuro non-focal Skin warm and dry No muscular weakness   ASSESSMENT & PLAN:    Bicuspid AV:  EF normal moderate AS with higher gradients by TTE 05/16/21 mean 25 peak 42 mmHg AVA 1.64 cm2  DVI 0.31 F/u echo in a year Ascending aorta 4.1 cm  Allergies:  Continue zyrtec consider flonase  F/u primary  ETOH:  Encouraged more moderation in intake labs/LFTls with primary Smoking:  Counseled on smoking cessation for less than 10 minutes Has had script for Chantix Assess lung fields on CT see above  HTN:  Improved on higher dose Cozaar  ENT:  Post septoplasty had some sinusitis despite surgery f/u ENT [facility]  COVID-19 Education: The signs and symptoms of COVID-19 were discussed with the patient and how to seek care for testing (follow up with PCP or arrange E-visit).  The importance of social distancing was discussed today.  Medication  Adjustments/Labs and Tests Ordered: Current medicines are reviewed at length with the patient today.  Concerns regarding medicines are outlined above.   Tests Ordered:  TTE July 2023    Medication Changes: No orders of the defined types were placed in this encounter.   Disposition:  Follow up  In a year   Signed, Jenkins Rouge, MD  10/05/2021 11:53 AM    [place]

## 2021-10-10 ENCOUNTER — Other Ambulatory Visit: Payer: Self-pay

## 2021-10-10 ENCOUNTER — Ambulatory Visit (INDEPENDENT_AMBULATORY_CARE_PROVIDER_SITE_OTHER): Payer: 59 | Admitting: Dermatology

## 2021-10-10 ENCOUNTER — Encounter: Payer: Self-pay | Admitting: Dermatology

## 2021-10-10 DIAGNOSIS — L851 Acquired keratosis [keratoderma] palmaris et plantaris: Secondary | ICD-10-CM | POA: Diagnosis not present

## 2021-10-10 DIAGNOSIS — Z1283 Encounter for screening for malignant neoplasm of skin: Secondary | ICD-10-CM

## 2021-10-10 DIAGNOSIS — L821 Other seborrheic keratosis: Secondary | ICD-10-CM

## 2021-10-10 DIAGNOSIS — L820 Inflamed seborrheic keratosis: Secondary | ICD-10-CM | POA: Diagnosis not present

## 2021-10-10 DIAGNOSIS — D485 Neoplasm of uncertain behavior of skin: Secondary | ICD-10-CM

## 2021-10-10 DIAGNOSIS — D1801 Hemangioma of skin and subcutaneous tissue: Secondary | ICD-10-CM

## 2021-10-10 NOTE — Patient Instructions (Signed)

## 2021-10-11 ENCOUNTER — Ambulatory Visit (INDEPENDENT_AMBULATORY_CARE_PROVIDER_SITE_OTHER): Payer: 59 | Admitting: Cardiovascular Disease

## 2021-10-11 ENCOUNTER — Encounter: Payer: Self-pay | Admitting: Cardiovascular Disease

## 2021-10-11 VITALS — BP 156/62 | HR 59 | Ht 69.0 in | Wt 166.4 lb

## 2021-10-11 DIAGNOSIS — I712 Thoracic aortic aneurysm, without rupture, unspecified: Secondary | ICD-10-CM | POA: Diagnosis not present

## 2021-10-11 DIAGNOSIS — I350 Nonrheumatic aortic (valve) stenosis: Secondary | ICD-10-CM | POA: Diagnosis not present

## 2021-10-11 DIAGNOSIS — Q231 Congenital insufficiency of aortic valve: Secondary | ICD-10-CM

## 2021-10-11 MED ORDER — LOSARTAN POTASSIUM 50 MG PO TABS: 50.0000 mg | ORAL_TABLET | Freq: Every day | ORAL | 3 refills | Status: DC

## 2021-10-11 NOTE — Patient Instructions (Addendum)
Medication Instructions:  Your physician recommends that you continue on your current medications as directed. Please refer to the Current Medication list given to you today.  Refill of Cozaar sent in to pharmacy *If you need a refill on your cardiac medications before your next appointment, please call your pharmacy*   Lab Work: NONE If you have labs (blood work) drawn today and your tests are completely normal, you will receive your results only by: [place] (if you have MyChart) OR A paper copy in the mail If you have any lab test that is abnormal or we need to change your treatment, we will call you to review the results.   Testing/Procedures: Your physician has requested that you have an echocardiogram July 2023. Echocardiography is a painless test that uses sound waves to create images of your heart. It provides your doctor with information about the size and shape of your heart and how well your heart's chambers and valves are working. This procedure takes approximately one hour. There are no restrictions for this procedure.    Follow-Up: At [facility], you and your health needs are our priority.  As part of our continuing mission to provide you with exceptional heart care, we have created designated Provider Care Teams.  These Care Teams include your primary Cardiologist (physician) and Advanced Practice Providers (APPs -  Physician Assistants and Nurse Practitioners) who all work together to provide you with the care you need, when you need it.    Your next appointment:   7 months same day as Echo   The format for your next appointment:   In Person  Provider:   Jenkins Rouge, MD {

## 2021-10-26 ENCOUNTER — Encounter: Payer: Self-pay | Admitting: Dermatology

## 2021-10-26 NOTE — Progress Notes (Signed)
° °  New Patient   Subjective  David Cunningham is a 65 y.o. male who presents for the following: Annual Exam (Itchy spot on back- other than that no concerns -).  General skin examination, several areas of concern Location:  Duration:  Quality:  Associated Signs/Symptoms: Modifying Factors:  Severity:  Timing: Context:    The following portions of the chart were reviewed this encounter and updated as appropriate:  Tobacco   Allergies   Meds   Problems   Med Hx   Surg Hx   Fam Hx       Objective  Well appearing patient in no apparent distress; mood and affect are within normal limits. Torso - Posterior (Back) Full body skin examination: No atypical moles.  1 spot on right back with historical change will be biopsied.  Right Upper Back Several noninflamed flattopped and brown textured papules  Left Hand - Posterior, Right Hand - Posterior, Right Lower Leg - Anterior Slight swelling textured saline millimeters lesions  Right Upper Back  Bi-Chromic irregular slightly inflamed crust, dermoscopy favors irritated SK over pigmented bowens       Left Chin Bilateral colored smooth 3 mm dermal papule, dermoscopy typical    A full examination was performed including scalp, head, eyes, ears, nose, lips, neck, chest, axillae, abdomen, back, buttocks, bilateral upper extremities, bilateral lower extremities, hands, feet, fingers, toes, fingernails, and toenails. All findings within normal limits unless otherwise noted below.   Assessment & Plan  Encounter for screening for malignant neoplasm of skin Torso - Posterior (Back)  Seborrheic keratosis Right Upper Back  Leave if stable  Stucco keratosis (3) Left Hand - Posterior; Right Hand - Posterior; Right Lower Leg - Anterior  No intervention necessary  Neoplasm of uncertain behavior of skin Right Upper Back  Skin / nail biopsy Type of biopsy: tangential   Informed consent: discussed and consent obtained   Timeout: patient  name, date of birth, surgical site, and procedure verified   Procedure prep:  Patient was prepped and draped in usual sterile fashion (Non sterile) Prep type:  Chlorhexidine Anesthesia: the lesion was anesthetized in a standard fashion   Anesthetic:  1% lidocaine w/ epinephrine 1-100,000 local infiltration Instrument used: flexible razor blade   Hemostasis achieved with: ferric subsulfate and electrodesiccation   Outcome: patient tolerated procedure well   Post-procedure details: sterile dressing applied and wound care instructions given   Dressing type: bandage and petrolatum    Specimen 1 - Surgical pathology Differential Diagnosis: R/O ISK - cautery after biopsy  Check Margins: No  Hemangioma of skin Left Chin  Leave stable

## 2022-03-03 ENCOUNTER — Other Ambulatory Visit: Payer: Self-pay

## 2022-03-03 ENCOUNTER — Encounter: Payer: Self-pay | Admitting: Cardiovascular Disease

## 2022-03-03 MED ORDER — FLUTICASONE PROPIONATE 50 MCG/ACT NA SUSP: 2.0000 | Freq: Every day | NASAL | 2 refills | Status: DC

## 2022-04-04 ENCOUNTER — Encounter: Payer: Self-pay | Admitting: Medical

## 2022-04-04 ENCOUNTER — Encounter: Payer: Self-pay | Admitting: Cardiovascular Disease

## 2022-04-04 ENCOUNTER — Ambulatory Visit (INDEPENDENT_AMBULATORY_CARE_PROVIDER_SITE_OTHER): Payer: 59 | Admitting: Medical

## 2022-04-04 VITALS — BP 148/80 | HR 62 | Temp 97.8°F | Resp 16 | Ht 69.0 in | Wt 166.8 lb

## 2022-04-04 DIAGNOSIS — M180 Bilateral primary osteoarthritis of first carpometacarpal joints: Secondary | ICD-10-CM

## 2022-04-04 DIAGNOSIS — I7122 Aneurysm of the aortic arch, without rupture: Secondary | ICD-10-CM | POA: Diagnosis not present

## 2022-04-04 DIAGNOSIS — Z0000 Encounter for general adult medical examination without abnormal findings: Secondary | ICD-10-CM | POA: Diagnosis not present

## 2022-04-04 DIAGNOSIS — Z23 Encounter for immunization: Secondary | ICD-10-CM

## 2022-04-04 DIAGNOSIS — I10 Essential (primary) hypertension: Secondary | ICD-10-CM | POA: Diagnosis not present

## 2022-04-04 DIAGNOSIS — R972 Elevated prostate specific antigen [PSA]: Secondary | ICD-10-CM | POA: Diagnosis not present

## 2022-04-04 LAB — COMPREHENSIVE METABOLIC PANEL
ALT: 21 U/L (ref 0–53)
AST: 24 U/L (ref 0–37)
Albumin: 4.5 g/dL (ref 3.5–5.2)
Alkaline Phosphatase: 51 U/L (ref 39–117)
BUN: 17 mg/dL (ref 6–23)
CO2: 30 mEq/L (ref 19–32)
Calcium: 9.4 mg/dL (ref 8.4–10.5)
Chloride: 103 mEq/L (ref 96–112)
Creatinine, Ser: 0.98 mg/dL (ref 0.40–1.50)
GFR: 81.81 mL/min (ref >60.00)
Glucose, Bld: 94 mg/dL (ref 70–99)
Potassium: 5.1 mEq/L (ref 3.5–5.1)
Sodium: 139 mEq/L (ref 135–145)
Total Bilirubin: 0.9 mg/dL (ref 0.2–1.2)
Total Protein: 6.7 g/dL (ref 6.0–8.3)

## 2022-04-04 LAB — LIPID PANEL
Cholesterol: 162 mg/dL (ref 0–200)
HDL: 84.4 mg/dL (ref >39.00)
LDL Cholesterol: 70 mg/dL (ref 0–99)
NonHDL: 77.21
Total CHOL/HDL Ratio: 2
Triglycerides: 38 mg/dL (ref 0.0–149.0)
VLDL: 7.6 mg/dL (ref 0.0–40.0)

## 2022-04-04 LAB — CBC WITH DIFFERENTIAL/PLATELET
Basophils Absolute: 0.1 10*3/uL (ref 0.0–0.1)
Basophils Relative: 1.3 % (ref 0.0–3.0)
Eosinophils Absolute: 0.2 10*3/uL (ref 0.0–0.7)
Eosinophils Relative: 2.1 % (ref 0.0–5.0)
HCT: 47.6 % (ref 39.0–52.0)
Hemoglobin: 16.2 g/dL (ref 13.0–17.0)
Lymphocytes Relative: 31.4 % (ref 12.0–46.0)
Lymphs Abs: 2.5 10*3/uL (ref 0.7–4.0)
MCHC: 34 g/dL (ref 30.0–36.0)
MCV: 99.4 fl (ref 78.0–100.0)
Monocytes Absolute: 0.6 10*3/uL (ref 0.1–1.0)
Monocytes Relative: 7.9 % (ref 3.0–12.0)
Neutro Abs: 4.5 10*3/uL (ref 1.4–7.7)
Neutrophils Relative %: 57.3 % (ref 43.0–77.0)
Platelets: 217 10*3/uL (ref 150.0–400.0)
RBC: 4.79 Mil/uL (ref 4.22–5.81)
RDW: 13.8 % (ref 11.5–15.5)
WBC: 7.8 10*3/uL (ref 4.0–10.5)

## 2022-04-04 LAB — PSA: PSA: 6.74 ng/mL — ABNORMAL HIGH (ref 0.10–4.00)

## 2022-04-04 NOTE — Addendum Note (Signed)
Addended by: Jeronimo Greaves on: 04/04/2022 09:43 AM   Modules accepted: Orders

## 2022-04-04 NOTE — Patient Instructions (Addendum)
For you wellness exam today I have ordered cbc, cmp and lipid panel.  Elevates psa. Repeat today and send result to urologist.  Shingrix vaccine offered today.  Recommend exercise and healthy diet.  We will let you know lab results as they come in.  Plan to repeat ct for aortic aneurysm yearly basis.  With ct chest finding emphysema if any shortness of breath rx symbicort. Also can refer to pulmonologist.  Htn- bp is high today. Will increase your losartan to 100 mg daily. You have 50 mg tabs and can take 2 tab daily. Let me know when you are bout to run out of bp med.  For bilateral thumb djd refer to hand specialist. Can continue voltaren gel.   Follow up date appointment will be determined after lab review.     Preventive Care 64-23 Years Old, Male Preventive care refers to lifestyle choices and visits with your health care provider that can promote health and wellness. Preventive care visits are also called wellness exams. What can I expect for my preventive care visit? Counseling During your preventive care visit, your health care provider may ask about your: Medical history, including: Past medical problems. Family medical history. Current health, including: Emotional well-being. Home life and relationship well-being. Sexual activity. Lifestyle, including: Alcohol, nicotine or tobacco, and drug use. Access to firearms. Diet, exercise, and sleep habits. Safety issues such as seatbelt and bike helmet use. Sunscreen use. Work and work Statistician. Physical exam Your health care provider will check your: Height and weight. These may be used to calculate your BMI (body mass index). BMI is a measurement that tells if you are at a healthy weight. Waist circumference. This measures the distance around your waistline. This measurement also tells if you are at a healthy weight and may help predict your risk of certain diseases, such as type 2 diabetes and high blood  pressure. Heart rate and blood pressure. Body temperature. Skin for abnormal spots. What immunizations do I need?  Vaccines are usually given at various ages, according to a schedule. Your health care provider will recommend vaccines for you based on your age, medical history, and lifestyle or other factors, such as travel or where you work. What tests do I need? Screening Your health care provider may recommend screening tests for certain conditions. This may include: Lipid and cholesterol levels. Diabetes screening. This is done by checking your blood sugar (glucose) after you have not eaten for a while (fasting). Hepatitis B test. Hepatitis C test. HIV (human immunodeficiency virus) test. STI (sexually transmitted infection) testing, if you are at risk. Lung cancer screening. Prostate cancer screening. Colorectal cancer screening. Talk with your health care provider about your test results, treatment options, and if necessary, the need for more tests. Follow these instructions at home: Eating and drinking  Eat a diet that includes fresh fruits and vegetables, whole grains, lean protein, and low-fat dairy products. Take vitamin and mineral supplements as recommended by your health care provider. Do not drink alcohol if your health care provider tells you not to drink. If you drink alcohol: Limit how much you have to 0-2 drinks a day. Know how much alcohol is in your drink. In the [place] one drink equals one 12 oz bottle of beer (355 mL), one 5 oz glass of wine (148 mL), or one 1 oz glass of hard liquor (44 mL). Lifestyle Brush your teeth every morning and night with fluoride toothpaste. Floss one time each day. Exercise for at least  30 minutes 5 or more days each week. Do not use any products that contain nicotine or tobacco. These products include cigarettes, chewing tobacco, and vaping devices, such as e-cigarettes. If you need help quitting, ask your health care provider. Do not  use drugs. If you are sexually active, practice safe sex. Use a condom or other form of protection to prevent STIs. Take aspirin only as told by your health care provider. Make sure that you understand how much to take and what form to take. Work with your health care provider to find out whether it is safe and beneficial for you to take aspirin daily. Find healthy ways to manage stress, such as: Meditation, yoga, or listening to music. Journaling. Talking to a trusted person. Spending time with friends and family. Minimize exposure to UV radiation to reduce your risk of skin cancer. Safety Always wear your seat belt while driving or riding in a vehicle. Do not drive: If you have been drinking alcohol. Do not ride with someone who has been drinking. When you are tired or distracted. While texting. If you have been using any mind-altering substances or drugs. Wear a helmet and other protective equipment during sports activities. If you have firearms in your house, make sure you follow all gun safety procedures. What's next? Go to your health care provider once a year for an annual wellness visit. Ask your health care provider how often you should have your eyes and teeth checked. Stay up to date on all vaccines. This information is not intended to replace advice given to you by your health care provider. Make sure you discuss any questions you have with your health care provider. Document Revised: 04/20/2021 Document Reviewed: 04/20/2021 Elsevier Patient Education  [place].

## 2022-04-04 NOTE — Addendum Note (Signed)
Addended by: Anabel Halon on: 04/04/2022 05:20 PM   Modules accepted: Orders

## 2022-04-04 NOTE — Progress Notes (Signed)
Subjective:    Patient ID: David Cunningham, male    DOB: 1956/08/09, 66 y.o.   MRN: [id number]  HPI  In for wellness exam  Pt is fasting. Pt retired Brewing technologist. Pt tries to go to gym at 3 timesa week and also very active. Pt states eating healthy. Smoke pack a day for 40 years. 2 shots of scotch at night. 1 cup of coffee a day.   Pt had 2 covid vaccines. No booster yet. Discussed shingrix vaccine today.  Up to date on colonoscopy.  Hx of smoking and got screening ct of chest. Emphysema type changes on ct. Pt did not go to pulmonologist as referred. He did not go to appt.  Htn- initially high bp. Pt does not check bp at home. On recheck .  Aortic aneurysm- on prior ct 07-13-2021. Repeat yearly.   Increased prostate velocity. Followed by urologist. States at end of April he had appt but prior office did not take his new insurance so did not go.  Bilateral arthritis base of both thumbs. Pt states failed injection and has seen orthopedist. He used voltaren gel and states does help.   Pt saw ortho below. Assessment & Plan: 1.  Bilateral thumb CMC arthrosis -Both thumbs injected today.  He will start glucosamine and turmeric.  Follow-up as needed.  I suggested CMC unloading braces as well.   2.  Improve left AC joint pain with underlying DJD -No injection today.  He will try the above   Review of Systems  Constitutional:  Negative for chills, fatigue and fever.  HENT:  Negative for congestion, ear discharge and ear pain.   Respiratory:  Negative for chest tightness, shortness of breath and wheezing.   Cardiovascular:  Negative for chest pain and palpitations.  Gastrointestinal:  Negative for abdominal pain and blood in stool.  Genitourinary:  Negative for dysuria, flank pain, frequency and penile pain.  Musculoskeletal:  Negative for myalgias and neck pain.  Skin:  Negative for rash.  Neurological:  Negative for dizziness, seizures, syncope, weakness and  light-headedness.  Psychiatric/Behavioral:  Negative for behavioral problems and confusion.     Past Medical History:  Diagnosis Date   Allergy    Arthritis    hand, neck   Back pain 09/11/2016   patient denies back pain but has neck pain   Bicuspid aortic valve    DEGENERATIVE DISC DISEASE, CERVICAL SPINE    Heart murmur    never has caused any problems   History of chicken pox    Hypertension    Migraines    last one 2 wks ago- allergy related    MITRAL VALVE PROLAPSE    PSA, INCREASED    TINNITUS, CHRONIC, BILATERAL      Social History   Socioeconomic History   Marital status: Married    Spouse name: Not on file   Number of children: Not on file   Years of education: Not on file   Highest education level: Not on file  Occupational History   Occupation: Retired  Tobacco Use   Smoking status: Every Day    Packs/day: 1.00    Years: 40.00    Pack years: 40.00    Types: Cigarettes   Smokeless tobacco: Never  Vaping Use   Vaping Use: Never used  Substance and Sexual Activity   Alcohol use: Yes    Alcohol/week: 7.0 standard drinks    Types: 7 Standard drinks or equivalent per week  Comment: 1 glass of scotch daily   Drug use: No   Sexual activity: Yes    Partners: Female    Comment: wife  Other Topics Concern   Not on file  Social History Narrative   Not on file   Social Determinants of Health   Financial Resource Strain: Not on file  Food Insecurity: Not on file  Transportation Needs: Not on file  Physical Activity: Not on file  Stress: Not on file  Social Connections: Not on file  Intimate Partner Violence: Not on file    Past Surgical History:  Procedure Laterality Date   CERVICAL DISC SURGERY  12/2019   CERVICAL SPINE SURGERY     COLONOSCOPY  2012   [place] hx polyps   CYST REMOVAL NECK     And Face   NASAL SEPTOPLASTY W/ TURBINOPLASTY Bilateral 09/25/2019   Procedure: NASAL SEPTOPLASTY WITH TURBINATE REDUCTION;  Surgeon: [name], MD;  Location: [place];  Service: ENT;  Laterality: Bilateral;   POLYPECTOMY     Colon   PROSTATE BIOPSY  06/2019   negative   SINUS ENDO WITH FUSION Bilateral 09/25/2019   Procedure: ENDOSCOPIC SINUS SURGERY WITH FUSION NAVIGATION;  Surgeon: [name], MD;  Location: [place];  Service: ENT;  Laterality: Bilateral;   Tonsillectome     WISDOM TOOTH EXTRACTION      Family History  Problem Relation Age of Onset   Colon cancer Mother 18       Deceased   Lung cancer Mother    Liver cancer Mother    Breast cancer Sister    Lymphoma Father 67       Deceased   Diabetes Father    Cancer Other        Paternal Grandparents   Cancer Other        Maternal Grandparents   Emphysema Paternal Grandfather    Emphysema Maternal Grandfather    Cancer Paternal Aunt    Cancer Maternal Aunt    Esophageal cancer Neg Hx    Stomach cancer Neg Hx    Rectal cancer Neg Hx     No Known Allergies  Current Outpatient Medications on File Prior to Visit  Medication Sig Dispense Refill   atorvastatin (LIPITOR) 10 MG tablet Take 1 tablet (10 mg total) by mouth daily. (Patient not taking: Reported on 10/11/2021) 30 tablet 3   cetirizine (ZYRTEC) 5 MG tablet Take 10 mg by mouth daily.     COVID-19 mRNA bivalent vaccine, Pfizer, injection Inject into the muscle. (Patient not taking: Reported on 10/11/2021) 0.3 mL 0   diclofenac Sodium (VOLTAREN) 1 % GEL Apply 2 g topically 4 (four) times daily. 100 g 1   fluticasone (FLONASE) 50 MCG/ACT nasal spray Place 2 sprays into both nostrils daily. 16 g 2   losartan (COZAAR) 50 MG tablet Take 1 tablet (50 mg total) by mouth daily. 90 tablet 3   naproxen sodium (ALEVE) 220 MG tablet Take 220 mg by mouth 2 (two) times daily as needed.     sodium chloride (OCEAN) 0.65 % SOLN nasal spray Place 1 spray into both nostrils as needed for congestion.     sodium polystyrene (KAYEXALATE) 15 GM/60ML suspension 60 ml po q day x 4  days. (Patient not taking: Reported on 10/11/2021) 240 mL 0   TURMERIC PO Take by mouth.     No current facility-administered medications on file prior to visit.    There were no vitals taken  for this visit.      Objective:   Physical Exam  General Mental Status- Alert. General Appearance- Not in acute distress.   Skin General: Color- Normal Color. Moisture- Normal Moisture.  Neck Carotid Arteries- Normal color. Moisture- Normal Moisture. No carotid bruits. No JVD.  Chest and Lung Exam Auscultation: Breath Sounds:-Normal.  Cardiovascular Auscultation:Rythm- Regular. Murmurs & Other Heart Sounds:Auscultation of the heart reveals- No Murmurs.  Abdomen Inspection:-Inspeection Normal. Palpation/Percussion:Note:No mass. Palpation and Percussion of the abdomen reveal- Non Tender, Non Distended + BS, no rebound or guarding.   Neurologic Cranial Nerve exam:- CN III-XII intact(No nystagmus), symmetric smile. Strength:- 5/5 equal and symmetric strength both upper and lower extremities.       Assessment & Plan:   Patient Instructions  For you wellness exam today I have ordered cbc, cmp and lipid panel.  Elevates psa. Repeat today and send result to urologist.  Shingrix vaccine offered today.  Recommend exercise and healthy diet.  We will let you know lab results as they come in.  Plan to repeat ct for aortic aneurysm yearly basis.  With ct chest finding empphysema if any shortness of breath rx symbicort. Also can refer to pulmonologist.  Htn- bp is high today. Will increase your losartan to 100 mg daily. You have 50 mg tabs and can take 2 tab daily. Let me know when you are bout to run out of bp med.  For bilateral thumb djd refer to hand specialist. Can continue voltaren gel.   Follow up date appointment will be determined after lab review.     Mackie Pai, [place]   [id number] charge today as did address thumb arthralgia and refer to ortho/hand specialist. Also did  address htn. Advise increased dose of losartan 100 mg daily. Educated on ascending aortic aneurysm.

## 2022-04-05 ENCOUNTER — Telehealth: Payer: Self-pay | Admitting: Medical

## 2022-04-05 NOTE — Addendum Note (Signed)
Addended by: Anabel Halon on: 04/05/2022 05:42 PM   Modules accepted: Orders

## 2022-04-05 NOTE — Telephone Encounter (Signed)
I put in new urologist referral. He states [facility] was the office that won't take his new insurance. So will you send to other urologist internal or external. That will accept insurance.

## 2022-04-18 ENCOUNTER — Ambulatory Visit (INDEPENDENT_AMBULATORY_CARE_PROVIDER_SITE_OTHER): Payer: 59

## 2022-04-18 DIAGNOSIS — I10 Essential (primary) hypertension: Secondary | ICD-10-CM

## 2022-04-18 NOTE — Progress Notes (Cosign Needed)
Pt here for Blood pressure check per Percell Miller Saguier  "Htn- bp is high today. Will increase your losartan to 100 mg daily. You have 50 mg tabs and can take 2 tab daily. Let me know when you are bout to run out of bp med."  Pt currently takes: Losartan 100 mg  Pt reports compliance with medication.  BP 163/70: automatic cuff, L arm HR = 66  BP 140/68: manual cuff, R arm HR= 65  Next appointment: 06/06/22- [place] for Shingles vaccine  Pt advised that since ES was in a room I would get instructions from you and give him a call with  the plan.   Sent message to staff and to pt by my chart. Add on amlodipine 5 mg daily. Nurse bp check in one week.  Mackie Pai, PA-C

## 2022-04-19 ENCOUNTER — Telehealth: Payer: Self-pay

## 2022-04-19 ENCOUNTER — Ambulatory Visit: Payer: Self-pay | Admitting: Urology

## 2022-04-19 ENCOUNTER — Encounter: Payer: Self-pay | Admitting: Medical

## 2022-04-19 MED ORDER — AMLODIPINE BESYLATE 5 MG PO TABS: 5.0000 mg | ORAL_TABLET | Freq: Every day | ORAL | 0 refills | Status: DC

## 2022-04-19 NOTE — Telephone Encounter (Signed)
Let pt know recommend adding on amlodipine 5 mg daily. Continue losartan 100 mg daily.   Follow up one week. Nurse bp check. Second reading other day better. But would like systolic top number closer to 130. Sorry for delayed response. Last 2 days have been very busy.   Mackie Pai, PA-C            Pt made aware , [place] scheduled for 04/28/22 at 9:45

## 2022-04-19 NOTE — Addendum Note (Signed)
Addended by: Anabel Halon on: 04/19/2022 10:09 AM   Modules accepted: Orders

## 2022-04-25 ENCOUNTER — Ambulatory Visit (INDEPENDENT_AMBULATORY_CARE_PROVIDER_SITE_OTHER): Payer: 59

## 2022-04-25 ENCOUNTER — Encounter: Payer: Self-pay | Admitting: Orthopaedic Surgery

## 2022-04-25 ENCOUNTER — Ambulatory Visit: Payer: 59 | Admitting: Orthopaedic Surgery

## 2022-04-25 ENCOUNTER — Ambulatory Visit: Payer: Self-pay

## 2022-04-25 DIAGNOSIS — M1811 Unilateral primary osteoarthritis of first carpometacarpal joint, right hand: Secondary | ICD-10-CM | POA: Diagnosis not present

## 2022-04-25 DIAGNOSIS — M1812 Unilateral primary osteoarthritis of first carpometacarpal joint, left hand: Secondary | ICD-10-CM | POA: Insufficient documentation

## 2022-04-25 NOTE — Progress Notes (Signed)
Office Visit Note   Patient: David Cunningham           Date of Birth: 04-04-1956           MRN: [id number] Visit Date: 04/25/2022              Requested by: Mackie Pai, PA-C [place],  [place] [id number] PCP: Mackie Pai, PA-C   Assessment & Plan: Visit Diagnoses:  1. Primary osteoarthritis of first carpometacarpal joint of right hand   2. Primary osteoarthritis of first carpometacarpal joint of left hand     Plan: Impression is advanced bilateral thumb CMC arthritis.  Treatment options explained.  Overall sounds like he is able to live with it and cannot compensate and the pain is more of an annoyance.  He would like to wait till he is on Medicare before he explores surgical treatment.  He will continue conservative management for now.  Questions encouraged and answered.  Follow-Up Instructions: No follow-ups on file.   Orders:  Orders Placed This Encounter  Procedures   XR Finger Thumb Left   XR Finger Thumb Right   No orders of the defined types were placed in this encounter.     Procedures: No procedures performed   Clinical Data: No additional findings.   Subjective: Chief Complaint  Patient presents with   Right Hand - Pain    Bilateral thumb pain   Left Hand - Pain    HPI David Cunningham is a very pleasant 66 year old gentleman here for follow-up of bilateral thumb CMC DJD.  This affects him while golfing and with activities.  Right-hand-dominant.  Has had cortisone injections without significant relief.  Manages with Voltaren gel.  Wants to explore other options.  Review of Systems  Constitutional: Negative.   All other systems reviewed and are negative.    Objective: Vital Signs: There were no vitals taken for this visit.  Physical Exam Vitals and nursing note reviewed.  Constitutional:      Appearance: He is well-developed.  HENT:     Head: Normocephalic and atraumatic.  Eyes:     Pupils: Pupils are equal, round, and  reactive to light.  Pulmonary:     Effort: Pulmonary effort is normal.  Abdominal:     Palpations: Abdomen is soft.  Musculoskeletal:        General: Normal range of motion.     Cervical back: Neck supple.  Skin:    General: Skin is warm.  Neurological:     Mental Status: He is alert and oriented to person, place, and time.  Psychiatric:        Behavior: Behavior normal.        Thought Content: Thought content normal.        Judgment: Judgment normal.     Ortho Exam Examination of bilateral thumbs show pain and crepitus with grind test.  He can oppose the thumb tip to the fifth metacarpal head.  Specialty Comments:  No specialty comments available.  Imaging: XR Finger Thumb Left  Result Date: 04/25/2022 Advanced thumb CMC degenerative joint disease with bone-on-bone joint space narrowing.  XR Finger Thumb Right  Result Date: 04/25/2022 Advanced thumb CMC degenerative joint disease with bone-on-bone joint space narrowing.    PMFS History: Patient Active Problem List   Diagnosis Date Noted   Primary osteoarthritis of first carpometacarpal joint of right hand 04/25/2022   Primary osteoarthritis of first carpometacarpal joint of left hand 04/25/2022   Cervical spondylosis  with radiculopathy 06/16/2019   Chronic pansinusitis 04/21/2019   Nasal turbinate hypertrophy 03/19/2019   Nasal septal deviation 03/19/2019   Encounter for screening for lung cancer 01/14/2018   Right-sided thoracic back pain 09/11/2016   Situational anxiety 09/04/2016   Warts of foot 10/25/2015   Visit for preventive health examination 10/06/2014   Prostate cancer screening 10/06/2014   Bicuspid aortic valve 09/24/2012   Hypertension 09/24/2012   DEGENERATIVE DISC DISEASE, CERVICAL SPINE 02/18/2010   TOBACCO ABUSE 07/08/2009   TINNITUS, CHRONIC, BILATERAL 07/08/2009   Mitral valve disorder 07/08/2009   Chronic rhinitis 07/08/2009   PSA, INCREASED 07/08/2009   Past Medical History:  Diagnosis  Date   Allergy    Arthritis    hand, neck   Back pain 09/11/2016   patient denies back pain but has neck pain   Bicuspid aortic valve    DEGENERATIVE DISC DISEASE, CERVICAL SPINE    Heart murmur    never has caused any problems   History of chicken pox    Hypertension    Migraines    last one 2 wks ago- allergy related    MITRAL VALVE PROLAPSE    PSA, INCREASED    TINNITUS, CHRONIC, BILATERAL     Family History  Problem Relation Age of Onset   Colon cancer Mother 42       Deceased   Lung cancer Mother    Liver cancer Mother    Breast cancer Sister    Lymphoma Father 55       Deceased   Diabetes Father    Cancer Other        Paternal Grandparents   Cancer Other        Maternal Grandparents   Emphysema Paternal Grandfather    Emphysema Maternal Grandfather    Cancer Paternal Aunt    Cancer Maternal Aunt    Esophageal cancer Neg Hx    Stomach cancer Neg Hx    Rectal cancer Neg Hx     Past Surgical History:  Procedure Laterality Date   CERVICAL DISC SURGERY  12/2019   CERVICAL SPINE SURGERY     COLONOSCOPY  2012   [place] hx polyps   CYST REMOVAL NECK     And Face   NASAL SEPTOPLASTY W/ TURBINOPLASTY Bilateral 09/25/2019   Procedure: NASAL SEPTOPLASTY WITH TURBINATE REDUCTION;  Surgeon: [name], MD;  Location: [facility];  Service: ENT;  Laterality: Bilateral;   POLYPECTOMY     Colon   PROSTATE BIOPSY  06/2019   negative   SINUS ENDO WITH FUSION Bilateral 09/25/2019   Procedure: ENDOSCOPIC SINUS SURGERY WITH FUSION NAVIGATION;  Surgeon: [name], MD;  Location: [facility];  Service: ENT;  Laterality: Bilateral;   Tonsillectome     WISDOM TOOTH EXTRACTION     Social History   Occupational History   Occupation: Retired  Tobacco Use   Smoking status: Every Day    Packs/day: 1.00    Years: 40.00    Total pack years: 40.00    Types: Cigarettes   Smokeless tobacco: Never  Vaping Use   Vaping Use: Never used   Substance and Sexual Activity   Alcohol use: Yes    Alcohol/week: 7.0 standard drinks of alcohol    Types: 7 Standard drinks or equivalent per week    Comment: 1 glass of scotch daily   Drug use: No   Sexual activity: Yes    Partners: Female    Comment: wife

## 2022-04-28 ENCOUNTER — Ambulatory Visit (INDEPENDENT_AMBULATORY_CARE_PROVIDER_SITE_OTHER): Payer: 59

## 2022-04-28 DIAGNOSIS — I10 Essential (primary) hypertension: Secondary | ICD-10-CM | POA: Diagnosis not present

## 2022-05-04 ENCOUNTER — Encounter: Payer: Self-pay | Admitting: Urology

## 2022-05-04 ENCOUNTER — Ambulatory Visit (INDEPENDENT_AMBULATORY_CARE_PROVIDER_SITE_OTHER): Payer: 59 | Admitting: Urology

## 2022-05-04 VITALS — BP 138/62 | HR 60

## 2022-05-04 DIAGNOSIS — N400 Enlarged prostate without lower urinary tract symptoms: Secondary | ICD-10-CM

## 2022-05-04 DIAGNOSIS — R972 Elevated prostate specific antigen [PSA]: Secondary | ICD-10-CM | POA: Diagnosis not present

## 2022-05-04 LAB — URINALYSIS, ROUTINE W REFLEX MICROSCOPIC
Bilirubin, UA: NEGATIVE
Glucose, UA: NEGATIVE
Ketones, UA: NEGATIVE
Nitrite, UA: NEGATIVE
Protein,UA: NEGATIVE
RBC, UA: NEGATIVE
Specific Gravity, UA: 1.015 (ref 1.005–1.030)
Urobilinogen, Ur: 0.2 mg/dL (ref 0.2–1.0)
pH, UA: 5.5 (ref 5.0–7.5)

## 2022-05-04 LAB — MICROSCOPIC EXAMINATION
Bacteria, UA: NONE SEEN
Epithelial Cells (non renal): NONE SEEN /hpf (ref 0–10)
RBC, Urine: NONE SEEN /hpf (ref 0–2)
Renal Epithel, UA: NONE SEEN /hpf

## 2022-05-04 NOTE — Progress Notes (Signed)
Subjective: 1. Elevated PSA   2. Benign prostatic hyperplasia, unspecified whether lower urinary tract symptoms present    David Cunningham was formerly seen in [place] for his elevated PSA and had a negative biopsy for a 56m prostate in 8/20.  His PSA last year was 4.4 but it is now up to  6.74.  It has been trending up over the last few years.   He is voiding well with an IPSS of 3.  He has some urgency.  He has had no hematuria.   He has no bone pain or weight loss but has some arthralgias.     ROS:  Review of Systems  HENT:  Positive for congestion.   Musculoskeletal:  Positive for back pain and joint pain.  All other systems reviewed and are negative.   No Known Allergies  Past Medical History:  Diagnosis Date   Allergy    Arthritis    hand, neck   Back pain 09/11/2016   patient denies back pain but has neck pain   Bicuspid aortic valve    DEGENERATIVE DISC DISEASE, CERVICAL SPINE    Heart murmur    never has caused any problems   History of chicken pox    Hypertension    Migraines    last one 2 wks ago- allergy related    MITRAL VALVE PROLAPSE    PSA, INCREASED    TINNITUS, CHRONIC, BILATERAL     Past Surgical History:  Procedure Laterality Date   CERVICAL DISC SURGERY  12/2019   CERVICAL SPINE SURGERY     COLONOSCOPY  2012   [place] hx polyps   CYST REMOVAL NECK     And Face   NASAL SEPTOPLASTY W/ TURBINOPLASTY Bilateral 09/25/2019   Procedure: NASAL SEPTOPLASTY WITH TURBINATE REDUCTION;  Surgeon: SJerrell Belfast MD;  Location: [facility]  Service: ENT;  Laterality: Bilateral;   POLYPECTOMY     Colon   PROSTATE BIOPSY  06/2019   negative   SINUS ENDO WITH FUSION Bilateral 09/25/2019   Procedure: ENDOSCOPIC SINUS SURGERY WITH FUSION NAVIGATION;  Surgeon: SJerrell Belfast MD;  Location[facility]  Service: ENT;  Laterality: Bilateral;   Tonsillectome     WISDOM TOOTH EXTRACTION      Social History   Socioeconomic History    Marital status: Married    Spouse name: Not on file   Number of children: Not on file   Years of education: Not on file   Highest education level: Not on file  Occupational History   Occupation: Retired  Tobacco Use   Smoking status: Every Day    Packs/day: 1.00    Years: 40.00    Total pack years: 40.00    Types: Cigarettes   Smokeless tobacco: Never  Vaping Use   Vaping Use: Never used  Substance and Sexual Activity   Alcohol use: Yes    Alcohol/week: 7.0 standard drinks of alcohol    Types: 7 Standard drinks or equivalent per week    Comment: 1 glass of scotch daily   Drug use: No   Sexual activity: Yes    Partners: Female    Comment: wife  Other Topics Concern   Not on file  Social History Narrative   Not on file   Social Determinants of Health   Financial Resource Strain: Not on file  Food Insecurity: Not on file  Transportation Needs: Not on file  Physical Activity: Not on file  Stress: Not on file  Social  Connections: Not on file  Intimate Partner Violence: Not on file    Family History  Problem Relation Age of Onset   Colon cancer Mother 64       Deceased   Lung cancer Mother    Liver cancer Mother    Breast cancer Sister    Lymphoma Father 9       Deceased   Diabetes Father    Cancer Other        Paternal Grandparents   Cancer Other        Maternal Grandparents   Emphysema Paternal Grandfather    Emphysema Maternal Grandfather    Cancer Paternal Aunt    Cancer Maternal Aunt    Esophageal cancer Neg Hx    Stomach cancer Neg Hx    Rectal cancer Neg Hx     Anti-infectives: Anti-infectives (From admission, onward)    None       Current Outpatient Medications  Medication Sig Dispense Refill   amLODipine (NORVASC) 5 MG tablet Take 1 tablet (5 mg total) by mouth daily. 30 tablet 0   cetirizine (ZYRTEC) 5 MG tablet Take 10 mg by mouth daily.     COVID-19 mRNA bivalent vaccine, Pfizer, injection Inject into the muscle. 0.3 mL 0    diclofenac Sodium (VOLTAREN) 1 % GEL Apply 2 g topically 4 (four) times daily. 100 g 1   fluticasone (FLONASE) 50 MCG/ACT nasal spray Place 2 sprays into both nostrils daily. 16 g 2   losartan (COZAAR) 50 MG tablet Take 1 tablet (50 mg total) by mouth daily. 90 tablet 3   naproxen sodium (ALEVE) 220 MG tablet Take 220 mg by mouth 2 (two) times daily as needed.     sodium chloride (OCEAN) 0.65 % SOLN nasal spray Place 1 spray into both nostrils as needed for congestion.     TURMERIC PO Take by mouth.     No current facility-administered medications for this visit.     Objective: Vital signs in last 24 hours: BP 138/62   Pulse 60   Intake/Output from previous day: No intake/output data recorded. Intake/Output this shift: '@IOTHISSHIFT'$ @   Physical Exam Vitals reviewed.  Constitutional:      Appearance: Normal appearance.  Genitourinary:    Comments: AP without lesion. NST without mass. Prostate 2+ firm. SV non-palpable.  Neurological:     Mental Status: He is alert.     Lab Results:  Results for orders placed or performed in visit on 05/04/22 (from the past 24 hour(s))  Urinalysis, Routine w reflex microscopic     Status: Abnormal   Collection Time: 05/04/22 10:04 AM  Result Value Ref Range   Specific Gravity, UA 1.015 1.005 - 1.030   pH, UA 5.5 5.0 - 7.5   Color, UA Yellow Yellow   Appearance Ur Clear Clear   Leukocytes,UA Trace (A) Negative   Protein,UA Negative Negative/Trace   Glucose, UA Negative Negative   Ketones, UA Negative Negative   RBC, UA Negative Negative   Bilirubin, UA Negative Negative   Urobilinogen, Ur 0.2 0.2 - 1.0 mg/dL   Nitrite, UA Negative Negative   Microscopic Examination See below:    Narrative   Performed at:  [facility] [address] [place], [place]  [id number] Lab Director: Mina Marble MT, Phone:  [phone]  Microscopic Examination     Status: None   Collection Time: 05/04/22 10:04 AM   Urine  Result Value  Ref Range   WBC, UA 0-5 0 -  5 /hpf   RBC, Urine None seen 0 - 2 /hpf   Epithelial Cells (non renal) None seen 0 - 10 /hpf   Renal Epithel, UA None seen None seen /hpf   Bacteria, UA None seen None seen/Few   Narrative   Performed at:  [facility] [address], [place], [place]  [id number] Lab Director: Dowell, Phone:  [phone]    Lab Results  Component Value Date   PSA 6.74 (H) 04/04/2022    UA has 0-5 WBC.    BMET No results for input(s): "NA", "K", "CL", "CO2", "GLUCOSE", "BUN", "CREATININE", "CALCIUM" in the last 72 hours. PT/INR No results for input(s): "LABPROT", "INR" in the last 72 hours. ABG No results for input(s): "PHART", "HCO3" in the last 72 hours.  Invalid input(s): "PCO2", "PO2"  Studies/Results: No results found.   Assessment/Plan: Elevated PSA.  His PSA is up from last year but has been variable.  I will get a repeat with a week of abstinence and if down, he will return with another PSA in 3 months.  If still up, I will get an MRIP.  BPH with minimal LUTS.   No orders of the defined types were placed in this encounter.    Orders Placed This Encounter  Procedures   Microscopic Examination   Urinalysis, Routine w reflex microscopic   PSA, total and free    Wants to have it done at [facility].    Standing Status:   Future    Standing Expiration Date:   08/04/2022   PSA, total and free    Would like to have it drawn at [facility].    Standing Status:   Future    Standing Expiration Date:   11/03/2022     Return in about 3 months (around 08/04/2022) for PSA in a week and then in 3 months with F/U. Marland Kitchen         Irine Seal 05/05/2022 [phone]

## 2022-05-05 MED ORDER — LOSARTAN POTASSIUM 100 MG PO TABS: 100.0000 mg | ORAL_TABLET | Freq: Every day | ORAL | 3 refills | Status: DC

## 2022-05-05 NOTE — Addendum Note (Signed)
Addended by: Anabel Halon on: 05/05/2022 09:22 AM   Modules accepted: Orders

## 2022-05-08 NOTE — Addendum Note (Signed)
Addended by: Anabel Halon on: 05/08/2022 05:32 PM   Modules accepted: Orders

## 2022-05-10 ENCOUNTER — Other Ambulatory Visit (INDEPENDENT_AMBULATORY_CARE_PROVIDER_SITE_OTHER): Payer: 59

## 2022-05-10 DIAGNOSIS — R972 Elevated prostate specific antigen [PSA]: Secondary | ICD-10-CM | POA: Diagnosis not present

## 2022-05-11 LAB — PSA, TOTAL AND FREE
PSA, % Free: 23 % (calc) — ABNORMAL LOW (ref >25)
PSA, Free: 1 ng/mL
PSA, Total: 4.3 ng/mL — ABNORMAL HIGH (ref <=4.0)

## 2022-05-12 ENCOUNTER — Ambulatory Visit (HOSPITAL_COMMUNITY): Payer: 59 | Attending: Cardiology

## 2022-05-12 DIAGNOSIS — I350 Nonrheumatic aortic (valve) stenosis: Secondary | ICD-10-CM | POA: Diagnosis not present

## 2022-05-12 LAB — ECHOCARDIOGRAM COMPLETE
AR max vel: 2.51 cm2
AV Mean grad: 23 mmHg
AV Peak grad: 39.7 mmHg
Ao pk vel: 3.15 m/s
P 1/2 time: 117 msec
S' Lateral: 3.9 cm

## 2022-05-18 ENCOUNTER — Other Ambulatory Visit: Payer: Self-pay | Admitting: Medical

## 2022-06-06 ENCOUNTER — Ambulatory Visit (INDEPENDENT_AMBULATORY_CARE_PROVIDER_SITE_OTHER): Payer: 59

## 2022-06-06 DIAGNOSIS — Z23 Encounter for immunization: Secondary | ICD-10-CM | POA: Diagnosis not present

## 2022-06-06 NOTE — Progress Notes (Addendum)
David Cunningham is a 66 y.o. male presents to the office today for  # 2 Shingles  injections, per physician's orders. Original order:  shingles (med),  (dose), Right deltoid  (route) was administered  (location) today. Patient tolerated injection. Patient due for follow up labs/provider appt: Marland Kitchen Date due: appt made  Patient next injection due: , appt made   [place], PA-C

## 2022-06-16 ENCOUNTER — Encounter: Payer: Self-pay | Admitting: Medical

## 2022-06-20 NOTE — Progress Notes (Unsigned)
Date:  06/20/2022   ID:  David Cunningham, DOB 1956/01/01, MRN [id number]   Provider Location: Office  PCP:  [name]  Cardiologist:  [name] Electrophysiologist:  None   Evaluation Performed:  Follow-Up Visit In person   Chief Complaint:  AV Disease  History of Present Illness:     66 y.o. f/u for bicuspid AV disease. No family history of cardiac disease. No history of CAD or chest pain.  Poor diet with excess salt, smokes and has scotch most nights. Normal ETT 10/09/12 with HTN response.  TTE: 05/12/22 EF 67-54% grade 2 diastolic mild/moderate MR ? Myxomatous bicuspid AV with severe AR moderate AS mean gradient 23 mmHg peak 40 mmHg DVI 0.34 AR T1/2 114 msec Progression of AR since 2022 His pressure half time < 250 msec and holodiastolic reversal in ascending aorta LV dimensions 39 mm ES and 56 mm ED    Cardiac CTA 05/25/20 calcium score 22 isolated to LAD only 27 th percentile for age/sex. Left dominant with no obstructive disease CAD RADS 1 AV calcium score 534 bicuspid valve fused right and left cusps Ascending aortic root 4.2 cm    CT head HP 02/2015 no aneurysm normal carotid system   Dentition in good shape  Youngest son graduated from [employer] And is home now  Step daughter in [place] and oldest son in [place]   He feels great retired 2022 from [facility] showed him his latest TTE images. Discussed natural history of severe AR and indications for surgery including symptoms, high ED/ES dimensions , low EF Aortic aneurysm > 5 cm in setting of bicuspid valve Currently he feels great. Using treadmill 30 minutes 3x/week and lifting weights Moving yard and active. No CHF/Dyspnea, chest pain palpitations or syncope. BP elevated discussed increasing norvasc to 10 mg.   Also discussed smoking cessation Needs f/u CT chest negative 08/03/21 but he wants to wait until he is 75 on medicare next June    Past Medical History:  Diagnosis Date   Allergy    Arthritis     hand, neck   Back pain 09/11/2016   patient denies back pain but has neck pain   Bicuspid aortic valve    DEGENERATIVE DISC DISEASE, CERVICAL SPINE    Heart murmur    never has caused any problems   History of chicken pox    Hypertension    Migraines    last one 2 wks ago- allergy related    MITRAL VALVE PROLAPSE    PSA, INCREASED    TINNITUS, CHRONIC, BILATERAL    Past Surgical History:  Procedure Laterality Date   CERVICAL DISC SURGERY  12/2019   CERVICAL SPINE SURGERY     COLONOSCOPY  2012   [place] hx polyps   CYST REMOVAL NECK     And Face   NASAL SEPTOPLASTY W/ TURBINOPLASTY Bilateral 09/25/2019   Procedure: NASAL SEPTOPLASTY WITH TURBINATE REDUCTION;  Surgeon: [name], MD;  Location: [facility];  Service: ENT;  Laterality: Bilateral;   POLYPECTOMY     Colon   PROSTATE BIOPSY  06/2019   negative   SINUS ENDO WITH FUSION Bilateral 09/25/2019   Procedure: ENDOSCOPIC SINUS SURGERY WITH FUSION NAVIGATION;  Surgeon: [name], MD;  Location: [facility];  Service: ENT;  Laterality: Bilateral;   Tonsillectome     WISDOM TOOTH EXTRACTION       No outpatient medications have been marked as taking for the  06/21/22 encounter (Appointment) with Josue Hector, MD.     Allergies:   Patient has no known allergies.   Social History   Tobacco Use   Smoking status: Every Day    Packs/day: 1.00    Years: 40.00    Total pack years: 40.00    Types: Cigarettes   Smokeless tobacco: Never  Vaping Use   Vaping Use: Never used  Substance Use Topics   Alcohol use: Yes    Alcohol/week: 7.0 standard drinks of alcohol    Types: 7 Standard drinks or equivalent per week    Comment: 1 glass of scotch daily   Drug use: No     Family Hx: The patient's family history includes Breast cancer in his sister; Cancer in his maternal aunt, paternal aunt, and other family members; Colon cancer (age of onset: 84) in his mother; Diabetes in his  father; Emphysema in his maternal grandfather and paternal grandfather; Liver cancer in his mother; Lung cancer in his mother; Lymphoma (age of onset: 66) in his father. There is no history of Esophageal cancer, Stomach cancer, or Rectal cancer.  ROS:   Please see the history of present illness.     All other systems reviewed and are negative.   Prior CV studies:   The following studies were reviewed today:  Echo 05/12/22 compared to 05/16/21   Labs/Other Tests and Data Reviewed:    EKG:   06/20/2022 NSR rate 59 normal 06/21/2022 NSR rate 62 normal minor voltage LVH   Recent Labs: 04/04/2022: ALT 21; BUN 17; Creatinine, Ser 0.98; Hemoglobin 16.2; Platelets 217.0; Potassium 5.1; Sodium 139   Recent Lipid Panel Lab Results  Component Value Date/Time   CHOL 162 04/04/2022 09:47 AM   TRIG 38.0 04/04/2022 09:47 AM   HDL 84.40 04/04/2022 09:47 AM   CHOLHDL 2 04/04/2022 09:47 AM   LDLCALC 70 04/04/2022 09:47 AM    Wt Readings from Last 3 Encounters:  04/04/22 166 lb 12.8 oz (75.7 kg)  10/11/21 166 lb 6.4 oz (75.5 kg)  03/31/21 163 lb (73.9 kg)     Objective:    Vital Signs:  There were no vitals taken for this visit.   Affect appropriate Healthy:  appears stated age 66: normal Neck supple with no adenopathy JVP normal no bruits no thyromegaly Lungs clear with no wheezing and good diaphragmatic motion Heart:  S1/S2 AS/AR murmur, no rub, gallop or click PMI normal Abdomen: benighn, BS positve, no tenderness, no AAA no bruit.  No HSM or HJR Distal pulses intact with no bruits No edema Neuro non-focal Skin warm and dry No muscular weakness   ASSESSMENT & PLAN:    Bicuspid AV:  Severe AR moderate AS LV compensated Signs of elevated EDP He is active, asymptomatic with low ED/ES dimensions will get objective evidence of good functional capacity with cardiopulmonary stress test F/U in 6 months Currently no indication for cath or AVR Allergies:  Continue zyrtec consider  flonase  F/u primary  ETOH:  Encouraged more moderation in intake labs/LFTls with primary Smoking:  Counseled on smoking cessation for less than 10 minutes Has had script for Chantix CT 07/14/21 with no cancer Aortal 4.1 cm  will repeat next June when on medicare  HTN:  Continue high dose Cozaar Increase norvasc to 10 mg  ENT:  Post septoplasty had some sinusitis despite surgery f/u ENT [facility]  COVID-19 Education: The signs and symptoms of COVID-19 were discussed with the patient and how to seek care for  testing (follow up with PCP or arrange E-visit).  The importance of social distancing was discussed today.  Medication Adjustments/Labs and Tests Ordered: Current medicines are reviewed at length with the patient today.  Concerns regarding medicines are outlined above.   Tests Ordered:  Cardiopulmonary stress test  Increase Norvasc to 10 mg   Medication Changes: No orders of the defined types were placed in this encounter.    Disposition:  Follow up 6 months   Signed, Jenkins Rouge, MD  06/20/2022 7:19 AM    [place]

## 2022-06-21 ENCOUNTER — Encounter: Payer: Self-pay | Admitting: Cardiovascular Disease

## 2022-06-21 ENCOUNTER — Ambulatory Visit: Payer: 59 | Admitting: Cardiovascular Disease

## 2022-06-21 VITALS — BP 130/70 | HR 62 | Ht 69.0 in | Wt 166.8 lb

## 2022-06-21 DIAGNOSIS — I10 Essential (primary) hypertension: Secondary | ICD-10-CM | POA: Diagnosis not present

## 2022-06-21 DIAGNOSIS — I351 Nonrheumatic aortic (valve) insufficiency: Secondary | ICD-10-CM | POA: Diagnosis not present

## 2022-06-21 DIAGNOSIS — Q231 Congenital insufficiency of aortic valve: Secondary | ICD-10-CM | POA: Diagnosis not present

## 2022-06-21 MED ORDER — AMLODIPINE BESYLATE 10 MG PO TABS: 10.0000 mg | ORAL_TABLET | Freq: Every day | ORAL | 3 refills | Status: DC

## 2022-06-21 NOTE — Patient Instructions (Addendum)
Medication Instructions:  Your physician has recommended you make the following change in your medication:  1-INCREASE amlodipine 10 mg by mouth daily.  *If you need a refill on your cardiac medications before your next appointment, please call your pharmacy*  Lab Work: If you have labs (blood work) drawn today and your tests are completely normal, you will receive your results only by: [facility] (if you have MyChart) OR A paper copy in the mail If you have any lab test that is abnormal or we need to change your treatment, we will call you to review the results.  Testing/Procedures: Your physician has recommended that you have a cardiopulmonary stress test (CPX). CPX testing is a non-invasive measurement of heart and lung function. It replaces a traditional treadmill stress test. This type of test provides a tremendous amount of information that relates not only to your present condition but also for future outcomes. This test combines measurements of you ventilation, respiratory gas exchange in the lungs, electrocardiogram (EKG), blood pressure and physical response before, during, and following an exercise protocol.  Follow-Up: At [facility], you and your health needs are our priority.  As part of our continuing mission to provide you with exceptional heart care, we have created designated Provider Care Teams.  These Care Teams include your primary Cardiologist (physician) and Advanced Practice Providers (APPs -  Physician Assistants and Nurse Practitioners) who all work together to provide you with the care you need, when you need it.  We recommend signing up for the patient portal called "MyChart".  Sign up information is provided on this After Visit Summary.  MyChart is used to connect with patients for Virtual Visits (Telemedicine).  Patients are able to view lab/test results, encounter notes, upcoming appointments, etc.  Non-urgent messages can be sent to your provider as well.    To learn more about what you can do with MyChart, go to [email or web address].    Your next appointment:   6 month  The format for your next appointment:   In Person  Provider:   Jenkins Rouge, MD {    Important Information About Sugar

## 2022-06-26 ENCOUNTER — Ambulatory Visit (HOSPITAL_COMMUNITY): Payer: 59 | Attending: Internal Medicine

## 2022-06-26 DIAGNOSIS — Q231 Congenital insufficiency of aortic valve: Secondary | ICD-10-CM | POA: Insufficient documentation

## 2022-06-27 ENCOUNTER — Telehealth: Payer: Self-pay | Admitting: Cardiovascular Disease

## 2022-06-27 NOTE — Telephone Encounter (Signed)
Follow Up:      Patient is returning Pam's call from yesterday, concerning his Stress Test results.

## 2022-06-27 NOTE — Telephone Encounter (Signed)
Josue Hector, MD  06/26/2022  1:17 PM EDT     Normal exercise capacity more lung limitation to exercise not heart ok to keep watching valve     The patient has been notified of the result and verbalized understanding.  All questions (if any) were answered. Nuala Alpha, LPN 4/36/0677 0:34 AM

## 2022-07-27 ENCOUNTER — Other Ambulatory Visit: Payer: 59

## 2022-08-10 ENCOUNTER — Ambulatory Visit: Payer: 59 | Admitting: Urology

## 2022-10-10 ENCOUNTER — Ambulatory Visit: Payer: 59 | Admitting: Dermatology

## 2022-10-31 ENCOUNTER — Telehealth: Payer: Self-pay | Admitting: Medical

## 2022-10-31 MED ORDER — LOSARTAN POTASSIUM 100 MG PO TABS: 100.0000 mg | ORAL_TABLET | Freq: Every day | ORAL | 0 refills | Status: DC

## 2022-10-31 MED ORDER — AMLODIPINE BESYLATE 10 MG PO TABS: 10.0000 mg | ORAL_TABLET | Freq: Every day | ORAL | 0 refills | Status: DC

## 2022-10-31 NOTE — Telephone Encounter (Signed)
Patient [place] heart medication while [place]. He would like 5 days worth of his medications sent to a pharmacy close to him. He states he does not have acces to his mychart, and is unsure of both medications names. Please advise.  [facility], [place] [address] Phone: [phone]

## 2022-10-31 NOTE — Telephone Encounter (Signed)
Rx sent 

## 2022-11-02 ENCOUNTER — Other Ambulatory Visit: Payer: 59

## 2022-11-09 ENCOUNTER — Ambulatory Visit: Payer: 59 | Admitting: Urology

## 2022-12-11 NOTE — Progress Notes (Signed)
Date:  12/18/2022   ID:  David Cunningham, DOB 02/22/1956, MRN [id number]   Provider Location: Office  PCP:  [name]  Cardiologist:  [name] Electrophysiologist:  None   Evaluation Performed:  Follow-Up Visit In person   Chief Complaint:  AV Disease  History of Present Illness:     67 y.o. f/u for bicuspid AV disease. No family history of cardiac disease. No history of CAD or chest pain.  Poor diet with excess salt, smokes and has scotch most nights. Normal ETT 10/09/12 with HTN response.  TTE: 05/12/22 EF 123456 grade 2 diastolic mild/moderate MR ? Myxomatous bicuspid AV with severe AR moderate AS mean gradient 23 mmHg peak 40 mmHg DVI 0.34 AR T1/2 114 msec Progression of AR since 2022 His pressure half time < 250 msec and holodiastolic reversal in ascending aorta LV dimensions 39 mm ES and 56 mm ED    Cardiac CTA 05/25/20 calcium score 22 isolated to LAD only 27 th percentile for age/sex. Left dominant with no obstructive disease CAD RADS 1 AV calcium score 534 bicuspid valve fused right and left cusps Ascending aortic root 4.2 cm    CT head HP 02/2015 no aneurysm normal carotid system   Dentition in good shape  Youngest son graduated from [employer] And is home now  Step daughter in [place] and oldest son in [place]   He feels great retired 2022 from [employer] showed him his latest TTE images. Discussed natural history of severe AR and indications for surgery including symptoms, high ED/ES dimensions , low EF Aortic aneurysm > 5 cm in setting of bicuspid valve Currently he feels great. Using treadmill 30 minutes 3x/week and lifting weights Moving yard and active. No CHF/Dyspnea, chest pain palpitations or syncope. BP elevated discussed increasing norvasc to 10 mg.   Also discussed smoking cessation Needs f/u CT chest negative 08/03/21 but he wants to wait until he is 45 on medicare next June   Last visit in August Norvasc increased to 10 mg   Cardiopulmonary  stress test: showed low normal VO2 24.5 ml/kg/min 82% predicted no cardiac limitations some pulmonary restrictive limitations   Retired will be on medicare in June Active at gym no cardiac symptoms    Past Medical History:  Diagnosis Date   Allergy    Arthritis    hand, neck   Back pain 09/11/2016   patient denies back pain but has neck pain   Bicuspid aortic valve    DEGENERATIVE DISC DISEASE, CERVICAL SPINE    Heart murmur    never has caused any problems   History of chicken pox    Hypertension    Migraines    last one 2 wks ago- allergy related    MITRAL VALVE PROLAPSE    PSA, INCREASED    TINNITUS, CHRONIC, BILATERAL    Past Surgical History:  Procedure Laterality Date   CERVICAL DISC SURGERY  12/2019   CERVICAL SPINE SURGERY     COLONOSCOPY  2012   [place] hx polyps   CYST REMOVAL NECK     And Face   NASAL SEPTOPLASTY W/ TURBINOPLASTY Bilateral 09/25/2019   Procedure: NASAL SEPTOPLASTY WITH TURBINATE REDUCTION;  Surgeon: [name], MD;  Location: [facility];  Service: ENT;  Laterality: Bilateral;   POLYPECTOMY     Colon   PROSTATE BIOPSY  06/2019   negative   SINUS ENDO WITH FUSION Bilateral 09/25/2019   Procedure: ENDOSCOPIC SINUS SURGERY  WITH FUSION NAVIGATION;  Surgeon: [name], MD;  Location: [facility];  Service: ENT;  Laterality: Bilateral;   Tonsillectome     WISDOM TOOTH EXTRACTION       Current Meds  Medication Sig   amLODipine (NORVASC) 10 MG tablet Take 1 tablet (10 mg total) by mouth daily.   cetirizine (ZYRTEC) 5 MG tablet Take 10 mg by mouth daily.   COVID-19 mRNA bivalent vaccine, Pfizer, injection Inject into the muscle.   diclofenac Sodium (VOLTAREN) 1 % GEL Apply 2 g topically 4 (four) times daily.   fluticasone (FLONASE) 50 MCG/ACT nasal spray Place 2 sprays into both nostrils daily.   losartan (COZAAR) 100 MG tablet Take 1 tablet (100 mg total) by mouth daily.   naproxen sodium (ALEVE) 220 MG  tablet Take 220 mg by mouth 2 (two) times daily as needed.   sodium chloride (OCEAN) 0.65 % SOLN nasal spray Place 1 spray into both nostrils as needed for congestion.     Allergies:   Patient has no known allergies.   Social History   Tobacco Use   Smoking status: Every Day    Packs/day: 1.00    Years: 40.00    Total pack years: 40.00    Types: Cigarettes   Smokeless tobacco: Never  Vaping Use   Vaping Use: Never used  Substance Use Topics   Alcohol use: Yes    Alcohol/week: 7.0 standard drinks of alcohol    Types: 7 Standard drinks or equivalent per week    Comment: 1 glass of scotch daily   Drug use: No     Family Hx: The patient's family history includes Breast cancer in his sister; Cancer in his maternal aunt, paternal aunt, and other family members; Colon cancer (age of onset: 48) in his mother; Diabetes in his father; Emphysema in his maternal grandfather and paternal grandfather; Liver cancer in his mother; Lung cancer in his mother; Lymphoma (age of onset: 14) in his father. There is no history of Esophageal cancer, Stomach cancer, or Rectal cancer.  ROS:   Please see the history of present illness.     All other systems reviewed and are negative.   Prior CV studies:   The following studies were reviewed today:  Echo 05/12/22 compared to 05/16/21   Labs/Other Tests and Data Reviewed:    EKG:   12/18/2022 NSR rate 59 normal 12/18/2022 NSR rate 62 normal minor voltage LVH   Recent Labs: 04/04/2022: ALT 21; BUN 17; Creatinine, Ser 0.98; Hemoglobin 16.2; Platelets 217.0; Potassium 5.1; Sodium 139   Recent Lipid Panel Lab Results  Component Value Date/Time   CHOL 162 04/04/2022 09:47 AM   TRIG 38.0 04/04/2022 09:47 AM   HDL 84.40 04/04/2022 09:47 AM   CHOLHDL 2 04/04/2022 09:47 AM   LDLCALC 70 04/04/2022 09:47 AM    Wt Readings from Last 3 Encounters:  12/18/22 170 lb (77.1 kg)  06/21/22 166 lb 12.8 oz (75.7 kg)  04/04/22 166 lb 12.8 oz (75.7 kg)      Objective:    Vital Signs:  BP 128/62 (BP Location: Left Arm, Patient Position: Sitting, Cuff Size: Normal)   Pulse 62   Ht 5' 9"$  (1.753 m)   Wt 170 lb (77.1 kg)   SpO2 98%   BMI 25.10 kg/m    Affect appropriate Healthy:  appears stated age HEENT: normal Neck supple with no adenopathy JVP normal no bruits no thyromegaly Lungs clear with no wheezing and good diaphragmatic motion Heart:  S1/S2 AS/AR murmur, no rub, gallop or click PMI normal Abdomen: benighn, BS positve, no tenderness, no AAA no bruit.  No HSM or HJR Distal pulses intact with no bruits No edema Neuro non-focal Skin warm and dry No muscular weakness   ASSESSMENT & PLAN:    Bicuspid AV:  Severe AR moderate AS LV compensated Signs of elevated EDP He is active, asymptomatic with low ED/ES dimensions Low normal functional capacity by cardiopulmonary stress test 06/26/22  Currently no indication for cath or AVR Update TTE July 2024  Allergies:  Continue zyrtec consider flonase  F/u primary  ETOH:  Encouraged more moderation in intake labs/LFTls with primary Smoking:  Counseled on smoking cessation for less than 10 minutes Has had script for Chantix CT 07/14/21 with no cancer Aortal 4.1 cm  will repeat next June when on medicare  HTN:  Continue high dose Cozaar Increase norvasc to 10 mg  ENT:  Post septoplasty had some sinusitis despite surgery f/u ENT [place] Aorta:  4.1 cm on lung CT 08/02/21 can re assess with updated CT for lungs   COVID-19 Education: The signs and symptoms of COVID-19 were discussed with the patient and how to seek care for testing (follow up with PCP or arrange E-visit).  The importance of social distancing was discussed today.  Medication Adjustments/Labs and Tests Ordered: Current medicines are reviewed at length with the patient today.  Concerns regarding medicines are outlined above.   Tests Ordered:  Lung cancer CT TTE July 2024 AR  Medication Changes: No orders of the defined  types were placed in this encounter.    Disposition:  Follow up 6 months   Signed, Jenkins Rouge, MD  12/18/2022 8:01 AM    [place]

## 2022-12-18 ENCOUNTER — Ambulatory Visit: Payer: 59 | Attending: Cardiovascular Disease | Admitting: Cardiovascular Disease

## 2022-12-18 ENCOUNTER — Encounter: Payer: Self-pay | Admitting: Cardiovascular Disease

## 2022-12-18 VITALS — BP 128/62 | HR 62 | Ht 69.0 in | Wt 170.0 lb

## 2022-12-18 DIAGNOSIS — I350 Nonrheumatic aortic (valve) stenosis: Secondary | ICD-10-CM | POA: Diagnosis not present

## 2022-12-18 DIAGNOSIS — I351 Nonrheumatic aortic (valve) insufficiency: Secondary | ICD-10-CM

## 2022-12-18 DIAGNOSIS — Z87891 Personal history of nicotine dependence: Secondary | ICD-10-CM | POA: Diagnosis not present

## 2022-12-18 DIAGNOSIS — Q231 Congenital insufficiency of aortic valve: Secondary | ICD-10-CM

## 2022-12-18 MED ORDER — AMLODIPINE BESYLATE 10 MG PO TABS: 10.0000 mg | ORAL_TABLET | Freq: Every day | ORAL | 3 refills | Status: DC

## 2022-12-18 MED ORDER — LOSARTAN POTASSIUM 100 MG PO TABS: 100.0000 mg | ORAL_TABLET | Freq: Every day | ORAL | 3 refills | Status: DC

## 2022-12-18 NOTE — Patient Instructions (Signed)
Medication Instructions:  Your physician recommends that you continue on your current medications as directed. Please refer to the Current Medication list given to you today.  *If you need a refill on your cardiac medications before your next appointment, please call your pharmacy*  Lab Work: If you have labs (blood work) drawn today and your tests are completely normal, you will receive your results only by: [place] (if you have MyChart) OR A paper copy in the mail If you have any lab test that is abnormal or we need to change your treatment, we will call you to review the results.  Testing/Procedures: Non-Cardiac CT scanning for lung cancer screening, (CAT scanning), is a noninvasive, special x-ray that produces cross-sectional images of the body using x-rays and a computer. CT scans help physicians diagnose and treat medical conditions. For some CT exams, a contrast material is used to enhance visibility in the area of the body being studied. CT scans provide greater clarity and reveal more details than regular x-ray exams.  Your physician has requested that you have an echocardiogram in July.  Echocardiography is a painless test that uses sound waves to create images of your heart. It provides your doctor with information about the size and shape of your heart and how well your heart's chambers and valves are working. This procedure takes approximately one hour. There are no restrictions for this procedure. Please do NOT wear cologne, perfume, aftershave, or lotions (deodorant is allowed). Please arrive 15 minutes prior to your appointment time.  Follow-Up: At [facility], you and your health needs are our priority.  As part of our continuing mission to provide you with exceptional heart care, we have created designated Provider Care Teams.  These Care Teams include your primary Cardiologist (physician) and Advanced Practice Providers (APPs -  Physician Assistants and Nurse  Practitioners) who all work together to provide you with the care you need, when you need it.  We recommend signing up for the patient portal called "MyChart".  Sign up information is provided on this After Visit Summary.  MyChart is used to connect with patients for Virtual Visits (Telemedicine).  Patients are able to view lab/test results, encounter notes, upcoming appointments, etc.  Non-urgent messages can be sent to your provider as well.   To learn more about what you can do with MyChart, go to [email or web address].    Your next appointment:   6 month(s)  Provider:   Jenkins Rouge, MD

## 2022-12-19 ENCOUNTER — Ambulatory Visit
Admission: RE | Admit: 2022-12-19 | Discharge: 2022-12-19 | Disposition: A | Discharge status: Home / Self Care | Payer: 59 | Source: Ambulatory Visit | Attending: Urgent Care | Admitting: Urgent Care

## 2022-12-19 VITALS — BP 120/61 | HR 63 | Temp 98.2°F | Resp 16

## 2022-12-19 DIAGNOSIS — F17200 Nicotine dependence, unspecified, uncomplicated: Secondary | ICD-10-CM

## 2022-12-19 DIAGNOSIS — R519 Headache, unspecified: Secondary | ICD-10-CM | POA: Diagnosis not present

## 2022-12-19 DIAGNOSIS — J309 Allergic rhinitis, unspecified: Secondary | ICD-10-CM

## 2022-12-19 DIAGNOSIS — J329 Chronic sinusitis, unspecified: Secondary | ICD-10-CM | POA: Diagnosis not present

## 2022-12-19 MED ORDER — AMOXICILLIN 875 MG PO TABS: 875.0000 mg | ORAL_TABLET | Freq: Two times a day (BID) | ORAL | 0 refills | Status: DC

## 2022-12-19 MED ORDER — PREDNISONE 20 MG PO TABS: ORAL_TABLET | ORAL | 0 refills | Status: DC

## 2022-12-19 MED ORDER — FEXOFENADINE HCL 180 MG PO TABS: 180.0000 mg | ORAL_TABLET | Freq: Every day | ORAL | 0 refills | Status: DC

## 2022-12-19 NOTE — ED Provider Notes (Signed)
[facility]  Note:  This document was prepared using Systems analyst and may include unintentional dictation errors.  MRN: [id number] DOB: [birth year]  Subjective:   David Cunningham is a 67 y.o. male presenting for 2-3 day history of left sinus pain, sinus pressure, oral pain. Has actually been battling his sinuses for the past 2 months. Has had persistent sinus congestion, sinus drainage. Has had some coughing. No fever, chest pain, shob, wheezing. Smokes 1ppd. Has history of sinus infections, last episode was 08/2022, underwent course of doxycycline. Use sinus allergy medicine daily. Has had sinus surgery before due to significant recurrent sinus infections.   No current facility-administered medications for this encounter.  Current Outpatient Medications:    amLODipine (NORVASC) 10 MG tablet, Take 1 tablet (10 mg total) by mouth daily., Disp: 90 tablet, Rfl: 3   cetirizine (ZYRTEC) 5 MG tablet, Take 10 mg by mouth daily., Disp: , Rfl:    diclofenac Sodium (VOLTAREN) 1 % GEL, Apply 2 g topically 4 (four) times daily., Disp: 100 g, Rfl: 1   fluticasone (FLONASE) 50 MCG/ACT nasal spray, Place 2 sprays into both nostrils daily., Disp: 16 g, Rfl: 2   losartan (COZAAR) 100 MG tablet, Take 1 tablet (100 mg total) by mouth daily., Disp: 90 tablet, Rfl: 3   naproxen sodium (ALEVE) 220 MG tablet, Take 220 mg by mouth 2 (two) times daily as needed., Disp: , Rfl:    sodium chloride (OCEAN) 0.65 % SOLN nasal spray, Place 1 spray into both nostrils as needed for congestion., Disp: , Rfl:    No Known Allergies  Past Medical History:  Diagnosis Date   Allergy    Arthritis    hand, neck   Back pain 09/11/2016   patient denies back pain but has neck pain   Bicuspid aortic valve    DEGENERATIVE DISC DISEASE, CERVICAL SPINE    Heart murmur    never has caused any problems   History of chicken pox    Hypertension    Migraines    last one 2 wks ago- allergy  related    MITRAL VALVE PROLAPSE    PSA, INCREASED    TINNITUS, CHRONIC, BILATERAL      Past Surgical History:  Procedure Laterality Date   CERVICAL DISC SURGERY  12/2019   CERVICAL SPINE SURGERY     COLONOSCOPY  2012   [place] hx polyps   CYST REMOVAL NECK     And Face   NASAL SEPTOPLASTY W/ TURBINOPLASTY Bilateral 09/25/2019   Procedure: NASAL SEPTOPLASTY WITH TURBINATE REDUCTION;  Surgeon: [name], MD;  Location: [facility];  Service: ENT;  Laterality: Bilateral;   POLYPECTOMY     Colon   PROSTATE BIOPSY  06/2019   negative   SINUS ENDO WITH FUSION Bilateral 09/25/2019   Procedure: ENDOSCOPIC SINUS SURGERY WITH FUSION NAVIGATION;  Surgeon: [name], MD;  Location: [facility];  Service: ENT;  Laterality: Bilateral;   Tonsillectome     WISDOM TOOTH EXTRACTION      Family History  Problem Relation Age of Onset   Colon cancer Mother 42       Deceased   Lung cancer Mother    Liver cancer Mother    Breast cancer Sister    Lymphoma Father 35       Deceased   Diabetes Father    Cancer Other        Paternal Grandparents   Cancer Other  Maternal Grandparents   Emphysema Paternal Grandfather    Emphysema Maternal Grandfather    Cancer Paternal Aunt    Cancer Maternal Aunt    Esophageal cancer Neg Hx    Stomach cancer Neg Hx    Rectal cancer Neg Hx     Social History   Tobacco Use   Smoking status: Every Day    Packs/day: 1.00    Years: 40.00    Total pack years: 40.00    Types: Cigarettes   Smokeless tobacco: Never  Vaping Use   Vaping Use: Never used  Substance Use Topics   Alcohol use: Yes    Alcohol/week: 14.0 standard drinks of alcohol    Types: 7 Glasses of wine, 7 Shots of liquor per week    Comment: 1 glass of scotch daily   Drug use: No    ROS   Objective:   Vitals: BP 120/61   Pulse 63   Temp 98.2 F (36.8 C) (Oral)   Resp 16   SpO2 98%   Physical Exam Constitutional:       General: He is not in acute distress.    Appearance: Normal appearance. He is well-developed and normal weight. He is not ill-appearing, toxic-appearing or diaphoretic.  HENT:     Head: Normocephalic and atraumatic.     Right Ear: Tympanic membrane, ear canal and external ear normal. No drainage, swelling or tenderness. No middle ear effusion. There is no impacted cerumen. Tympanic membrane is not erythematous or bulging.     Left Ear: Tympanic membrane, ear canal and external ear normal. No drainage, swelling or tenderness.  No middle ear effusion. There is no impacted cerumen. Tympanic membrane is not erythematous or bulging.     Nose: Congestion and rhinorrhea present.     Comments: Left-sided maxillary sinus tenderness.    Mouth/Throat:     Mouth: Mucous membranes are moist.     Pharynx: No oropharyngeal exudate or posterior oropharyngeal erythema.  Eyes:     General: No scleral icterus.       Right eye: No discharge.        Left eye: No discharge.     Extraocular Movements: Extraocular movements intact.     Conjunctiva/sclera: Conjunctivae normal.  Cardiovascular:     Rate and Rhythm: Normal rate and regular rhythm.     Heart sounds: Normal heart sounds. No murmur heard.    No friction rub. No gallop.  Pulmonary:     Effort: Pulmonary effort is normal. No respiratory distress.     Breath sounds: Normal breath sounds. No stridor. No wheezing, rhonchi or rales.  Musculoskeletal:     Cervical back: Normal range of motion and neck supple. No rigidity. No muscular tenderness.  Neurological:     General: No focal deficit present.     Mental Status: He is alert and oriented to person, place, and time.  Psychiatric:        Mood and Affect: Mood normal.        Behavior: Behavior normal.        Thought Content: Thought content normal.        Judgment: Judgment normal.     Assessment and Plan :   PDMP not reviewed this encounter.  1. Recurrent sinusitis   2. Facial pain   3.  Smoker   4. Allergic rhinitis, unspecified seasonality, unspecified trigger     Will use a max for recurrent sinusitis.  Given his chronic allergic rhinitis, smoking recommended oral prednisone  course.  Switch from Zyrtec to Allegra and maintain this long-term.  Hold off on the nasal sprays until he completely recovers from his current infection. Deferred imaging given clear cardiopulmonary exam, hemodynamically stable vital signs. Counseled patient on potential for adverse effects with medications prescribed/recommended today, ER and return-to-clinic precautions discussed, patient verbalized understanding.    Jaynee Eagles, PA-C 12/19/22 1038

## 2022-12-27 ENCOUNTER — Ambulatory Visit (HOSPITAL_BASED_OUTPATIENT_CLINIC_OR_DEPARTMENT_OTHER)
Admission: RE | Admit: 2022-12-27 | Discharge: 2022-12-27 | Disposition: A | Discharge status: Home / Self Care | Payer: 59 | Source: Ambulatory Visit | Attending: Cardiovascular Disease | Admitting: Cardiovascular Disease

## 2022-12-27 DIAGNOSIS — Z87891 Personal history of nicotine dependence: Secondary | ICD-10-CM | POA: Diagnosis present

## 2022-12-27 DIAGNOSIS — Q231 Congenital insufficiency of aortic valve: Secondary | ICD-10-CM

## 2022-12-27 DIAGNOSIS — I350 Nonrheumatic aortic (valve) stenosis: Secondary | ICD-10-CM | POA: Diagnosis present

## 2022-12-27 DIAGNOSIS — I351 Nonrheumatic aortic (valve) insufficiency: Secondary | ICD-10-CM | POA: Diagnosis present

## 2022-12-28 ENCOUNTER — Encounter (HOSPITAL_BASED_OUTPATIENT_CLINIC_OR_DEPARTMENT_OTHER): Payer: Self-pay

## 2022-12-28 ENCOUNTER — Telehealth: Payer: Self-pay

## 2022-12-28 DIAGNOSIS — J849 Interstitial pulmonary disease, unspecified: Secondary | ICD-10-CM

## 2022-12-28 DIAGNOSIS — R911 Solitary pulmonary nodule: Secondary | ICD-10-CM

## 2022-12-28 NOTE — Telephone Encounter (Signed)
The patient has been notified of the result and verbalized understanding.  All questions (if any) were answered. David Poe Rodriguez Camp, RN 12/28/2022 9:38 AM   Placed referral to pulmonology.

## 2022-12-28 NOTE — Telephone Encounter (Signed)
-----   Message from Josue Hector, MD sent at 12/27/2022  3:14 PM EST ----- Aneurysm stable 4.3 cm has some interstitial lung dx and new 5 mm nodule refer to pulmonary for consult

## 2023-01-25 ENCOUNTER — Ambulatory Visit
Admission: EM | Admit: 2023-01-25 | Discharge: 2023-01-25 | Disposition: A | Discharge status: Home / Self Care | Payer: 59 | Attending: Urgent Care | Admitting: Urgent Care

## 2023-01-25 DIAGNOSIS — R519 Headache, unspecified: Secondary | ICD-10-CM

## 2023-01-25 DIAGNOSIS — J310 Chronic rhinitis: Secondary | ICD-10-CM | POA: Diagnosis not present

## 2023-01-25 DIAGNOSIS — Z9889 Other specified postprocedural states: Secondary | ICD-10-CM | POA: Diagnosis not present

## 2023-01-25 DIAGNOSIS — F17200 Nicotine dependence, unspecified, uncomplicated: Secondary | ICD-10-CM

## 2023-01-25 DIAGNOSIS — J342 Deviated nasal septum: Secondary | ICD-10-CM | POA: Diagnosis not present

## 2023-01-25 MED ORDER — AMOXICILLIN-POT CLAVULANATE 875-125 MG PO TABS: 1.0000 | ORAL_TABLET | Freq: Two times a day (BID) | ORAL | 0 refills | Status: DC

## 2023-01-25 MED ORDER — DEXAMETHASONE SODIUM PHOSPHATE 10 MG/ML IJ SOLN
10.0000 mg | Freq: Once | INTRAMUSCULAR | Status: AC
  Administered 2023-01-25: 10 mg via INTRAMUSCULAR

## 2023-01-25 MED ORDER — PREDNISONE 20 MG PO TABS: ORAL_TABLET | ORAL | 0 refills | Status: DC

## 2023-01-25 MED ORDER — FEXOFENADINE HCL 180 MG PO TABS: 180.0000 mg | ORAL_TABLET | Freq: Every day | ORAL | 0 refills | Status: AC

## 2023-01-25 NOTE — ED Provider Notes (Signed)
[facility]  Note:  This document was prepared using Systems analyst and may include unintentional dictation errors.  MRN: [id number] DOB: 1956/03/23  Subjective:   David Cunningham is a 67 y.o. male presenting for 1 day history of recurrent sinus pain, facial pain, dental pain, painful chewing from the upper side of his mouth.  Patient was last seen 12/19/2022 and started on amoxicillin for recurrent sinusitis.  He also underwent a steroid course.  Prior to that he had undergone a course of doxycycline in October 2023 from a video visit.  Feels like he never cleared the infection from February. Still smokes 1ppd.   No current facility-administered medications for this encounter.  Current Outpatient Medications:    amLODipine (NORVASC) 10 MG tablet, Take 1 tablet (10 mg total) by mouth daily., Disp: 90 tablet, Rfl: 3   amoxicillin (AMOXIL) 875 MG tablet, Take 1 tablet (875 mg total) by mouth 2 (two) times daily., Disp: 20 tablet, Rfl: 0   diclofenac Sodium (VOLTAREN) 1 % GEL, Apply 2 g topically 4 (four) times daily., Disp: 100 g, Rfl: 1   fexofenadine (ALLEGRA) 180 MG tablet, Take 1 tablet (180 mg total) by mouth daily., Disp: 90 tablet, Rfl: 0   fluticasone (FLONASE) 50 MCG/ACT nasal spray, Place 2 sprays into both nostrils daily., Disp: 16 g, Rfl: 2   losartan (COZAAR) 100 MG tablet, Take 1 tablet (100 mg total) by mouth daily., Disp: 90 tablet, Rfl: 3   naproxen sodium (ALEVE) 220 MG tablet, Take 220 mg by mouth 2 (two) times daily as needed., Disp: , Rfl:    predniSONE (DELTASONE) 20 MG tablet, Take 2 tablets daily with breakfast., Disp: 10 tablet, Rfl: 0   sodium chloride (OCEAN) 0.65 % SOLN nasal spray, Place 1 spray into both nostrils as needed for congestion., Disp: , Rfl:    No Known Allergies  Past Medical History:  Diagnosis Date   Allergy    Arthritis    hand, neck   Back pain 09/11/2016   patient denies back pain but has neck pain    Bicuspid aortic valve    DEGENERATIVE DISC DISEASE, CERVICAL SPINE    Heart murmur    never has caused any problems   History of chicken pox    Hypertension    Migraines    last one 2 wks ago- allergy related    MITRAL VALVE PROLAPSE    PSA, INCREASED    TINNITUS, CHRONIC, BILATERAL      Past Surgical History:  Procedure Laterality Date   CERVICAL DISC SURGERY  12/2019   CERVICAL SPINE SURGERY     COLONOSCOPY  2012   [place] hx polyps   CYST REMOVAL NECK     And Face   NASAL SEPTOPLASTY W/ TURBINOPLASTY Bilateral 09/25/2019   Procedure: NASAL SEPTOPLASTY WITH TURBINATE REDUCTION;  Surgeon: [name], MD;  Location: [facility];  Service: ENT;  Laterality: Bilateral;   POLYPECTOMY     Colon   PROSTATE BIOPSY  06/2019   negative   SINUS ENDO WITH FUSION Bilateral 09/25/2019   Procedure: ENDOSCOPIC SINUS SURGERY WITH FUSION NAVIGATION;  Surgeon: [name], MD;  Location: [facility];  Service: ENT;  Laterality: Bilateral;   Tonsillectome     WISDOM TOOTH EXTRACTION      Family History  Problem Relation Age of Onset   Colon cancer Mother 59       Deceased   Lung cancer Mother  Liver cancer Mother    Breast cancer Sister    Lymphoma Father 19       Deceased   Diabetes Father    Cancer Other        Paternal Grandparents   Cancer Other        Maternal Grandparents   Emphysema Paternal Grandfather    Emphysema Maternal Grandfather    Cancer Paternal Aunt    Cancer Maternal Aunt    Esophageal cancer Neg Hx    Stomach cancer Neg Hx    Rectal cancer Neg Hx     Social History   Tobacco Use   Smoking status: Every Day    Packs/day: 1.00    Years: 40.00    Additional pack years: 0.00    Total pack years: 40.00    Types: Cigarettes   Smokeless tobacco: Never  Vaping Use   Vaping Use: Never used  Substance Use Topics   Alcohol use: Yes    Alcohol/week: 14.0 standard drinks of alcohol    Types: 7 Glasses of wine, 7  Shots of liquor per week    Comment: 1 glass of scotch daily   Drug use: No    ROS   Objective:   Vitals: BP 137/68 (BP Location: Right Arm)   Pulse 68   Temp 98.3 F (36.8 C) (Oral)   Resp 18   SpO2 96%   Physical Exam Constitutional:      General: He is not in acute distress.    Appearance: Normal appearance. He is well-developed and normal weight. He is not ill-appearing, toxic-appearing or diaphoretic.  HENT:     Head: Normocephalic and atraumatic.     Right Ear: Tympanic membrane, ear canal and external ear normal. No drainage, swelling or tenderness. No middle ear effusion. There is no impacted cerumen. Tympanic membrane is not erythematous or bulging.     Left Ear: Tympanic membrane, ear canal and external ear normal. No drainage, swelling or tenderness.  No middle ear effusion. There is no impacted cerumen. Tympanic membrane is not erythematous or bulging.     Nose: Congestion present. No rhinorrhea.     Comments: Maxillary sinus tenderness.     Mouth/Throat:     Mouth: Mucous membranes are moist.     Pharynx: No oropharyngeal exudate or posterior oropharyngeal erythema.     Comments: Areas of gingival recession for the upper teeth with caries.  Eyes:     General: No scleral icterus.       Right eye: No discharge.        Left eye: No discharge.     Extraocular Movements: Extraocular movements intact.     Conjunctiva/sclera: Conjunctivae normal.  Cardiovascular:     Rate and Rhythm: Normal rate.  Pulmonary:     Effort: Pulmonary effort is normal.  Musculoskeletal:     Cervical back: Normal range of motion and neck supple. No rigidity. No muscular tenderness.  Neurological:     General: No focal deficit present.     Mental Status: He is alert and oriented to person, place, and time.  Psychiatric:        Mood and Affect: Mood normal.        Behavior: Behavior normal.        Thought Content: Thought content normal.        Judgment: Judgment normal.    IM  dexamethasone 10mg  administered in clinic.    Assessment and Plan :   PDMP not reviewed this encounter.  1. Chronic rhinitis   2. Facial pain   3. History of nasal surgery   4. Deviated septum   5. Heavy smoker     Patient has acute on chronic rhinitis and recommended Augmentin especially given the dental pain involvement.  Recommend switching from Zyrtec to Allegra.  Patient requested a steroid injection and therefore we did dexamethasone 10 mg as above.  Start oral prednisone course tomorrow.  Follow-up with an ENT specialist and/or dental specialist soon as possible. Counseled patient on potential for adverse effects with medications prescribed/recommended today, ER and return-to-clinic precautions discussed, patient verbalized understanding.    Jaynee Eagles, PA-C 01/25/23 1012

## 2023-01-25 NOTE — ED Triage Notes (Signed)
Pt presents with c/o left side facial pain, states he cannot chew on the left side and states it has progressed over night.

## 2023-01-25 NOTE — Discharge Instructions (Addendum)
Make sure you follow up with ENT. Please start treatment for recurrent sinus infection that may be involving your teeth with Augmentin.

## 2023-01-30 ENCOUNTER — Ambulatory Visit (HOSPITAL_BASED_OUTPATIENT_CLINIC_OR_DEPARTMENT_OTHER): Payer: 59

## 2023-01-31 ENCOUNTER — Ambulatory Visit (HOSPITAL_BASED_OUTPATIENT_CLINIC_OR_DEPARTMENT_OTHER): Payer: 59 | Admitting: Pulmonary Disease

## 2023-01-31 ENCOUNTER — Encounter (HOSPITAL_BASED_OUTPATIENT_CLINIC_OR_DEPARTMENT_OTHER): Payer: Self-pay | Admitting: Pulmonary Disease

## 2023-01-31 VITALS — BP 120/60 | HR 63 | Temp 98.2°F | Ht 69.0 in | Wt 171.8 lb

## 2023-01-31 DIAGNOSIS — J849 Interstitial pulmonary disease, unspecified: Secondary | ICD-10-CM

## 2023-01-31 DIAGNOSIS — J432 Centrilobular emphysema: Secondary | ICD-10-CM

## 2023-01-31 NOTE — Progress Notes (Unsigned)
Subjective:   PATIENT ID: David Cunningham GENDER: male DOB: 01/22/1956, MRN: [id number]  No chief complaint on file.   Reason for Visit: New consult for ILD  Mr. Edouard Kornreich is a 67 year old male active smoker with allergic rhinitis, hx septoplasty in 2020, bicuspid AV, chronic diastolic heart failure, HTN who presents for evaluation for ILD.  He is enrolled in [place] Chest Lung screen program and recent imaging demonstrated mild centrilobular and paraseptal emphysema and mild subpleural reticulation with mild ground glass, unchanged bilateral pulmonary nodules including anterior pleural nodule.  He reports cough since December due to probable RSV and also covid in Feb. Cough has resolved. Denies shortness of breath. He goes to the gym and works out 3x a week including 30 min of cardio and weight training. Before this illness he reports intermittent cough that would be productive in the morning but more dry in the afternoons but not limiting his activities. After his septoplasty he gets sinus infections 2-3 times. He has arthritis in his thumbs and neck bilaterally.  Social History: Active smoker. Started in 1974 (15 years). Smokes 1 ppd. Has tried gum in the past. Previously worked in Engineer, mining, Press photographer, Mudlogger Previously exposed/lived in damp mold environments Environmental exposures: None  I have personally reviewed patient's past medical/family/social history, allergies, current medications.  Past Medical History:  Diagnosis Date   Allergy    Arthritis    hand, neck   Back pain 09/11/2016   patient denies back pain but has neck pain   Bicuspid aortic valve    DEGENERATIVE DISC DISEASE, CERVICAL SPINE    Heart murmur    never has caused any problems   History of chicken pox    Hypertension    Migraines    last one 2 wks ago- allergy related    MITRAL VALVE PROLAPSE    PSA, INCREASED    TINNITUS, CHRONIC, BILATERAL      Family History  Problem Relation Age of Onset    Colon cancer Mother 56       Deceased   Lung cancer Mother    Liver cancer Mother    Breast cancer Sister    Lymphoma Father 26       Deceased   Diabetes Father    Cancer Other        Paternal Grandparents   Cancer Other        Maternal Grandparents   Emphysema Paternal Grandfather    Emphysema Maternal Grandfather    Cancer Paternal Aunt    Cancer Maternal Aunt    Esophageal cancer Neg Hx    Stomach cancer Neg Hx    Rectal cancer Neg Hx      Social History   Occupational History   Occupation: Retired  Tobacco Use   Smoking status: Every Day    Packs/day: 1.00    Years: 40.00    Additional pack years: 0.00    Total pack years: 40.00    Types: Cigarettes   Smokeless tobacco: Never  Vaping Use   Vaping Use: Never used  Substance and Sexual Activity   Alcohol use: Yes    Alcohol/week: 14.0 standard drinks of alcohol    Types: 7 Glasses of wine, 7 Shots of liquor per week    Comment: 1 glass of scotch daily   Drug use: No   Sexual activity: Yes    Partners: Female    Comment: wife    No Known Allergies   Outpatient Medications Prior  to Visit  Medication Sig Dispense Refill   amLODipine (NORVASC) 10 MG tablet Take 1 tablet (10 mg total) by mouth daily. 90 tablet 3   amoxicillin-clavulanate (AUGMENTIN) 875-125 MG tablet Take 1 tablet by mouth 2 (two) times daily. 20 tablet 0   diclofenac Sodium (VOLTAREN) 1 % GEL Apply 2 g topically 4 (four) times daily. 100 g 1   fexofenadine (ALLEGRA) 180 MG tablet Take 1 tablet (180 mg total) by mouth daily. 90 tablet 0   fluticasone (FLONASE) 50 MCG/ACT nasal spray Place 2 sprays into both nostrils daily. 16 g 2   losartan (COZAAR) 100 MG tablet Take 1 tablet (100 mg total) by mouth daily. 90 tablet 3   naproxen sodium (ALEVE) 220 MG tablet Take 220 mg by mouth 2 (two) times daily as needed.     predniSONE (DELTASONE) 20 MG tablet Take 2 tablets daily with breakfast. 10 tablet 0   sodium chloride (OCEAN) 0.65 % SOLN nasal spray  Place 1 spray into both nostrils as needed for congestion.     No facility-administered medications prior to visit.    ROS   Objective:   Vitals:   01/31/23 0822  BP: 120/60  Pulse: 63  Temp: 98.2 F (36.8 C)  TempSrc: Oral  SpO2: 100%  Weight: 171 lb 12.8 oz (77.9 kg)  Height: 5\' 9"  (1.753 m)   SpO2: 100 % O2 Device: None (Room air)  Physical Exam: General: Well-appearing, no acute distress HENT: [place], AT Eyes: EOMI, no scleral icterus Respiratory: ***Clear to auscultation bilaterally.  No crackles, wheezing or rales Cardiovascular: RRR, -M/R/G, no JVD Extremities:-Edema,-tenderness Neuro: AAO x4, CNII-XII grossly intact Psych: Normal mood, normal affect  Data Reviewed:  Imaging: CT Chest Lung Screen 12/27/22 - mild centrilobular and paraseptal emphysema and mild subpleural reticulation with mild ground glass, unchanged bilateral pulmonary nodules  PFT: CPET 06/26/22  FVC 3.29 (75%) FEV1 2.61 (78%) Ratio 102  Interpretation: CPET with low-normal functional capacity. Normal spirometry   Labs: CBC    Component Value Date/Time   WBC 7.8 04/04/2022 0947   RBC 4.79 04/04/2022 0947   HGB 16.2 04/04/2022 0947   HCT 47.6 04/04/2022 0947   PLT 217.0 04/04/2022 0947   MCV 99.4 04/04/2022 0947   MCHC 34.0 04/04/2022 0947   RDW 13.8 04/04/2022 0947   LYMPHSABS 2.5 04/04/2022 0947   MONOABS 0.6 04/04/2022 0947   EOSABS 0.2 04/04/2022 0947   BASOSABS 0.1 04/04/2022 0947        Assessment & Plan:   Discussion: HPI  Mild ILD --At next visit plan for initial ILD labs --ORDER pulmonary function tests   Post-viral cough - improved/resolved --No further management  Emphysema/COPD --Pulmonary function test as above --No indication for bronchodilators  Health Maintenance Immunization History  Administered Date(s) Administered   Influenza Split 08/23/2012   Influenza,inj,Quad PF,6+ Mos 09/09/2013, 10/20/2015, 09/04/2016   PFIZER(Purple Top)SARS-COV-2  Vaccination 01/19/2020, 02/09/2020, 08/25/2021   Pfizer Covid-19 Vaccine Bivalent Booster 32yrs & up 08/02/2021   Td 11/06/2006   Tdap 12/29/2016   Zoster Recombinat (Shingrix) 04/04/2022, 06/06/2022   CT Lung Screen***  No orders of the defined types were placed in this encounter. No orders of the defined types were placed in this encounter.   No follow-ups on file.  I have spent a total time of***-minutes on the day of the appointment reviewing prior documentation, coordinating care and discussing medical diagnosis and plan with the patient/family. Imaging, labs and tests included in this note have been reviewed and  interpreted independently by me.  [place], MD [facility] 01/31/2023 8:45 AM  Office Number [phone]

## 2023-01-31 NOTE — Patient Instructions (Signed)
Mild ILD --At next visit plan for initial ILD labs --ORDER pulmonary function tests   Post-viral cough - improved/resolved --No further management  Emphysema/COPD --Pulmonary function test as above --No indication for bronchodilators

## 2023-02-01 ENCOUNTER — Encounter (HOSPITAL_BASED_OUTPATIENT_CLINIC_OR_DEPARTMENT_OTHER): Payer: Self-pay | Admitting: Pulmonary Disease

## 2023-02-01 DIAGNOSIS — J849 Interstitial pulmonary disease, unspecified: Secondary | ICD-10-CM | POA: Insufficient documentation

## 2023-02-14 ENCOUNTER — Ambulatory Visit: Payer: 59 | Admitting: Medical

## 2023-02-14 VITALS — BP 148/56 | HR 66 | Temp 98.1°F | Resp 18 | Ht 69.0 in | Wt 169.2 lb

## 2023-02-14 DIAGNOSIS — R972 Elevated prostate specific antigen [PSA]: Secondary | ICD-10-CM

## 2023-02-14 DIAGNOSIS — R739 Hyperglycemia, unspecified: Secondary | ICD-10-CM

## 2023-02-14 DIAGNOSIS — R300 Dysuria: Secondary | ICD-10-CM

## 2023-02-14 LAB — COMPREHENSIVE METABOLIC PANEL
ALT: 20 U/L (ref 0–53)
AST: 20 U/L (ref 0–37)
Albumin: 4.5 g/dL (ref 3.5–5.2)
Alkaline Phosphatase: 61 U/L (ref 39–117)
BUN: 19 mg/dL (ref 6–23)
CO2: 28 mEq/L (ref 19–32)
Calcium: 9.4 mg/dL (ref 8.4–10.5)
Chloride: 102 mEq/L (ref 96–112)
Creatinine, Ser: 0.92 mg/dL (ref 0.40–1.50)
GFR: 87.72 mL/min (ref >60.00)
Glucose, Bld: 89 mg/dL (ref 70–99)
Potassium: 4.6 mEq/L (ref 3.5–5.1)
Sodium: 139 mEq/L (ref 135–145)
Total Bilirubin: 0.7 mg/dL (ref 0.2–1.2)
Total Protein: 6.9 g/dL (ref 6.0–8.3)

## 2023-02-14 LAB — POCT URINALYSIS DIPSTICK
Bilirubin, UA: NEGATIVE
Blood, UA: NEGATIVE
Glucose, UA: NEGATIVE
Ketones, UA: NEGATIVE
Nitrite, UA: NEGATIVE
Protein, UA: NEGATIVE
Spec Grav, UA: 1.025 (ref 1.010–1.025)
Urobilinogen, UA: 0.2 E.U./dL
pH, UA: 6 (ref 5.0–8.0)

## 2023-02-14 LAB — HEMOGLOBIN A1C: Hgb A1c MFr Bld: 5.4 % (ref 4.6–6.5)

## 2023-02-14 LAB — PSA: PSA: 10.68 ng/mL — ABNORMAL HIGH (ref 0.10–4.00)

## 2023-02-14 MED ORDER — SULFAMETHOXAZOLE-TRIMETHOPRIM 800-160 MG PO TABS: 1.0000 | ORAL_TABLET | Freq: Two times a day (BID) | ORAL | 0 refills | Status: DC

## 2023-02-14 MED ORDER — CEFTRIAXONE SODIUM 1 G IJ SOLR
1.0000 g | Freq: Once | INTRAMUSCULAR | Status: AC
  Administered 2023-02-14: 1 g via INTRAMUSCULAR

## 2023-02-14 NOTE — Addendum Note (Signed)
Addended by: Thelma Barge D on: 02/14/2023 12:32 PM   Modules accepted: Orders

## 2023-02-14 NOTE — Patient Instructions (Addendum)
Dysuria and  Elevated PSA in past. Bactrim ds antibiotic - POCT Urinalysis Dipstick - Urine Culture - PSA   Elevated blood sugar - Hemoglobin A1c - Comp Met (CMET)  Consider prostatitis vs uti. With hx of increased psa may be referring you back to urologist.  For nasal congestion recommend getting back on flonase nasal spray. Rocephin 1 gram has coverage for sinus infection and urinary tract. Bactrim has coverage as well.  If tooth pain persists despite the above recommend see dentist.  Follow up in 10 days or sooner if needed

## 2023-02-14 NOTE — Progress Notes (Signed)
   Subjective:    Patient ID: David Cunningham, male    DOB: [birth year], 67 y.o.   MRN: [id number]  HPI  Pt states on Sunday more frequent urination with some burning. No fever, no chills or sweating. No perineum pain.   Frequency all day. About 5-6 times a day. Recently got up at 12:30 to urinate which is unusual.  Pt had elevated psa in past. Pt has seen Dr. Wilson Singer urologist in past. States last seen in June 2023. Biopsy about 3 years ago and states was negative.    Review of Systems  Constitutional:  Negative for chills, fatigue and fever.  HENT:  Positive for congestion.        2 ED visits for rhinitis and sinus pressure. Pt given last rx of augmentin. He never took the augmentin. Last 2 days nasal congestion returned. Some left upper teeth pain.   Respiratory:  Negative for chest tightness, shortness of breath and wheezing.   Cardiovascular:  Negative for chest pain and palpitations.  Gastrointestinal:  Negative for abdominal pain and blood in stool.  Genitourinary:        Pt declines std screen.       Objective:   Physical Exam  General Mental Status- Alert. General Appearance- Not in acute distress.   Skin General: Color- Normal Color. Moisture- Normal Moisture.  Neck Carotid Arteries- Normal color. Moisture- Normal Moisture. No carotid bruits. No JVD.  Chest and Lung Exam Auscultation: Breath Sounds:-Normal.  Cardiovascular Auscultation:Rythm- Regular. Murmurs & Other Heart Sounds:Auscultation of the heart reveals- No Murmurs.  Abdomen Inspection:-Inspeection Normal. Palpation/Percussion:Note:No mass. Palpation and Percussion of the abdomen reveal- Non Tender, Non Distended + BS, no rebound or guarding.   Neurologic Cranial Nerve exam:- CN III-XII intact(No nystagmus), symmetric smile. Strength:- 5/5 equal and symmetric strength both upper and lower extremities.   Back- no cva tenderness      Assessment & Plan:   Patient Instructions  Dysuria and   Elevated PSA in past. Bactrim ds antibiotic - POCT Urinalysis Dipstick - Urine Culture - PSA   Elevated blood sugar - Hemoglobin A1c - Comp Met (CMET)  Consider prostatitis vs uti. With hx of increased psa may be referring you back to urologist.  For nasal congestion recommend getting back on flonase nasal spray. Rocephin 1 gram has coverage for sinus infection and urinary tract. Bactrim has coverage as well.  If tooth pain persists despite the above recommend see dentist.  Follow up in 10 days or sooner if needed   [employer], PA-C

## 2023-02-15 LAB — URINE CULTURE
MICRO NUMBER:: 14806424
Result:: NO GROWTH
SPECIMEN QUALITY:: ADEQUATE

## 2023-02-17 NOTE — Addendum Note (Signed)
Addended by: Gwenevere Abbot on: 02/17/2023 07:47 AM   Modules accepted: Orders

## 2023-03-13 ENCOUNTER — Encounter: Payer: Self-pay | Admitting: Medical

## 2023-03-25 ENCOUNTER — Ambulatory Visit
Admission: EM | Admit: 2023-03-25 | Discharge: 2023-03-25 | Disposition: A | Discharge status: Home / Self Care | Payer: 59 | Attending: Nurse Practitioner | Admitting: Nurse Practitioner

## 2023-03-25 DIAGNOSIS — J320 Chronic maxillary sinusitis: Secondary | ICD-10-CM | POA: Diagnosis not present

## 2023-03-25 MED ORDER — DEXAMETHASONE SODIUM PHOSPHATE 10 MG/ML IJ SOLN
10.0000 mg | Freq: Once | INTRAMUSCULAR | Status: AC
  Administered 2023-03-25: 10 mg via INTRAMUSCULAR

## 2023-03-25 MED ORDER — PREDNISONE 20 MG PO TABS: 40.0000 mg | ORAL_TABLET | Freq: Every day | ORAL | 0 refills | Status: AC

## 2023-03-25 NOTE — ED Provider Notes (Signed)
[facility]    CSN: [id number] Arrival date & time: 03/25/23  1326      History   Chief Complaint Chief Complaint  Patient presents with   Facial Pain    Sinus Pain - Entered by patient    HPI David Cunningham is a 67 y.o. male  presents for evaluation of URI symptoms for 4 days. Patient reports associated symptoms of left-sided maxillary sinus pressure/pain with aching teeth, postnasal drip, cough/congestion. Denies N/V/D, fevers, ear pain, sore throat, body aches, shortness of breath. Patient does have a hx of smoking and interstitial lung disease .No known sick contacts.  Patient reports he has been having chronic sinus issues for 3 months that wax and wane with treatment.  States he seen ENT in the past and has had sinus surgery.  He was last seen in urgent care in March and was given a dexamethasone injection in clinic, Augmentin, and oral prednisone.  He did not take the Augmentin but states he did start it yesterday and has taken 2 doses so far.  He reports that dexamethasone injection is the most beneficial for his pain.  Pt has taken this OTC for symptoms.  He states he will call tomorrow to establish An appointment with an ENT.  Pt has no other concerns at this time.   HPI  Past Medical History:  Diagnosis Date   Allergy    Arthritis    hand, neck   Back pain 09/11/2016   patient denies back pain but has neck pain   Bicuspid aortic valve    DEGENERATIVE DISC DISEASE, CERVICAL SPINE    Heart murmur    never has caused any problems   History of chicken pox    Hypertension    Migraines    last one 2 wks ago- allergy related    MITRAL VALVE PROLAPSE    PSA, INCREASED    TINNITUS, CHRONIC, BILATERAL     Patient Active Problem List   Diagnosis Date Noted   ILD (interstitial lung disease) (HCC) 02/01/2023   Primary osteoarthritis of first carpometacarpal joint of right hand 04/25/2022   Primary osteoarthritis of first carpometacarpal joint of left hand  04/25/2022   Cervical spondylosis with radiculopathy 06/16/2019   Chronic pansinusitis 04/21/2019   Nasal turbinate hypertrophy 03/19/2019   Nasal septal deviation 03/19/2019   Encounter for screening for lung cancer 01/14/2018   Right-sided thoracic back pain 09/11/2016   Situational anxiety 09/04/2016   Warts of foot 10/25/2015   Visit for preventive health examination 10/06/2014   Prostate cancer screening 10/06/2014   Bicuspid aortic valve 09/24/2012   Hypertension 09/24/2012   DEGENERATIVE DISC DISEASE, CERVICAL SPINE 02/18/2010   TOBACCO ABUSE 07/08/2009   TINNITUS, CHRONIC, BILATERAL 07/08/2009   Mitral valve disorder 07/08/2009   Chronic rhinitis 07/08/2009   PSA, INCREASED 07/08/2009    Past Surgical History:  Procedure Laterality Date   CERVICAL DISC SURGERY  12/2019   CERVICAL SPINE SURGERY     COLONOSCOPY  2012   [place] hx polyps   CYST REMOVAL NECK     And Face   NASAL SEPTOPLASTY W/ TURBINOPLASTY Bilateral 09/25/2019   Procedure: NASAL SEPTOPLASTY WITH TURBINATE REDUCTION;  Surgeon: Osborn Coho, MD;  Location: [facility];  Service: ENT;  Laterality: Bilateral;   POLYPECTOMY     Colon   PROSTATE BIOPSY  06/2019   negative   SINUS ENDO WITH FUSION Bilateral 09/25/2019   Procedure: ENDOSCOPIC SINUS SURGERY WITH FUSION NAVIGATION;  Surgeon:  Osborn Coho, MD;  Location: [facility];  Service: ENT;  Laterality: Bilateral;   Tonsillectome     WISDOM TOOTH EXTRACTION         Home Medications    Prior to Admission medications   Medication Sig Start Date End Date Taking? Authorizing Provider  amLODipine (NORVASC) 10 MG tablet Take 1 tablet (10 mg total) by mouth daily. 12/18/22  Yes Wendall Stade, MD  amoxicillin-clavulanate (AUGMENTIN) 875-125 MG tablet Take 1 tablet by mouth 2 (two) times daily. 01/25/23  Yes Wallis Bamberg, PA-C  fexofenadine (ALLEGRA) 180 MG tablet Take 1 tablet (180 mg total) by mouth daily. 01/25/23  Yes  Wallis Bamberg, PA-C  losartan (COZAAR) 100 MG tablet Take 1 tablet (100 mg total) by mouth daily. 12/18/22  Yes Wendall Stade, MD  naproxen sodium (ALEVE) 220 MG tablet Take 220 mg by mouth 2 (two) times daily as needed.   Yes [provider]  predniSONE (DELTASONE) 20 MG tablet Take 2 tablets (40 mg total) by mouth daily with breakfast for 5 days. 03/26/23 03/31/23 Yes Radford Pax, NP  diclofenac Sodium (VOLTAREN) 1 % GEL Apply 2 g topically 4 (four) times daily. 03/02/20   Waldon Merl, PA-C  fluticasone (FLONASE) 50 MCG/ACT nasal spray Place 2 sprays into both nostrils daily. 03/03/22   Wendall Stade, MD  sodium chloride (OCEAN) 0.65 % SOLN nasal spray Place 1 spray into both nostrils as needed for congestion.    [provider]  sulfamethoxazole-trimethoprim (BACTRIM DS) 800-160 MG tablet Take 1 tablet by mouth 2 (two) times daily. 02/14/23   [place], Ramon Dredge, PA-C    Family History Family History  Problem Relation Age of Onset   Colon cancer Mother 52       Deceased   Lung cancer Mother    Liver cancer Mother    Breast cancer Sister    Lymphoma Father 70       Deceased   Diabetes Father    Cancer Other        Paternal Grandparents   Cancer Other        Maternal Grandparents   Emphysema Paternal Grandfather    Emphysema Maternal Grandfather    Cancer Paternal Aunt    Cancer Maternal Aunt    Esophageal cancer Neg Hx    Stomach cancer Neg Hx    Rectal cancer Neg Hx     Social History Social History   Tobacco Use   Smoking status: Every Day    Packs/day: 1.00    Years: 40.00    Additional pack years: 0.00    Total pack years: 40.00    Types: Cigarettes   Smokeless tobacco: Never  Vaping Use   Vaping Use: Never used  Substance Use Topics   Alcohol use: Yes    Alcohol/week: 14.0 standard drinks of alcohol    Types: 7 Glasses of wine, 7 Shots of liquor per week    Comment: 1 glass of scotch daily   Drug use: No     Allergies   Patient has no  known allergies.   Review of Systems Review of Systems  HENT:  Positive for congestion, sinus pressure and sinus pain.   Respiratory:  Positive for cough.      Physical Exam Triage Vital Signs ED Triage Vitals  Enc Vitals Group     BP 03/25/23 1339 (!) 181/72     Pulse Rate 03/25/23 1339 83     Resp 03/25/23 1339  18     Temp 03/25/23 1339 98.4 F (36.9 C)     Temp Source 03/25/23 1339 Oral     SpO2 03/25/23 1339 97 %     Weight 03/25/23 1337 170 lb (77.1 kg)     Height --      Head Circumference --      Peak Flow --      Pain Score 03/25/23 1337 8     Pain Loc --      Pain Edu? --      Excl. in GC? --    No data found.  Updated Vital Signs BP (!) 181/72 (BP Location: Left Arm)   Pulse 83   Temp 98.4 F (36.9 C) (Oral)   Resp 18   Wt 170 lb (77.1 kg)   SpO2 97%   BMI 25.10 kg/m   Visual Acuity Right Eye Distance:   Left Eye Distance:   Bilateral Distance:    Right Eye Near:   Left Eye Near:    Bilateral Near:     Physical Exam Vitals and nursing note reviewed.  Constitutional:      General: He is not in acute distress.    Appearance: Normal appearance. He is not ill-appearing, toxic-appearing or diaphoretic.  HENT:     Head: Normocephalic and atraumatic.     Right Ear: Tympanic membrane, ear canal and external ear normal.     Left Ear: Tympanic membrane, ear canal and external ear normal.     Nose: Congestion present.     Left Turbinates: Swollen.     Right Sinus: No maxillary sinus tenderness or frontal sinus tenderness.     Left Sinus: Maxillary sinus tenderness present. No frontal sinus tenderness.     Mouth/Throat:     Mouth: Mucous membranes are moist.     Pharynx: No oropharyngeal exudate or posterior oropharyngeal erythema.  Eyes:     Pupils: Pupils are equal, round, and reactive to light.  Cardiovascular:     Rate and Rhythm: Normal rate and regular rhythm.     Heart sounds: Murmur heard.  Pulmonary:     Effort: Pulmonary effort is  normal.     Breath sounds: Normal breath sounds.  Skin:    General: Skin is warm and dry.  Neurological:     General: No focal deficit present.     Mental Status: He is alert and oriented to person, place, and time.  Psychiatric:        Mood and Affect: Mood normal.        Behavior: Behavior normal.      UC Treatments / Results  Labs (all labs ordered are listed, but only abnormal results are displayed) Labs Reviewed - No data to display  EKG   Radiology No results found.  Procedures Procedures (including critical care time)  Medications Ordered in UC Medications  dexamethasone (DECADRON) injection 10 mg (10 mg Intramuscular Given 03/25/23 1356)    Initial Impression / Assessment and Plan / UC Course  I have reviewed the triage vital signs and the nursing notes.  Pertinent labs & imaging results that were available during my care of the patient were reviewed by me and considered in my medical decision making (see chart for details).     Reviewed exam and symptoms with patient.  As he is already started the Augmentin advised him to complete course as prescribed He requested dexamethasone injection in clinic stating this is the only thing that helps with his pain.  He was given 10 mg IM dexamethasone.  He was monitored after injection with no reaction noted and tolerated well He will start prednisone orally tomorrow 5/20 Advised to continue nasal rinses as tolerated Strongly encouraged to follow-up with ENT for further evaluation and treatment options of his chronic sinus issues ER precautions reviewed and patient verbalized understanding Final Clinical Impressions(s) / UC Diagnoses   Final diagnoses:  Chronic maxillary sinusitis     Discharge Instructions      Continue the Augmentin as previously prescribed You are given an injection of a steroid in the clinic.  He may start oral prednisone tomorrow, 5/20 continue nasal rinses as tolerated Please follow-up with  ear nose and throat for further workup of your chronic sinus infections Please go to the ER for any worsening symptoms    ED Prescriptions     Medication Sig Dispense Auth. Provider   predniSONE (DELTASONE) 20 MG tablet Take 2 tablets (40 mg total) by mouth daily with breakfast for 5 days. 10 tablet Radford Pax, NP      PDMP not reviewed this encounter.   Radford Pax, NP 03/25/23 1358

## 2023-03-25 NOTE — Discharge Instructions (Addendum)
Continue the Augmentin as previously prescribed You are given an injection of a steroid in the clinic.  He may start oral prednisone tomorrow, 5/20 continue nasal rinses as tolerated Please follow-up with ear nose and throat for further workup of your chronic sinus infections Please go to the ER for any worsening symptoms

## 2023-03-25 NOTE — ED Triage Notes (Signed)
Pt states that he's been fighting a sinus infection for 3 months. He's had multiple steroid treatments and ABX. Still has a rx for a abx that he didn't take. Started taking taking amoxi-clav 875 last night.

## 2023-04-12 ENCOUNTER — Encounter: Payer: Self-pay | Admitting: Medical

## 2023-04-12 MED ORDER — AZELASTINE HCL 0.1 % NA SOLN: 2.0000 | Freq: Two times a day (BID) | NASAL | 0 refills | Status: AC

## 2023-04-12 NOTE — Addendum Note (Signed)
Addended by: Gwenevere Abbot on: 04/12/2023 04:22 PM   Modules accepted: Orders

## 2023-04-18 ENCOUNTER — Ambulatory Visit (INDEPENDENT_AMBULATORY_CARE_PROVIDER_SITE_OTHER): Payer: Medicare Other | Admitting: Medical

## 2023-04-18 VITALS — BP 134/62 | HR 64 | Temp 98.0°F | Resp 18 | Ht 69.0 in | Wt 168.2 lb

## 2023-04-18 DIAGNOSIS — Z136 Encounter for screening for cardiovascular disorders: Secondary | ICD-10-CM | POA: Diagnosis not present

## 2023-04-18 DIAGNOSIS — R972 Elevated prostate specific antigen [PSA]: Secondary | ICD-10-CM

## 2023-04-18 DIAGNOSIS — Z13220 Encounter for screening for lipoid disorders: Secondary | ICD-10-CM

## 2023-04-18 DIAGNOSIS — Z0000 Encounter for general adult medical examination without abnormal findings: Secondary | ICD-10-CM

## 2023-04-18 DIAGNOSIS — J329 Chronic sinusitis, unspecified: Secondary | ICD-10-CM | POA: Diagnosis not present

## 2023-04-18 LAB — CBC WITH DIFFERENTIAL/PLATELET
Basophils Absolute: 0.1 10*3/uL (ref 0.0–0.1)
Basophils Relative: 1.5 % (ref 0.0–3.0)
Eosinophils Absolute: 0.5 10*3/uL (ref 0.0–0.7)
Eosinophils Relative: 6 % — ABNORMAL HIGH (ref 0.0–5.0)
HCT: 46.3 % (ref 39.0–52.0)
Hemoglobin: 15.6 g/dL (ref 13.0–17.0)
Lymphocytes Relative: 28.9 % (ref 12.0–46.0)
Lymphs Abs: 2.2 10*3/uL (ref 0.7–4.0)
MCHC: 33.7 g/dL (ref 30.0–36.0)
MCV: 97.6 fl (ref 78.0–100.0)
Monocytes Absolute: 0.6 10*3/uL (ref 0.1–1.0)
Monocytes Relative: 7.6 % (ref 3.0–12.0)
Neutro Abs: 4.3 10*3/uL (ref 1.4–7.7)
Neutrophils Relative %: 56 % (ref 43.0–77.0)
Platelets: 248 10*3/uL (ref 150.0–400.0)
RBC: 4.74 Mil/uL (ref 4.22–5.81)
RDW: 14.2 % (ref 11.5–15.5)
WBC: 7.8 10*3/uL (ref 4.0–10.5)

## 2023-04-18 LAB — COMPREHENSIVE METABOLIC PANEL
ALT: 17 U/L (ref 0–53)
AST: 20 U/L (ref 0–37)
Albumin: 4.3 g/dL (ref 3.5–5.2)
Alkaline Phosphatase: 54 U/L (ref 39–117)
BUN: 19 mg/dL (ref 6–23)
CO2: 27 mEq/L (ref 19–32)
Calcium: 9 mg/dL (ref 8.4–10.5)
Chloride: 103 mEq/L (ref 96–112)
Creatinine, Ser: 0.97 mg/dL (ref 0.40–1.50)
GFR: 82.23 mL/min (ref >60.00)
Glucose, Bld: 95 mg/dL (ref 70–99)
Potassium: 4.8 mEq/L (ref 3.5–5.1)
Sodium: 138 mEq/L (ref 135–145)
Total Bilirubin: 0.7 mg/dL (ref 0.2–1.2)
Total Protein: 6.8 g/dL (ref 6.0–8.3)

## 2023-04-18 LAB — LIPID PANEL
Cholesterol: 156 mg/dL (ref 0–200)
HDL: 82.4 mg/dL (ref >39.00)
LDL Cholesterol: 65 mg/dL (ref 0–99)
NonHDL: 74
Total CHOL/HDL Ratio: 2
Triglycerides: 44 mg/dL (ref 0.0–149.0)
VLDL: 8.8 mg/dL (ref 0.0–40.0)

## 2023-04-18 MED ORDER — FLUTICASONE PROPIONATE 50 MCG/ACT NA SUSP: 2.0000 | Freq: Every day | NASAL | 1 refills | Status: AC

## 2023-04-18 NOTE — Patient Instructions (Addendum)
For you wellness exam today I have ordered cbc, cmp and lipid panel.  Vaccine up to date. Counseled on pneumonia vaccine. Don't see done in computer.  Recommend exercise and healthy diet.  We will let you know lab results as they come in.  Follow up date appointment will be determined after lab review.     Chronic sinusitis. Some improved with flonase and adding on astelin. Refilled flonase today. ENT appt next week. If signs/symptoms worsen before weekend let me know(could restart antibiotic again if you worsen). If on Friday call the office directly.   Preventive Care 46-50 Years Old, Male Preventive care refers to lifestyle choices and visits with your health care provider that can promote health and wellness. Preventive care visits are also called wellness exams. What can I expect for my preventive care visit? Counseling During your preventive care visit, your health care provider may ask about your: Medical history, including: Past medical problems. Family medical history. Current health, including: Emotional well-being. Home life and relationship well-being. Sexual activity. Lifestyle, including: Alcohol, nicotine or tobacco, and drug use. Access to firearms. Diet, exercise, and sleep habits. Safety issues such as seatbelt and bike helmet use. Sunscreen use. Work and work Astronomer. Physical exam Your health care provider will check your: Height and weight. These may be used to calculate your BMI (body mass index). BMI is a measurement that tells if you are at a healthy weight. Waist circumference. This measures the distance around your waistline. This measurement also tells if you are at a healthy weight and may help predict your risk of certain diseases, such as type 2 diabetes and high blood pressure. Heart rate and blood pressure. Body temperature. Skin for abnormal spots. What immunizations do I need?  Vaccines are usually given at various ages, according to a  schedule. Your health care provider will recommend vaccines for you based on your age, medical history, and lifestyle or other factors, such as travel or where you work. What tests do I need? Screening Your health care provider may recommend screening tests for certain conditions. This may include: Lipid and cholesterol levels. Diabetes screening. This is done by checking your blood sugar (glucose) after you have not eaten for a while (fasting). Hepatitis B test. Hepatitis C test. HIV (human immunodeficiency virus) test. STI (sexually transmitted infection) testing, if you are at risk. Lung cancer screening. Prostate cancer screening. Colorectal cancer screening. Talk with your health care provider about your test results, treatment options, and if necessary, the need for more tests. Follow these instructions at home: Eating and drinking  Eat a diet that includes fresh fruits and vegetables, whole grains, lean protein, and low-fat dairy products. Take vitamin and mineral supplements as recommended by your health care provider. Do not drink alcohol if your health care provider tells you not to drink. If you drink alcohol: Limit how much you have to 0-2 drinks a day. Know how much alcohol is in your drink. In the [place] one drink equals one 12 oz bottle of beer (355 mL), one 5 oz glass of wine (148 mL), or one 1 oz glass of hard liquor (44 mL). Lifestyle Brush your teeth every morning and night with fluoride toothpaste. Floss one time each day. Exercise for at least 30 minutes 5 or more days each week. Do not use any products that contain nicotine or tobacco. These products include cigarettes, chewing tobacco, and vaping devices, such as e-cigarettes. If you need help quitting, ask your health care provider. Do  not use drugs. If you are sexually active, practice safe sex. Use a condom or other form of protection to prevent STIs. Take aspirin only as told by your health care provider. Make  sure that you understand how much to take and what form to take. Work with your health care provider to find out whether it is safe and beneficial for you to take aspirin daily. Find healthy ways to manage stress, such as: Meditation, yoga, or listening to music. Journaling. Talking to a trusted person. Spending time with friends and family. Minimize exposure to UV radiation to reduce your risk of skin cancer. Safety Always wear your seat belt while driving or riding in a vehicle. Do not drive: If you have been drinking alcohol. Do not ride with someone who has been drinking. When you are tired or distracted. While texting. If you have been using any mind-altering substances or drugs. Wear a helmet and other protective equipment during sports activities. If you have firearms in your house, make sure you follow all gun safety procedures. What's next? Go to your health care provider once a year for an annual wellness visit. Ask your health care provider how often you should have your eyes and teeth checked. Stay up to date on all vaccines. This information is not intended to replace advice given to you by your health care provider. Make sure you discuss any questions you have with your health care provider. Document Revised: 04/20/2021 Document Reviewed: 04/20/2021 Elsevier Patient Education  2024 [employer].

## 2023-04-18 NOTE — Progress Notes (Signed)
Subjective:    Patient ID: David Cunningham, male    DOB: Aug 04, 1956, 67 y.o.   MRN: [id number]  HPI  In for wellness exam   Pt is fasting. Pt retired Sales promotion account executive. Pt tries to go to gym at 3 timesa week and also very active. Pt states eating healthy. Smoke pack a day for 40 years. 2 shots of scotch at night. 1 cup of coffee a day.  Pt has been golfing on spare time now that is retired.  Pt still smoking. He has seen lung MD. Pt will get repeat ct chest.   Pt has dilated aortic anuerysm. 4.3 cm. That is being followed by cardiologist.   Elevated psa. Will see urologist. Has been followed for years.    Review of Systems  Constitutional:  Negative for chills, fatigue and fever.  HENT:  Negative for congestion, ear discharge and ear pain.        Pt has some left sinus pressure and has 3 rounds of antibiotic over past months. Still having pain presently. He also note gums left side upper teeth painful. Some pain on chewing on left side.  He has ent appt next week.  Last ED visit got dexamethasone injection and augmentin.  Pt is using flonase and astelin. Presently feels well.     Respiratory:  Negative for chest tightness, shortness of breath and wheezing.   Cardiovascular:  Negative for chest pain and palpitations.  Gastrointestinal:  Negative for abdominal pain and blood in stool.  Genitourinary:  Negative for dysuria, flank pain, frequency and penile pain.  Musculoskeletal:  Negative for myalgias and neck pain.  Skin:  Negative for rash.  Neurological:  Negative for dizziness, seizures, syncope, weakness and light-headedness.  Psychiatric/Behavioral:  Negative for behavioral problems and confusion.     Past Medical History:  Diagnosis Date   Allergy    Arthritis    hand, neck   Back pain 09/11/2016   patient denies back pain but has neck pain   Bicuspid aortic valve    DEGENERATIVE DISC DISEASE, CERVICAL SPINE    Heart murmur    never has caused any  problems   History of chicken pox    Hypertension    Migraines    last one 2 wks ago- allergy related    MITRAL VALVE PROLAPSE    PSA, INCREASED    TINNITUS, CHRONIC, BILATERAL      Social History   Socioeconomic History   Marital status: Married    Spouse name: Not on file   Number of children: Not on file   Years of education: Not on file   Highest education level: Bachelor's degree (e.g., BA, AB, BS)  Occupational History   Occupation: Retired  Tobacco Use   Smoking status: Every Day    Packs/day: 1.00    Years: 40.00    Additional pack years: 0.00    Total pack years: 40.00    Types: Cigarettes   Smokeless tobacco: Never  Vaping Use   Vaping Use: Never used  Substance and Sexual Activity   Alcohol use: Yes    Alcohol/week: 14.0 standard drinks of alcohol    Types: 7 Glasses of wine, 7 Shots of liquor per week    Comment: 1 glass of scotch daily   Drug use: No   Sexual activity: Yes    Partners: Female    Comment: wife  Other Topics Concern   Not on file  Social History Narrative   Not  on file   Social Determinants of Health   Financial Resource Strain: Low Risk  (02/13/2023)   Overall Financial Resource Strain (CARDIA)    Difficulty of Paying Living Expenses: Not hard at all  Food Insecurity: No Food Insecurity (02/13/2023)   Hunger Vital Sign    Worried About Running Out of Food in the Last Year: Never true    Ran Out of Food in the Last Year: Never true  Transportation Needs: No Transportation Needs (02/13/2023)   PRAPARE - Administrator, Civil Service (Medical): No    Lack of Transportation (Non-Medical): No  Physical Activity: Sufficiently Active (02/13/2023)   Exercise Vital Sign    Days of Exercise per Week: 5 days    Minutes of Exercise per Session: 120 min  Stress: No Stress Concern Present (02/13/2023)   [employer] of Occupational Health - Occupational Stress Questionnaire    Feeling of Stress : Not at all  Social Connections:  Moderately Isolated (02/13/2023)   Social Connection and Isolation Panel [NHANES]    Frequency of Communication with Friends and Family: More than three times a week    Frequency of Social Gatherings with Friends and Family: Three times a week    Attends Religious Services: Never    Active Member of Clubs or Organizations: No    Attends Engineer, structural: Not on file    Marital Status: Married  Intimate Partner Violence: Not on file    Past Surgical History:  Procedure Laterality Date   CERVICAL DISC SURGERY  12/2019   CERVICAL SPINE SURGERY     COLONOSCOPY  2012   [place] hx polyps   CYST REMOVAL NECK     And Face   NASAL SEPTOPLASTY W/ TURBINOPLASTY Bilateral 09/25/2019   Procedure: NASAL SEPTOPLASTY WITH TURBINATE REDUCTION;  Surgeon: Osborn Coho, MD;  Location: [facility];  Service: ENT;  Laterality: Bilateral;   POLYPECTOMY     Colon   PROSTATE BIOPSY  06/2019   negative   SINUS ENDO WITH FUSION Bilateral 09/25/2019   Procedure: ENDOSCOPIC SINUS SURGERY WITH FUSION NAVIGATION;  Surgeon: Osborn Coho, MD;  Location: [facility];  Service: ENT;  Laterality: Bilateral;   Tonsillectome     WISDOM TOOTH EXTRACTION      Family History  Problem Relation Age of Onset   Colon cancer Mother 46       Deceased   Lung cancer Mother    Liver cancer Mother    Breast cancer Sister    Lymphoma Father 48       Deceased   Diabetes Father    Cancer Other        Paternal Grandparents   Cancer Other        Maternal Grandparents   Emphysema Paternal Grandfather    Emphysema Maternal Grandfather    Cancer Paternal Aunt    Cancer Maternal Aunt    Esophageal cancer Neg Hx    Stomach cancer Neg Hx    Rectal cancer Neg Hx     No Known Allergies  Current Outpatient Medications on File Prior to Visit  Medication Sig Dispense Refill   amLODipine (NORVASC) 10 MG tablet Take 1 tablet (10 mg total) by mouth daily. 90 tablet 3    azelastine (ASTELIN) 0.1 % nasal spray Place 2 sprays into both nostrils 2 (two) times daily. Use in each nostril as directed 30 mL 0   diclofenac Sodium (VOLTAREN) 1 % GEL Apply 2 g topically  4 (four) times daily. 100 g 1   fexofenadine (ALLEGRA) 180 MG tablet Take 1 tablet (180 mg total) by mouth daily. 90 tablet 0   fluticasone (FLONASE) 50 MCG/ACT nasal spray Place 2 sprays into both nostrils daily. 16 g 2   losartan (COZAAR) 100 MG tablet Take 1 tablet (100 mg total) by mouth daily. 90 tablet 3   naproxen sodium (ALEVE) 220 MG tablet Take 220 mg by mouth 2 (two) times daily as needed.     sodium chloride (OCEAN) 0.65 % SOLN nasal spray Place 1 spray into both nostrils as needed for congestion.     No current facility-administered medications on file prior to visit.    BP 134/62   Pulse 64   Temp 98 F (36.7 C)   Resp 18   Ht 5\' 9"  (1.753 m)   Wt 168 lb 3.2 oz (76.3 kg)   SpO2 100%   BMI 24.84 kg/m        Objective:   Physical Exam   General Mental Status- Alert. General Appearance- Not in acute distress.   Skin General: Color- Normal Color. Moisture- Normal Moisture.  Neck Carotid Arteries- Normal color. Moisture- Normal Moisture. No carotid bruits. No JVD.  Chest and Lung Exam Auscultation: Breath Sounds:-Normal.  Cardiovascular Auscultation:Rythm- Regular. Murmurs & Other Heart Sounds:Auscultation of the heart reveals- No Murmurs.  Abdomen Inspection:-Inspeection Normal. Palpation/Percussion:Note:No mass. Palpation and Percussion of the abdomen reveal- Non Tender, Non Distended + BS, no rebound or guarding.   Neurologic Cranial Nerve exam:- CN III-XII intact(No nystagmus), symmetric smile. Strength:- 5/5 equal and symmetric strength both upper and lower extremities.   Heent- faint left maxillary sinus pressure today. Mouth- left upper teeth. Gums look mild inflamed. No obvious caries.     Assessment & Plan:   Patient Instructions  For you wellness  exam today I have ordered cbc, cmp and lipid panel.  Vaccine up to date. Counseled on pneumonia vaccine. Don't see done in computer.  Recommend exercise and healthy diet.  We will let you know lab results as they come in.  Follow up date appointment will be determined after lab review.         Chronic sinusitis. Some improved with flonase and adding on astelin. Refilled flonase today. ENT appt next week. If signs/symptoms worsen before weekend let me know(could restart antibiotic again if you worsen). If on Friday call the office directly.   Esperanza Richters, [place]    [id number] charge as addressed and discussed chronic sinusitis issues. Rx flonase.

## 2023-04-19 ENCOUNTER — Ambulatory Visit (INDEPENDENT_AMBULATORY_CARE_PROVIDER_SITE_OTHER): Payer: Medicare Other

## 2023-04-19 ENCOUNTER — Encounter: Payer: Self-pay | Admitting: Medical

## 2023-04-19 DIAGNOSIS — R972 Elevated prostate specific antigen [PSA]: Secondary | ICD-10-CM | POA: Diagnosis not present

## 2023-04-19 LAB — PSA: PSA: 6.5 ng/mL — ABNORMAL HIGH (ref 0.10–4.00)

## 2023-04-19 NOTE — Addendum Note (Signed)
Addended by: Rosita Kea on: 04/19/2023 08:23 AM   Modules accepted: Orders

## 2023-04-19 NOTE — Addendum Note (Signed)
Addended by: Gwenevere Abbot on: 04/19/2023 08:15 AM   Modules accepted: Orders

## 2023-04-23 DIAGNOSIS — Z9889 Other specified postprocedural states: Secondary | ICD-10-CM | POA: Insufficient documentation

## 2023-04-23 DIAGNOSIS — J3489 Other specified disorders of nose and nasal sinuses: Secondary | ICD-10-CM | POA: Insufficient documentation

## 2023-04-23 DIAGNOSIS — J0101 Acute recurrent maxillary sinusitis: Secondary | ICD-10-CM | POA: Diagnosis not present

## 2023-05-07 ENCOUNTER — Encounter: Payer: Self-pay | Admitting: Medical

## 2023-05-14 DIAGNOSIS — R972 Elevated prostate specific antigen [PSA]: Secondary | ICD-10-CM | POA: Diagnosis not present

## 2023-05-14 DIAGNOSIS — N401 Enlarged prostate with lower urinary tract symptoms: Secondary | ICD-10-CM | POA: Diagnosis not present

## 2023-05-14 DIAGNOSIS — R3912 Poor urinary stream: Secondary | ICD-10-CM | POA: Diagnosis not present

## 2023-05-15 ENCOUNTER — Ambulatory Visit (HOSPITAL_COMMUNITY): Payer: Medicare Other | Attending: Cardiology

## 2023-05-15 DIAGNOSIS — Q231 Congenital insufficiency of aortic valve: Secondary | ICD-10-CM | POA: Diagnosis not present

## 2023-05-15 DIAGNOSIS — I350 Nonrheumatic aortic (valve) stenosis: Secondary | ICD-10-CM

## 2023-05-15 DIAGNOSIS — I351 Nonrheumatic aortic (valve) insufficiency: Secondary | ICD-10-CM

## 2023-05-15 DIAGNOSIS — Z87891 Personal history of nicotine dependence: Secondary | ICD-10-CM

## 2023-05-15 DIAGNOSIS — Q2381 Bicuspid aortic valve: Secondary | ICD-10-CM

## 2023-05-15 LAB — ECHOCARDIOGRAM COMPLETE
AR max vel: 1.93 cm2
AV Area VTI: 1.87 cm2
AV Area mean vel: 1.8 cm2
AV Mean grad: 23.6 mmHg
AV Peak grad: 41 mmHg
Ao pk vel: 3.2 m/s
Area-P 1/2: 3.66 cm2
MV M vel: 4.91 m/s
MV Peak grad: 96.4 mmHg
P 1/2 time: 303 msec
S' Lateral: 3 cm

## 2023-05-19 ENCOUNTER — Other Ambulatory Visit: Payer: Self-pay | Admitting: Medical

## 2023-05-23 DIAGNOSIS — K080 Exfoliation of teeth due to systemic causes: Secondary | ICD-10-CM | POA: Diagnosis not present

## 2023-06-13 DIAGNOSIS — K080 Exfoliation of teeth due to systemic causes: Secondary | ICD-10-CM | POA: Diagnosis not present

## 2023-06-13 NOTE — Progress Notes (Signed)
Date:  06/18/2023   ID:  David Cunningham, DOB [birth year], MRN [id number]   Provider Location: Office  PCP:  Marisue Brooklyn  Cardiologist:  Eden Emms Electrophysiologist:  None   Evaluation Performed:  Follow-Up Visit In person   Chief Complaint:  AV Disease  History of Present Illness:     67 y.o. f/u for bicuspid AV disease. No family history of cardiac disease. No history of CAD or chest pain.  Poor diet with excess salt, smokes and has scotch most nights. Normal ETT 10/09/12 with HTN response.  TTE: 05/12/22 EF 60-65% grade 2 diastolic mild/moderate MR ? Myxomatous bicuspid AV with severe AR moderate AS mean gradient 23 mmHg peak 40 mmHg DVI 0.34 AR T1/2 114 msec Progression of AR since 2022 His pressure half time < 250 msec and holodiastolic reversal in ascending aorta LV dimensions 39 mm ES and 56 mm ED    Cardiac CTA 05/25/20 calcium score 22 isolated to LAD only 27 th percentile for age/sex. Left dominant with no obstructive disease CAD RADS 1 AV calcium score 534 bicuspid valve fused right and left cusps Ascending aortic root 4.2 cm    CT head HP 02/2015 no aneurysm normal carotid system   Dentition in good shape  Youngest son graduated from [employer] And is home now  Step daughter in [place] and oldest son in [place]   He feels great retired 2022 from [employer] business   I showed him his latest TTE images. Discussed natural history of severe AR and indications for surgery including symptoms, high ED/ES dimensions , low EF Aortic aneurysm > 5 cm in setting of bicuspid valve Currently he feels great. Using treadmill 30 minutes 3x/week and lifting weights Moving yard and active. No CHF/Dyspnea, chest pain palpitations or syncope. BP elevated discussed increasing norvasc to 10 mg.   Also discussed smoking cessation CT chest 12/27/22 new pleural based 5 mm LUL nodule with ILD   Cardiopulmonary stress test: 04/26/22  showed low normal VO2 24.5 ml/kg/min 82% predicted no cardiac  limitations some pulmonary restrictive limitations   Retired will be on medicare in June Active at gym no cardiac symptoms  TTE 05/15/23 severe AR mean gradient 23.6 peak 41 mmHg Aortic root 4.2 and 4.3 on lung CT above EDD 55 ESD 30 mm   Discussed cardiac MRI to assess ESV/EDV better than single dimension assessment of LV compensation and doing regurgitant fraction and quantitative EF   Seen in ED 03/25/23 chronic maxillary sinusitis Rx Augmentin and steroids   Needs tooth pulled and a lot of his sinus issues appear to be from this Defers having MRI Had hard time with it in 2015  Active with no symptoms   Past Medical History:  Diagnosis Date   Allergy    Arthritis    hand, neck   Back pain 09/11/2016   patient denies back pain but has neck pain   Bicuspid aortic valve    DEGENERATIVE DISC DISEASE, CERVICAL SPINE    Heart murmur    never has caused any problems   History of chicken pox    Hypertension    Migraines    last one 2 wks ago- allergy related    MITRAL VALVE PROLAPSE    PSA, INCREASED    TINNITUS, CHRONIC, BILATERAL    Past Surgical History:  Procedure Laterality Date   CERVICAL DISC SURGERY  12/2019   CERVICAL SPINE SURGERY     COLONOSCOPY  2012   Jarold Motto hx  polyps   CYST REMOVAL NECK     And Face   NASAL SEPTOPLASTY W/ TURBINOPLASTY Bilateral 09/25/2019   Procedure: NASAL SEPTOPLASTY WITH TURBINATE REDUCTION;  Surgeon: Osborn Coho, MD;  Location: [facility];  Service: ENT;  Laterality: Bilateral;   POLYPECTOMY     Colon   PROSTATE BIOPSY  06/2019   negative   SINUS ENDO WITH FUSION Bilateral 09/25/2019   Procedure: ENDOSCOPIC SINUS SURGERY WITH FUSION NAVIGATION;  Surgeon: Osborn Coho, MD;  Location: [facility];  Service: ENT;  Laterality: Bilateral;   Tonsillectome     WISDOM TOOTH EXTRACTION       Current Meds  Medication Sig   amLODipine (NORVASC) 10 MG tablet Take 1 tablet (10 mg total) by mouth daily.    azelastine (ASTELIN) 0.1 % nasal spray Place 2 sprays into both nostrils 2 (two) times daily. Use in each nostril as directed   diclofenac Sodium (VOLTAREN) 1 % GEL Apply 2 g topically 4 (four) times daily.   fexofenadine (ALLEGRA) 180 MG tablet Take 1 tablet (180 mg total) by mouth daily.   fluticasone (FLONASE) 50 MCG/ACT nasal spray Place 2 sprays into both nostrils daily.   losartan (COZAAR) 100 MG tablet TAKE 1 TABLET BY MOUTH DAILY   naproxen sodium (ALEVE) 220 MG tablet Take 220 mg by mouth 2 (two) times daily as needed.   sodium chloride (OCEAN) 0.65 % SOLN nasal spray Place 1 spray into both nostrils as needed for congestion.     Allergies:   Patient has no known allergies.   Social History   Tobacco Use   Smoking status: Every Day    Current packs/day: 1.00    Average packs/day: 1 pack/day for 40.0 years (40.0 ttl pk-yrs)    Types: Cigarettes   Smokeless tobacco: Never  Vaping Use   Vaping status: Never Used  Substance Use Topics   Alcohol use: Yes    Alcohol/week: 14.0 standard drinks of alcohol    Types: 7 Glasses of wine, 7 Shots of liquor per week    Comment: 1 glass of scotch daily   Drug use: No     Family Hx: The patient's family history includes Breast cancer in his sister; Cancer in his maternal aunt, paternal aunt, and other family members; Colon cancer (age of onset: 37) in his mother; Diabetes in his father; Emphysema in his maternal grandfather and paternal grandfather; Liver cancer in his mother; Lung cancer in his mother; Lymphoma (age of onset: 71) in his father. There is no history of Esophageal cancer, Stomach cancer, or Rectal cancer.  ROS:   Please see the history of present illness.     All other systems reviewed and are negative.   Prior CV studies:   The following studies were reviewed today:  Echo 05/12/22 compared to TTE  05/15/23  IMPRESSIONS     1. Bicuspid aortic valve with fusion of left and right coronary cusps;  moderate AS with mean  gradient 24 mmHg; severe AI.   2. Left ventricular ejection fraction, by estimation, is 60 to 65%. The  left ventricle has normal function. The left ventricle has no regional  wall motion abnormalities. The left ventricular internal cavity size was  mildly dilated. Left ventricular  diastolic parameters were normal. The average left ventricular global  longitudinal strain is -22.9 %. The global longitudinal strain is normal.   3. Right ventricular systolic function is normal. The right ventricular  size is normal.   4.  Left atrial size was moderately dilated.   5. The mitral valve is myxomatous. Mild mitral valve regurgitation. No  evidence of mitral stenosis.   6. The aortic valve is bicuspid. Aortic valve regurgitation is moderate.  Severe aortic valve stenosis.   7. Aortic dilatation noted. There is mild dilatation of the ascending  aorta, measuring 42 mm.   8. The inferior vena cava is normal in size with greater than 50%  respiratory variability, suggesting right atrial pressure of 3 mmHg.    Labs/Other Tests and Data Reviewed:    EKG:   06/18/2023 NSR rate 58 normal   Recent Labs: 04/18/2023: ALT 17; BUN 19; Creatinine, Ser 0.97; Hemoglobin 15.6; Platelets 248.0; Potassium 4.8; Sodium 138   Recent Lipid Panel Lab Results  Component Value Date/Time   CHOL 156 04/18/2023 09:11 AM   TRIG 44.0 04/18/2023 09:11 AM   HDL 82.40 04/18/2023 09:11 AM   CHOLHDL 2 04/18/2023 09:11 AM   LDLCALC 65 04/18/2023 09:11 AM    Wt Readings from Last 3 Encounters:  06/18/23 166 lb (75.3 kg)  04/18/23 168 lb 3.2 oz (76.3 kg)  03/25/23 170 lb (77.1 kg)     Objective:    Vital Signs:  BP (!) 150/62   Pulse (!) 59   Ht 5\' 9"  (1.753 m)   Wt 166 lb (75.3 kg)   BMI 24.51 kg/m    Affect appropriate Healthy:  appears stated age HEENT: normal Neck supple with no adenopathy JVP normal no bruits no thyromegaly Lungs clear with no wheezing and good diaphragmatic motion Heart:  S1/S2 AS/AR  murmur, no rub, gallop or click PMI normal Abdomen: benighn, BS positve, no tenderness, no AAA no bruit.  No HSM or HJR Distal pulses intact with no bruits No edema Neuro non-focal Skin warm and dry No muscular weakness   ASSESSMENT & PLAN:    Bicuspid AV:  Severe AR moderate AS LV compensated Signs of elevated EDP He is active, asymptomatic with low ED/ES dimensions Low normal functional capacity by cardiopulmonary stress test 06/26/22  Cardiac MRI to further assess EDV/ESV, quantitative EF, regurgitant fraction and valve morphology Patient defers this testing  Still remembers one done in 2015 and does not want to do another Continue ARB/Norvasc for afterload/BP control  Allergies:  Continue zyrtec consider flonase  F/u primary  ETOH:  Encouraged more moderation in intake labs/LFTls with primary Smoking:  Counseled on smoking cessation for less than 10 minutes Has had script for Chantix CT 12/27/22 with LUL nodule 5 mm and ILD refer to pulmonary  HTN:  Continue high dose Cozaar Increase norvasc to 10 mg  ENT:  Post septoplasty had some sinusitis despite surgery f/u ENT Annalee Genta Has infected tooth and will need more antibiotics and tooth pulled  Aorta:  4.3 cm on lung CT   12/27/22 Sinusitis:  f/u ENT prior 3 rounds of antibiotics       Medication Adjustments/Labs and Tests Ordered: Current medicines are reviewed at length with the patient today.  Concerns regarding medicines are outlined above.   Tests Ordered:  None See dentist   Medication Changes: No orders of the defined types were placed in this encounter.    Disposition:  Follow up in a year  Signed, Charlton Haws, MD  06/18/2023 8:03 AM    [facility]

## 2023-06-15 ENCOUNTER — Ambulatory Visit: Payer: 59 | Admitting: Cardiovascular Disease

## 2023-06-18 ENCOUNTER — Encounter: Payer: Self-pay | Admitting: Cardiovascular Disease

## 2023-06-18 ENCOUNTER — Ambulatory Visit: Payer: Medicare Other | Attending: Cardiovascular Disease | Admitting: Cardiovascular Disease

## 2023-06-18 VITALS — BP 150/62 | HR 59 | Ht 69.0 in | Wt 166.0 lb

## 2023-06-18 DIAGNOSIS — I351 Nonrheumatic aortic (valve) insufficiency: Secondary | ICD-10-CM | POA: Diagnosis not present

## 2023-06-18 DIAGNOSIS — I10 Essential (primary) hypertension: Secondary | ICD-10-CM

## 2023-06-18 NOTE — Patient Instructions (Addendum)
Medication Instructions:  Your physician recommends that you continue on your current medications as directed. Please refer to the Current Medication list given to you today.  *If you need a refill on your cardiac medications before your next appointment, please call your pharmacy*  Lab Work: If you have labs (blood work) drawn today and your tests are completely normal, you will receive your results only by: [place] (if you have MyChart) OR A paper copy in the mail If you have any lab test that is abnormal or we need to change your treatment, we will call you to review the results.  Testing/Procedures: None ordered today.  Follow-Up: At [facility], you and your health needs are our priority.  As part of our continuing mission to provide you with exceptional heart care, we have created designated Provider Care Teams.  These Care Teams include your primary Cardiologist (physician) and Advanced Practice Providers (APPs -  Physician Assistants and Nurse Practitioners) who all work together to provide you with the care you need, when you need it.  We recommend signing up for the patient portal called "MyChart".  Sign up information is provided on this After Visit Summary.  MyChart is used to connect with patients for Virtual Visits (Telemedicine).  Patients are able to view lab/test results, encounter notes, upcoming appointments, etc.  Non-urgent messages can be sent to your provider as well.   To learn more about what you can do with MyChart, go to [email or web address].    Your next appointment:   12 month(s)  Provider:   Jenkins Rouge, MD

## 2023-07-05 DIAGNOSIS — K080 Exfoliation of teeth due to systemic causes: Secondary | ICD-10-CM | POA: Diagnosis not present

## 2023-08-16 DIAGNOSIS — R3912 Poor urinary stream: Secondary | ICD-10-CM | POA: Diagnosis not present

## 2023-08-16 DIAGNOSIS — N401 Enlarged prostate with lower urinary tract symptoms: Secondary | ICD-10-CM | POA: Diagnosis not present

## 2023-08-16 DIAGNOSIS — R972 Elevated prostate specific antigen [PSA]: Secondary | ICD-10-CM | POA: Diagnosis not present

## 2023-09-06 ENCOUNTER — Ambulatory Visit (INDEPENDENT_AMBULATORY_CARE_PROVIDER_SITE_OTHER): Payer: Medicare Other | Admitting: Medical

## 2023-09-06 ENCOUNTER — Encounter: Payer: Self-pay | Admitting: Medical

## 2023-09-06 VITALS — BP 130/70 | HR 62 | Temp 98.0°F | Resp 18 | Ht 69.0 in | Wt 173.0 lb

## 2023-09-06 DIAGNOSIS — J309 Allergic rhinitis, unspecified: Secondary | ICD-10-CM

## 2023-09-06 DIAGNOSIS — H1030 Unspecified acute conjunctivitis, unspecified eye: Secondary | ICD-10-CM

## 2023-09-06 DIAGNOSIS — I10 Essential (primary) hypertension: Secondary | ICD-10-CM

## 2023-09-06 MED ORDER — MONTELUKAST SODIUM 10 MG PO TABS: 10.0000 mg | ORAL_TABLET | Freq: Every day | ORAL | 3 refills | Status: AC

## 2023-09-06 MED ORDER — TOBRAMYCIN 0.3 % OP SOLN: 2.0000 [drp] | OPHTHALMIC | 0 refills | Status: DC

## 2023-09-06 NOTE — Patient Instructions (Addendum)
1. Acute conjunctivitis(Rt eye suspicious/considering bacterial. Not stickiness unilateral dc) -tobrex eye drops. -if any rash around eye let me know.   2. Allergic rhinitis, unspecified seasonality, unspecified trigger  -continue zyrtec, restart flonase and can add on montelukast if symptom not improved by Sunday or Monday.  Advised if worse viral like symptoms test for covid. If sinus pain and productive cough despite the above consider antibiotic.  Htn- bp controlled. Continue current regimen.  Follow up 7 days or sooner if needed

## 2023-09-06 NOTE — Progress Notes (Signed)
Subjective:    Patient ID: David Cunningham, male    DOB: [birth year], 67 y.o.   MRN: [id number]  HPI In today for some watery eye initially since Tuesday. Over last 2 days he mentions possible color to discharge. Lids stuck together on rt side. Mild nasal congestion. No fever, no chills or sweats. On rash on face. No bodyaches. Mild cough.  Pt is on zyrtec and taking flonase occasionally.   Htn- pt bp borderline. He is on amlodipine and losartan   Review of Systems  Constitutional:  Negative for chills, fatigue and fever.  HENT:  Positive for congestion.   Eyes:  Positive for discharge and redness.       See hpi.  Respiratory:  Positive for cough. Negative for chest tightness and wheezing.   Cardiovascular:  Negative for chest pain and palpitations.  Gastrointestinal:  Negative for abdominal pain.  Genitourinary:  Negative for dysuria and frequency.  Musculoskeletal:  Negative for back pain.  Skin:  Negative for rash.  Hematological:  Negative for adenopathy. Does not bruise/bleed easily.  Psychiatric/Behavioral:  Negative for confusion and dysphoric mood.    Past Medical History:  Diagnosis Date   Allergy    Arthritis    hand, neck   Back pain 09/11/2016   patient denies back pain but has neck pain   Bicuspid aortic valve    DEGENERATIVE DISC DISEASE, CERVICAL SPINE    Heart murmur    never has caused any problems   History of chicken pox    Hypertension    Migraines    last one 2 wks ago- allergy related    MITRAL VALVE PROLAPSE    PSA, INCREASED    TINNITUS, CHRONIC, BILATERAL      Social History   Socioeconomic History   Marital status: Married    Spouse name: Not on file   Number of children: Not on file   Years of education: Not on file   Highest education level: Bachelor's degree (e.g., BA, AB, BS)  Occupational History   Occupation: Retired  Tobacco Use   Smoking status: Every Day    Current packs/day: 1.00    Average packs/day: 1 pack/day for 40.0  years (40.0 ttl pk-yrs)    Types: Cigarettes   Smokeless tobacco: Never  Vaping Use   Vaping status: Never Used  Substance and Sexual Activity   Alcohol use: Yes    Alcohol/week: 14.0 standard drinks of alcohol    Types: 7 Glasses of wine, 7 Shots of liquor per week    Comment: 1 glass of scotch daily   Drug use: No   Sexual activity: Yes    Partners: Female    Comment: wife  Other Topics Concern   Not on file  Social History Narrative   Not on file   Social Determinants of Health   Financial Resource Strain: Low Risk  (09/05/2023)   Overall Financial Resource Strain (CARDIA)    Difficulty of Paying Living Expenses: Not hard at all  Food Insecurity: No Food Insecurity (09/05/2023)   Hunger Vital Sign    Worried About Running Out of Food in the Last Year: Never true    Ran Out of Food in the Last Year: Never true  Transportation Needs: No Transportation Needs (09/05/2023)   PRAPARE - Administrator, Civil Service (Medical): No    Lack of Transportation (Non-Medical): No  Physical Activity: Sufficiently Active (09/05/2023)   Exercise Vital Sign    Days of Exercise  per Week: 6 days    Minutes of Exercise per Session: 70 min  Stress: No Stress Concern Present (09/05/2023)   [employer] of Occupational Health - Occupational Stress Questionnaire    Feeling of Stress : Not at all  Social Connections: Moderately Isolated (09/05/2023)   Social Connection and Isolation Panel [NHANES]    Frequency of Communication with Friends and Family: More than three times a week    Frequency of Social Gatherings with Friends and Family: Three times a week    Attends Religious Services: Never    Active Member of Clubs or Organizations: No    Attends Engineer, structural: Not on file    Marital Status: Married  Intimate Partner Violence: Not on file    Past Surgical History:  Procedure Laterality Date   CERVICAL DISC SURGERY  12/2019   CERVICAL SPINE SURGERY      COLONOSCOPY  2012   [place] hx polyps   CYST REMOVAL NECK     And Face   NASAL SEPTOPLASTY W/ TURBINOPLASTY Bilateral 09/25/2019   Procedure: NASAL SEPTOPLASTY WITH TURBINATE REDUCTION;  Surgeon: Osborn Coho, MD;  Location: [facility];  Service: ENT;  Laterality: Bilateral;   POLYPECTOMY     Colon   PROSTATE BIOPSY  06/2019   negative   SINUS ENDO WITH FUSION Bilateral 09/25/2019   Procedure: ENDOSCOPIC SINUS SURGERY WITH FUSION NAVIGATION;  Surgeon: Osborn Coho, MD;  Location: [facility];  Service: ENT;  Laterality: Bilateral;   Tonsillectome     WISDOM TOOTH EXTRACTION      Family History  Problem Relation Age of Onset   Colon cancer Mother 11       Deceased   Lung cancer Mother    Liver cancer Mother    Breast cancer Sister    Lymphoma Father 34       Deceased   Diabetes Father    Cancer Other        Paternal Grandparents   Cancer Other        Maternal Grandparents   Emphysema Paternal Grandfather    Emphysema Maternal Grandfather    Cancer Paternal Aunt    Cancer Maternal Aunt    Esophageal cancer Neg Hx    Stomach cancer Neg Hx    Rectal cancer Neg Hx     No Known Allergies  Current Outpatient Medications on File Prior to Visit  Medication Sig Dispense Refill   amLODipine (NORVASC) 10 MG tablet Take 1 tablet (10 mg total) by mouth daily. 90 tablet 3   azelastine (ASTELIN) 0.1 % nasal spray Place 2 sprays into both nostrils 2 (two) times daily. Use in each nostril as directed 30 mL 0   diclofenac Sodium (VOLTAREN) 1 % GEL Apply 2 g topically 4 (four) times daily. 100 g 1   fexofenadine (ALLEGRA) 180 MG tablet Take 1 tablet (180 mg total) by mouth daily. 90 tablet 0   fluticasone (FLONASE) 50 MCG/ACT nasal spray Place 2 sprays into both nostrils daily. 16 g 1   losartan (COZAAR) 100 MG tablet TAKE 1 TABLET BY MOUTH DAILY 90 tablet 3   naproxen sodium (ALEVE) 220 MG tablet Take 220 mg by mouth 2 (two) times daily as needed.      sodium chloride (OCEAN) 0.65 % SOLN nasal spray Place 1 spray into both nostrils as needed for congestion.     No current facility-administered medications on file prior to visit.    BP 130/70  Pulse 62   Temp 98 F (36.7 C)   Resp 18   Ht 5\' 9"  (1.753 m)   Wt 173 lb (78.5 kg)   SpO2 99%   BMI 25.55 kg/m            Objective:   Physical Exam  General- No acute distress. Pleasant patient. Neck- Full range of motion, no jvd Lungs- Clear, even and unlabored. Heart- regular rate and rhythm. Neurologic- CNII- XII grossly intact.   Heent- no sinus pain. Boggy turbinates. Canals clear and normal tm. Rt eye conjunctiva infected. No dc presently. Left eye contjuntica normal. No dc. Perrl eom intace.     Assessment & Plan:   Patient Instructions  1. Acute conjunctivitis(Rt eye suspicious/considering bacterial. Not stickiness unilateral dc)   2. Allergic rhinitis, unspecified seasonality, unspecified trigger  -continue zyrtec, restart flonase and can add on montelukast if symptom not improved by Sunday or Monday.  Advised if worse viral like symptoms test for covid. If sinus pain and productive cough despite the above consider antibiotic.  Htn- bp controlled. Continue current regimen.  Follow up 7 days or sooner if needed   [employer], PA-C

## 2023-11-19 DIAGNOSIS — R972 Elevated prostate specific antigen [PSA]: Secondary | ICD-10-CM | POA: Diagnosis not present

## 2023-11-20 DIAGNOSIS — K080 Exfoliation of teeth due to systemic causes: Secondary | ICD-10-CM | POA: Diagnosis not present

## 2023-11-26 DIAGNOSIS — R972 Elevated prostate specific antigen [PSA]: Secondary | ICD-10-CM | POA: Diagnosis not present

## 2023-11-26 DIAGNOSIS — N401 Enlarged prostate with lower urinary tract symptoms: Secondary | ICD-10-CM | POA: Diagnosis not present

## 2023-11-26 DIAGNOSIS — R3912 Poor urinary stream: Secondary | ICD-10-CM | POA: Diagnosis not present

## 2023-11-26 DIAGNOSIS — R8271 Bacteriuria: Secondary | ICD-10-CM | POA: Diagnosis not present

## 2023-11-28 DIAGNOSIS — K080 Exfoliation of teeth due to systemic causes: Secondary | ICD-10-CM | POA: Diagnosis not present

## 2023-12-17 DIAGNOSIS — K080 Exfoliation of teeth due to systemic causes: Secondary | ICD-10-CM | POA: Diagnosis not present

## 2023-12-27 ENCOUNTER — Other Ambulatory Visit: Payer: Self-pay | Admitting: Cardiovascular Disease

## 2024-02-13 ENCOUNTER — Encounter: Payer: Self-pay | Admitting: Cardiovascular Disease

## 2024-02-13 DIAGNOSIS — I719 Aortic aneurysm of unspecified site, without rupture: Secondary | ICD-10-CM

## 2024-02-13 DIAGNOSIS — I350 Nonrheumatic aortic (valve) stenosis: Secondary | ICD-10-CM

## 2024-02-13 DIAGNOSIS — R911 Solitary pulmonary nodule: Secondary | ICD-10-CM

## 2024-02-18 DIAGNOSIS — R972 Elevated prostate specific antigen [PSA]: Secondary | ICD-10-CM | POA: Diagnosis not present

## 2024-02-25 ENCOUNTER — Other Ambulatory Visit: Payer: Self-pay | Admitting: Cardiovascular Disease

## 2024-02-25 DIAGNOSIS — R972 Elevated prostate specific antigen [PSA]: Secondary | ICD-10-CM | POA: Diagnosis not present

## 2024-02-27 ENCOUNTER — Ambulatory Visit (HOSPITAL_BASED_OUTPATIENT_CLINIC_OR_DEPARTMENT_OTHER)
Admission: RE | Admit: 2024-02-27 | Discharge: 2024-02-27 | Disposition: A | Discharge status: Home / Self Care | Source: Ambulatory Visit | Attending: Cardiovascular Disease | Admitting: Cardiovascular Disease

## 2024-02-27 DIAGNOSIS — I700 Atherosclerosis of aorta: Secondary | ICD-10-CM | POA: Insufficient documentation

## 2024-02-27 DIAGNOSIS — Z122 Encounter for screening for malignant neoplasm of respiratory organs: Secondary | ICD-10-CM | POA: Insufficient documentation

## 2024-02-27 DIAGNOSIS — J439 Emphysema, unspecified: Secondary | ICD-10-CM | POA: Insufficient documentation

## 2024-02-27 DIAGNOSIS — I719 Aortic aneurysm of unspecified site, without rupture: Secondary | ICD-10-CM | POA: Insufficient documentation

## 2024-02-27 DIAGNOSIS — I2510 Atherosclerotic heart disease of native coronary artery without angina pectoris: Secondary | ICD-10-CM | POA: Insufficient documentation

## 2024-02-27 DIAGNOSIS — R911 Solitary pulmonary nodule: Secondary | ICD-10-CM | POA: Diagnosis not present

## 2024-02-27 DIAGNOSIS — F17210 Nicotine dependence, cigarettes, uncomplicated: Secondary | ICD-10-CM | POA: Insufficient documentation

## 2024-03-26 DIAGNOSIS — H524 Presbyopia: Secondary | ICD-10-CM | POA: Diagnosis not present

## 2024-04-15 ENCOUNTER — Ambulatory Visit

## 2024-04-15 VITALS — Ht 69.0 in | Wt 173.0 lb

## 2024-04-15 DIAGNOSIS — Z0000 Encounter for general adult medical examination without abnormal findings: Secondary | ICD-10-CM

## 2024-04-15 NOTE — Progress Notes (Cosign Needed Addendum)
 Subjective:   David Cunningham is a 68 y.o. who presents for a Medicare Wellness preventive visit.  As a reminder, Annual Wellness Visits don't include a physical exam, and some assessments may be limited, especially if this visit is performed virtually. We may recommend an in-person follow-up visit with your provider if needed.  Visit Complete: Virtual I connected with  [name] on 04/15/24 by a audio enabled telemedicine application and verified that I am speaking with the correct person using two identifiers.  Patient Location: Home  Provider Location: Home Office  I discussed the limitations of evaluation and management by telemedicine. The patient expressed understanding and agreed to proceed.  Vital Signs: Because this visit was a virtual/telehealth visit, some criteria may be missing or patient reported. Any vitals not documented were not able to be obtained and vitals that have been documented are patient reported.  VideoDeclined- This patient declined Librarian, academic. Therefore the visit was completed with audio only.  Persons Participating in Visit: Patient.  AWV Questionnaire: Yes: Patient Medicare AWV questionnaire was completed by the patient on 04/08/24; I have confirmed that all information answered by patient is correct and no changes since this date.  Cardiac Risk Factors include: advanced age (>30men, >59 women);smoking/ tobacco exposure;hypertension     Objective:     Today's Vitals   04/15/24 0815  Weight: 173 lb (78.5 kg)  Height: 5\' 9"  (1.753 m)   Body mass index is 25.55 kg/m.     04/15/2024    9:50 AM 09/25/2019    8:09 AM 09/23/2019    4:49 PM 10/10/2016   10:33 AM  Advanced Directives  Does Patient Have a Medical Advance Directive? No Yes Yes Yes  Type of Special educational needs teacher of [place];Living will Living will;Healthcare Power of Attorney Living will;Healthcare Power of Attorney  Does patient want  to make changes to medical advance directive?  No - Patient declined No - Patient declined   Copy of Healthcare Power of Attorney in Chart?  No - copy requested    Would patient like information on creating a medical advance directive? Yes (MAU/Ambulatory/Procedural Areas - Information given)       Current Medications (verified) Outpatient Encounter Medications as of 04/15/2024  Medication Sig   amLODipine  (NORVASC ) 10 MG tablet TAKE 1 TABLET BY MOUTH DAILY   azelastine  (ASTELIN ) 0.1 % nasal spray Place 2 sprays into both nostrils 2 (two) times daily. Use in each nostril as directed   diclofenac  Sodium (VOLTAREN ) 1 % GEL Apply 2 g topically 4 (four) times daily.   fexofenadine  (ALLEGRA ) 180 MG tablet Take 1 tablet (180 mg total) by mouth daily.   fluticasone  (FLONASE ) 50 MCG/ACT nasal spray Place 2 sprays into both nostrils daily.   losartan  (COZAAR ) 100 MG tablet TAKE 1 TABLET BY MOUTH DAILY   montelukast  (SINGULAIR ) 10 MG tablet Take 1 tablet (10 mg total) by mouth at bedtime.   naproxen sodium (ALEVE) 220 MG tablet Take 220 mg by mouth 2 (two) times daily as needed.   sodium chloride  (OCEAN) 0.65 % SOLN nasal spray Place 1 spray into both nostrils as needed for congestion.   tobramycin  (TOBREX ) 0.3 % ophthalmic solution Place 2 drops into the right eye every 4 (four) hours.   No facility-administered encounter medications on file as of 04/15/2024.    Allergies (verified) Patient has no known allergies.   History: Past Medical History:  Diagnosis Date   Allergy    Arthritis  hand, neck   Back pain 09/11/2016   patient denies back pain but has neck pain   Bicuspid aortic valve    DEGENERATIVE DISC DISEASE, CERVICAL SPINE    Heart murmur    never has caused any problems   History of chicken pox    Hypertension    Migraines    last one 2 wks ago- allergy related    MITRAL VALVE PROLAPSE    PSA, INCREASED    TINNITUS, CHRONIC, BILATERAL    Past Surgical History:  Procedure  Laterality Date   CERVICAL DISC SURGERY  12/2019   CERVICAL SPINE SURGERY     COLONOSCOPY  2012   [place] hx polyps   CYST REMOVAL NECK     And Face   NASAL SEPTOPLASTY W/ TURBINOPLASTY Bilateral 09/25/2019   Procedure: NASAL SEPTOPLASTY WITH TURBINATE REDUCTION;  Surgeon: Ammon Bales, MD;  Location: [facility];  Service: ENT;  Laterality: Bilateral;   POLYPECTOMY     Colon   PROSTATE BIOPSY  06/2019   negative   SINUS ENDO WITH FUSION Bilateral 09/25/2019   Procedure: ENDOSCOPIC SINUS SURGERY WITH FUSION NAVIGATION;  Surgeon: Ammon Bales, MD;  Location:  SURGERY CENTER;  Service: ENT;  Laterality: Bilateral;   SPINE SURGERY  06/17/2019   Tonsillectome     WISDOM TOOTH EXTRACTION     Family History  Problem Relation Age of Onset   Colon cancer Mother 56       Deceased   Lung cancer Mother    Liver cancer Mother    Cancer Mother    Breast cancer Sister    Cancer Sister    Lymphoma Father 50       Deceased   Diabetes Father    Cancer Father    Cancer Other        Paternal Grandparents   Cancer Other        Maternal Grandparents   Emphysema Paternal Grandfather    Emphysema Maternal Grandfather    Cancer Paternal Aunt    Cancer Maternal Aunt    Esophageal cancer Neg Hx    Stomach cancer Neg Hx    Rectal cancer Neg Hx    Social History   Socioeconomic History   Marital status: Married    Spouse name: Not on file   Number of children: Not on file   Years of education: Not on file   Highest education level: Bachelor's degree (e.g., BA, AB, BS)  Occupational History   Occupation: Retired  Tobacco Use   Smoking status: Every Day    Current packs/day: 1.00    Average packs/day: 1 pack/day for 40.0 years (40.0 ttl pk-yrs)    Types: Cigarettes   Smokeless tobacco: Never  Vaping Use   Vaping status: Never Used  Substance and Sexual Activity   Alcohol use: Yes    Alcohol/week: 14.0 standard drinks of alcohol    Types: 7 Glasses  of wine, 7 Shots of liquor per week    Comment: 1 glass of scotch daily   Drug use: No   Sexual activity: Yes    Partners: Female    Comment: wife  Other Topics Concern   Not on file  Social History Narrative   Not on file   Social Drivers of Health   Financial Resource Strain: Low Risk  (04/08/2024)   Overall Financial Resource Strain (CARDIA)    Difficulty of Paying Living Expenses: Not hard at all  Food Insecurity: No Food Insecurity (04/08/2024)  Hunger Vital Sign    Worried About Running Out of Food in the Last Year: Never true    Ran Out of Food in the Last Year: Never true  Transportation Needs: No Transportation Needs (04/08/2024)   PRAPARE - Administrator, Civil Service (Medical): No    Lack of Transportation (Non-Medical): No  Physical Activity: Sufficiently Active (04/08/2024)   Exercise Vital Sign    Days of Exercise per Week: 5 days    Minutes of Exercise per Session: 120 min  Stress: No Stress Concern Present (04/08/2024)   [employer] of Occupational Health - Occupational Stress Questionnaire    Feeling of Stress : Not at all  Social Connections: Moderately Isolated (04/08/2024)   Social Connection and Isolation Panel [NHANES]    Frequency of Communication with Friends and Family: Three times a week    Frequency of Social Gatherings with Friends and Family: Three times a week    Attends Religious Services: Never    Active Member of Clubs or Organizations: No    Attends Engineer, structural: Not on file    Marital Status: Married    Tobacco Counseling Ready to quit: Not Answered Counseling given: Not Answered    Clinical Intake:  Pre-visit preparation completed: Yes  Pain : No/denies pain     Diabetes: No  Lab Results  Component Value Date   HGBA1C 5.4 02/14/2023   HGBA1C 5.4 01/19/2020   HGBA1C 5.6 01/16/2019     How often do you need to have someone help you when you read instructions, pamphlets, or other written  materials from your doctor or pharmacy?: 1 - Never  Interpreter Needed?: No  Information entered by :: Seabron Cypress LPN   Activities of Daily Living     04/08/2024    3:06 PM  In your present state of health, do you have any difficulty performing the following activities:  Hearing? 0  Vision? 0  Difficulty concentrating or making decisions? 0  Walking or climbing stairs? 0  Dressing or bathing? 0  Doing errands, shopping? 0  Preparing Food and eating ? N  Using the Toilet? N  In the past six months, have you accidently leaked urine? N  Do you have problems with loss of bowel control? N  Managing your Medications? N  Managing your Finances? N  Housekeeping or managing your Housekeeping? N    Patient Care Team: Saguier, Edward, PA-C as PCP - General (Internal Medicine) Loyde Rule, MD as PCP - Cardiology (Cardiology) Homero Luster, MD as Attending Physician (Urology) [facility], [facility]   I have updated your Care Teams any recent Medical Services you may have received from other providers in the past year.     Assessment:    This is a routine wellness examination for Argenis.  Hearing/Vision screen Hearing Screening - Comments:: Denies hearing difficulties   Vision Screening - Comments:: Wears rx glasses - up to date with routine eye exams with [facility] [place]   Goals Addressed             This Visit's Progress    Remain active and independent         Depression Screen     04/15/2024    9:49 AM 04/18/2023    8:24 AM 04/04/2022    8:52 AM 03/31/2021    9:07 AM 01/19/2020    8:29 AM 01/16/2019   10:26 AM 01/14/2018    8:28 AM  PHQ 2/9 Scores  PHQ - 2 Score 0 0 0 0 0 0 0  PHQ- 9 Score      0     Fall Risk     04/08/2024    3:06 PM 04/18/2023    8:24 AM 04/04/2022    8:52 AM 03/31/2021    9:07 AM 01/16/2019   10:28 AM  Fall Risk   Falls in the past year? 0 0 0 0 0  Number falls in past yr: 0 0 0  0  Injury with Fall? 0 0 0  0   Risk for fall due to : No Fall Risks No Fall Risks     Follow up Falls prevention discussed;Education provided;Falls evaluation completed Falls evaluation completed;Education provided   Falls evaluation completed    MEDICARE RISK AT HOME:  Medicare Risk at Home Any stairs in or around the home?: (Patient-Rptd) Yes If so, are there any without handrails?: (Patient-Rptd) No Home free of loose throw rugs in walkways, pet beds, electrical cords, etc?: (Patient-Rptd) Yes Adequate lighting in your home to reduce risk of falls?: (Patient-Rptd) Yes Life alert?: (Patient-Rptd) No Use of a cane, walker or w/c?: (Patient-Rptd) No Grab bars in the bathroom?: (Patient-Rptd) No Shower chair or bench in shower?: (Patient-Rptd) No Elevated toilet seat or a handicapped toilet?: (Patient-Rptd) No  TIMED UP AND GO:  Was the test performed?  No  Cognitive Function: 6CIT completed        04/15/2024    9:50 AM  6CIT Screen  What Year? 0 points  What month? 0 points  What time? 0 points  Count back from 20 0 points  Months in reverse 0 points  Repeat phrase 0 points  Total Score 0 points    Immunizations Immunization History  Administered Date(s) Administered   Influenza Split 08/23/2012   Influenza,inj,Quad PF,6+ Mos 09/09/2013, 10/20/2015, 09/04/2016   PFIZER(Purple Top)SARS-COV-2 Vaccination 01/19/2020, 02/09/2020, 08/25/2021   Pfizer Covid-19 Vaccine Bivalent Booster 26yrs & up 08/02/2021   Td 11/06/2006   Tdap 12/29/2016   Zoster Recombinant(Shingrix ) 04/04/2022, 06/06/2022    Screening Tests Health Maintenance  Topic Date Due   Hepatitis C Screening  Never done   Pneumonia Vaccine 98+ Years old (1 of 2 - PCV) Never done   COVID-19 Vaccine (4 - 2024-25 season) 07/08/2023   INFLUENZA VACCINE  06/06/2024   Colonoscopy  10/26/2024   Lung Cancer Screening  02/26/2025   Medicare Annual Wellness (AWV)  04/15/2025   DTaP/Tdap/Td (3 - Td or Tdap) 12/29/2026   HIV Screening   Completed   Zoster Vaccines- Shingrix   Completed   HPV VACCINES  Aged Out   Meningococcal B Vaccine  Aged Out    Health Maintenance  Health Maintenance Due  Topic Date Due   Hepatitis C Screening  Never done   Pneumonia Vaccine 31+ Years old (1 of 2 - PCV) Never done   COVID-19 Vaccine (4 - 2024-25 season) 07/08/2023   Health Maintenance Items Addressed: Information provided on prevnar  Additional Screening:  Vision Screening: Recommended annual ophthalmology exams for early detection of glaucoma and other disorders of the eye. Would you like a referral to an eye doctor? No    Dental Screening: Recommended annual dental exams for proper oral hygiene  Community Resource Referral / Chronic Care Management: CRR required this visit?  No   CCM required this visit?  No   Plan:    I have personally reviewed and noted the following in the patient's chart:   Medical and social  history Use of alcohol, tobacco or illicit drugs  Current medications and supplements including opioid prescriptions. Patient is not currently taking opioid prescriptions. Functional ability and status Nutritional status Physical activity Advanced directives List of other physicians Hospitalizations, surgeries, and ER visits in previous 12 months Vitals Screenings to include cognitive, depression, and falls Referrals and appointments  In addition, I have reviewed and discussed with patient certain preventive protocols, quality metrics, and best practice recommendations. A written personalized care plan for preventive services as well as general preventive health recommendations were provided to patient.   Seabron Cypress [place], [place]   1/61/0960   After Visit Summary: (MyChart) Due to this being a telephonic visit, the after visit summary with patients personalized plan was offered to patient via MyChart   Notes: Nothing significant to report at this time.

## 2024-04-15 NOTE — Patient Instructions (Signed)
 David Cunningham , Thank you for taking time out of your busy schedule to complete your Annual Wellness Visit with me. I enjoyed our conversation and look forward to speaking with you again next year. I, as well as your care team,  appreciate your ongoing commitment to your health goals. Please review the following plan we discussed and let me know if I can assist you in the future. Your Game plan/ To Do List    Follow up Visits: Next Medicare AWV with our clinical staff: In 1 year    Have you seen your provider in the last 6 months (3 months if uncontrolled diabetes)? No Next Office Visit with your provider: 04/18/24 @ 8:20  Clinician Recommendations:  Aim for 30 minutes of exercise or brisk walking, 6-8 glasses of water, and 5 servings of fruits and vegetables each day.       This is a list of the screening recommended for you and due dates:  Health Maintenance  Topic Date Due   Hepatitis C Screening  Never done   Pneumonia Vaccine (1 of 2 - PCV) Never done   COVID-19 Vaccine (4 - 2024-25 season) 07/08/2023   Flu Shot  06/06/2024   Colon Cancer Screening  10/26/2024   Screening for Lung Cancer  02/26/2025   Medicare Annual Wellness Visit  04/15/2025   DTaP/Tdap/Td vaccine (3 - Td or Tdap) 12/29/2026   HIV Screening  Completed   Zoster (Shingles) Vaccine  Completed   HPV Vaccine  Aged Out   Meningitis B Vaccine  Aged Out    Advanced directives: (ACP Link)Information on Advanced Care Planning can be found at Tour manager Advance Health Care Directives Advance Health Care Directives. [email or web address]   Advance Care Planning is important because it:  [x]  Makes sure you receive the medical care that is consistent with your values, goals, and preferences  [x]  It provides guidance to your family and loved ones and reduces their decisional burden about whether or not they are making the right decisions based on your wishes.  Follow the link provided in your after visit summary  or read over the paperwork we have mailed to you to help you started getting your Advance Directives in place. If you need assistance in completing these, please reach out to us  so that we can help you!  See attachments for Preventive Care and Fall Prevention Tips.

## 2024-04-18 ENCOUNTER — Encounter: Payer: Self-pay | Admitting: Medical

## 2024-04-18 ENCOUNTER — Ambulatory Visit (INDEPENDENT_AMBULATORY_CARE_PROVIDER_SITE_OTHER): Admitting: Medical

## 2024-04-18 VITALS — BP 119/70 | HR 57 | Temp 98.1°F | Ht 69.0 in | Wt 170.4 lb

## 2024-04-18 DIAGNOSIS — J3489 Other specified disorders of nose and nasal sinuses: Secondary | ICD-10-CM | POA: Diagnosis not present

## 2024-04-18 DIAGNOSIS — I10 Essential (primary) hypertension: Secondary | ICD-10-CM | POA: Diagnosis not present

## 2024-04-18 DIAGNOSIS — R739 Hyperglycemia, unspecified: Secondary | ICD-10-CM | POA: Diagnosis not present

## 2024-04-18 DIAGNOSIS — Z8669 Personal history of other diseases of the nervous system and sense organs: Secondary | ICD-10-CM | POA: Diagnosis not present

## 2024-04-18 DIAGNOSIS — I7090 Unspecified atherosclerosis: Secondary | ICD-10-CM

## 2024-04-18 DIAGNOSIS — R972 Elevated prostate specific antigen [PSA]: Secondary | ICD-10-CM | POA: Diagnosis not present

## 2024-04-18 DIAGNOSIS — J309 Allergic rhinitis, unspecified: Secondary | ICD-10-CM

## 2024-04-18 DIAGNOSIS — R0981 Nasal congestion: Secondary | ICD-10-CM

## 2024-04-18 MED ORDER — FLUTICASONE PROPIONATE 50 MCG/ACT NA SUSP: 2.0000 | Freq: Every day | NASAL | 1 refills | Status: AC

## 2024-04-18 NOTE — Patient Instructions (Signed)
 Migraine Headaches hx. Intermittent migraines with aura, photophobia, and nausea. Severity decreased since youth, currently manageable. Triptans likely  contraindicated due to cardiovascular risk.(see mri of head study showing carotids in past) - Monitor headache frequency and severity. - Consider neurologist referral if headaches worsen. - Avoid triptans due to cardiovascular risk. -can continue alleve presently with caution.   Arthritis Chronic arthritis managed with daily NSAID (Aleve). Discussed risks of chronic NSAID use, including hypertension and renal impact. Blood pressure and renal function well-managed. - Continue Aleve for arthritis management. - Check renal function every 3-6 months. - Ensure adequate hydration.  Sinus Congestion Mild sinus congestion, likely weather or allergy-related. No significant symptoms warranting antibiotics. - Use Flonase  nasal spray as needed. - Monitor symptoms and update if he worsens. -considered/offered  zpack. Declining presently.  Elevated PSA Previously elevated PSA, now 3.5. Negative prostate biopsy, under urologist care. - Continue annual follow-up with urologist.  General Health Maintenance Discussed vaccinations and screenings. Considering pneumonia vaccine, declined hepatitis C screening and COVID vaccine. Blood pressure and renal function well-managed. - Offer PCV20 pneumonia vaccine if desired. - Check renal function and A1c with current blood work. - Schedule follow-up every 6 months.

## 2024-04-18 NOTE — Progress Notes (Signed)
 Subjective:    Patient ID: David David, male    DOB: 1956/10/19, 68 y.o.   MRN: [id number]  HPI David David is a 68 year old male who presents with nasal congestion and headache.  He has experienced nasal congestion for the past three days, which he describes as mild. The mucus is clear, and there is no sneezing, body aches, fevers, chills, or sweats.  He has a history of migraines that began in high school, characterized by aura and light sensitivity. These migraines have become less severe over time. He experienced a mild migraine yesterday, which did not prevent him from functioning. These headaches occur intermittently, sometimes months apart, and can cluster in short periods. He did not take any specific medication for the migraine yesterday but takes Aleve daily for arthritis.  He has a history of dental issues, including a tooth extraction and root canal on the upper left side earlier this year due to a severe tooth infection. He occasionally feels pressure in the maxillary area.  He takes Aleve daily for arthritis in his hands, specifically two over-the-counter tablets each morning. He has been using this medication for years without noted adverse effects on kidney function, which was last checked a year ago with a GFR of 82.  He has a history of elevated PSA levels and has been followed by a urologist. A biopsy performed four to five years ago was negative, and his PSA has decreased to around 3.5. He has been on antibiotics like Bactrim  in the past for this issue.    Review of Systems  Constitutional:  Negative for chills, fatigue and fever.  HENT:  Positive for congestion.   Respiratory:  Negative for cough, chest tightness and wheezing.   Cardiovascular:  Negative for chest pain and palpitations.  Gastrointestinal:  Negative for abdominal pain, blood in stool and constipation.  Genitourinary:  Negative for dysuria, frequency and penile swelling.  Musculoskeletal:   Negative for back pain, joint swelling and neck stiffness.  Skin:  Negative for rash.  Neurological:  Negative for dizziness, syncope, weakness, numbness and headaches.        No ha presently.  Hematological:  Negative for adenopathy. Does not bruise/bleed easily.  Psychiatric/Behavioral:  Negative for behavioral problems, decreased concentration and suicidal ideas. The patient is not nervous/anxious.    Past Medical History:  Diagnosis Date   Allergy    Arthritis    hand, neck   Back pain 09/11/2016   patient denies back pain but has neck pain   Bicuspid aortic valve    DEGENERATIVE DISC DISEASE, CERVICAL SPINE    Heart murmur    never has caused any problems   History of chicken pox    Hypertension    Migraines    last one 2 wks ago- allergy related    MITRAL VALVE PROLAPSE    PSA, INCREASED    TINNITUS, CHRONIC, BILATERAL      Social History   Socioeconomic History   Marital status: Married    Spouse name: Not on file   Number of children: Not on file   Years of education: Not on file   Highest education level: Bachelor's degree (e.g., BA, AB, BS)  Occupational History   Occupation: Retired  Tobacco Use   Smoking status: Every Day    Current packs/day: 1.00    Average packs/day: 1 pack/day for 40.0 years (40.0 ttl pk-yrs)    Types: Cigarettes   Smokeless tobacco: Never  Vaping Use  Vaping status: Never Used  Substance and Sexual Activity   Alcohol use: Yes    Alcohol/week: 14.0 standard drinks of alcohol    Types: 7 Glasses of wine, 7 Shots of liquor per week    Comment: 1 glass of scotch daily   Drug use: No   Sexual activity: Yes    Partners: Female    Comment: wife  Other Topics Concern   Not on file  Social History Narrative   Not on file   Social Drivers of Health   Financial Resource Strain: Low Risk  (04/17/2024)   Overall Financial Resource Strain (CARDIA)    Difficulty of Paying Living Expenses: Not hard at all  Food Insecurity: No Food  Insecurity (04/17/2024)   Hunger Vital Sign    Worried About Running Out of Food in the Last Year: Never true    Ran Out of Food in the Last Year: Never true  Transportation Needs: No Transportation Needs (04/17/2024)   PRAPARE - Administrator, Civil Service (Medical): No    Lack of Transportation (Non-Medical): No  Physical Activity: Sufficiently Active (04/17/2024)   Exercise Vital Sign    Days of Exercise per Week: 5 days    Minutes of Exercise per Session: 120 min  Stress: No Stress Concern Present (04/17/2024)   [employer] of Occupational Health - Occupational Stress Questionnaire    Feeling of Stress: Not at all  Social Connections: Moderately Isolated (04/17/2024)   Social Connection and Isolation Panel    Frequency of Communication with Friends and Family: Three times a week    Frequency of Social Gatherings with Friends and Family: Three times a week    Attends Religious Services: Never    Active Member of Clubs or Organizations: No    Attends Banker Meetings: Not on file    Marital Status: Married  Intimate Partner Violence: Not At Risk (04/15/2024)   Humiliation, Afraid, Rape, and Kick questionnaire    Fear of Current or Ex-Partner: No    Emotionally Abused: No    Physically Abused: No    Sexually Abused: No    Past Surgical History:  Procedure Laterality Date   CERVICAL DISC SURGERY  12/2019   CERVICAL SPINE SURGERY     COLONOSCOPY  2012   [place] hx polyps   CYST REMOVAL NECK     And Face   NASAL SEPTOPLASTY W/ TURBINOPLASTY Bilateral 09/25/2019   Procedure: NASAL SEPTOPLASTY WITH TURBINATE REDUCTION;  Surgeon: Ammon Bales, MD;  Location: [facility];  Service: ENT;  Laterality: Bilateral;   POLYPECTOMY     Colon   PROSTATE BIOPSY  06/2019   negative   SINUS ENDO WITH FUSION Bilateral 09/25/2019   Procedure: ENDOSCOPIC SINUS SURGERY WITH FUSION NAVIGATION;  Surgeon: Ammon Bales, MD;  Location: [facility];  Service: ENT;  Laterality: Bilateral;   SPINE SURGERY  06/17/2019   Tonsillectome     WISDOM TOOTH EXTRACTION      Family History  Problem Relation Age of Onset   Colon cancer Mother 77       Deceased   Lung cancer Mother    Liver cancer Mother    Cancer Mother    Breast cancer Sister    Cancer Sister    Lymphoma Father 74       Deceased   Diabetes Father    Cancer Father    Cancer Other        Paternal Grandparents  Cancer Other        Maternal Grandparents   Emphysema Paternal Grandfather    Emphysema Maternal Grandfather    Cancer Paternal Aunt    Cancer Maternal Aunt    Esophageal cancer Neg Hx    Stomach cancer Neg Hx    Rectal cancer Neg Hx     No Known Allergies  Current Outpatient Medications on File Prior to Visit  Medication Sig Dispense Refill   amLODipine  (NORVASC ) 10 MG tablet TAKE 1 TABLET BY MOUTH DAILY 90 tablet 2   diclofenac  Sodium (VOLTAREN ) 1 % GEL Apply 2 g topically 4 (four) times daily. 100 g 1   fexofenadine  (ALLEGRA ) 180 MG tablet Take 1 tablet (180 mg total) by mouth daily. 90 tablet 0   fluticasone  (FLONASE ) 50 MCG/ACT nasal spray Place 2 sprays into both nostrils daily. 16 g 1   losartan  (COZAAR ) 100 MG tablet TAKE 1 TABLET BY MOUTH DAILY 90 tablet 1   naproxen sodium (ALEVE) 220 MG tablet Take 220 mg by mouth 2 (two) times daily as needed.     azelastine  (ASTELIN ) 0.1 % nasal spray Place 2 sprays into both nostrils 2 (two) times daily. Use in each nostril as directed 30 mL 0   montelukast  (SINGULAIR ) 10 MG tablet Take 1 tablet (10 mg total) by mouth at bedtime. 30 tablet 3   sodium chloride  (OCEAN) 0.65 % SOLN nasal spray Place 1 spray into both nostrils as needed for congestion.     tobramycin  (TOBREX ) 0.3 % ophthalmic solution Place 2 drops into the right eye every 4 (four) hours. 5 mL 0   No current facility-administered medications on file prior to visit.    BP 119/70   Pulse (!) 57   Temp 98.1 F (36.7 C) (Oral)    Ht 5' 9 (1.753 m)   Wt 170 lb 6 oz (77.3 kg)   SpO2 97%   BMI 25.16 kg/m          Objective:   Physical Exam  General Mental Status- Alert. General Appearance- Not in acute distress.   Skin General: Color- Normal Color. Moisture- Normal Moisture.  Neck Carotid Arteries- Normal color. Moisture- Normal Moisture. No carotid bruits. No JVD.  Chest and Lung Exam Auscultation: Breath Sounds:-Normal.  Cardiovascular Auscultation:Rythm- Regular. Murmurs & Other Heart Sounds:Auscultation of the heart reveals- No Murmurs.  Abdomen Inspection:-Inspeection Normal. Palpation/Percussion:Note:No mass. Palpation and Percussion of the abdomen reveal- Non Tender, Non Distended + BS, no rebound or guarding.    Neurologic Cranial Nerve exam:- CN III-XII intact(No nystagmus), symmetric smile. Drift Test:- No drift. Romberg Exam:- Negative.  Heal to Toe Gait exam:-Normal. Finger to Nose:- Normal/Intact Strength:- 5/5 equal and symmetric strength both upper and lower extremities.       Assessment & Plan:   Patient Instructions  Migraine Headaches hx. Intermittent migraines with aura, photophobia, and nausea. Severity decreased since youth, currently manageable. Triptans likely  contraindicated due to cardiovascular risk.(see mri of head study showing carotids in past) - Monitor headache frequency and severity. - Consider neurologist referral if headaches worsen. - Avoid triptans due to cardiovascular risk. -can continue alleve presently with caution.   Arthritis Chronic arthritis managed with daily NSAID (Aleve). Discussed risks of chronic NSAID use, including hypertension and renal impact. Blood pressure and renal function well-managed. - Continue Aleve for arthritis management. - Check renal function every 3-6 months. - Ensure adequate hydration.  Sinus Congestion Mild sinus congestion, likely weather or allergy-related. No significant symptoms warranting antibiotics. -  Use Flonase  nasal spray as needed. - Monitor symptoms and update if he worsens. -considered/offered  zpack. Declining presently.  Elevated PSA Previously elevated PSA, now 3.5. Negative prostate biopsy, under urologist care. - Continue annual follow-up with urologist.  General Health Maintenance Discussed vaccinations and screenings. Considering pneumonia vaccine, declined hepatitis C screening and COVID vaccine. Blood pressure and renal function well-managed. - Offer PCV20 pneumonia vaccine if desired. - Check renal function and A1c with current blood work. - Schedule follow-up every 6 months.   Adelma Bowdoin, PA-C

## 2024-04-19 ENCOUNTER — Ambulatory Visit: Payer: Self-pay | Admitting: Medical

## 2024-04-19 LAB — COMPREHENSIVE METABOLIC PANEL WITH GFR
AG Ratio: 2 (calc) (ref 1.0–2.5)
ALT: 17 U/L (ref 9–46)
AST: 21 U/L (ref 10–35)
Albumin: 4.5 g/dL (ref 3.6–5.1)
Alkaline phosphatase (APISO): 61 U/L (ref 35–144)
BUN: 18 mg/dL (ref 7–25)
CO2: 26 mmol/L (ref 20–32)
Calcium: 9.1 mg/dL (ref 8.6–10.3)
Chloride: 106 mmol/L (ref 98–110)
Creat: 0.99 mg/dL (ref 0.70–1.35)
Globulin: 2.2 g/dL (ref 1.9–3.7)
Glucose, Bld: 90 mg/dL (ref 65–99)
Potassium: 5 mmol/L (ref 3.5–5.3)
Sodium: 141 mmol/L (ref 135–146)
Total Bilirubin: 0.6 mg/dL (ref 0.2–1.2)
Total Protein: 6.7 g/dL (ref 6.1–8.1)
eGFR: 85 mL/min/{1.73_m2} (ref >=60)

## 2024-04-19 LAB — HEMOGLOBIN A1C
Hgb A1c MFr Bld: 5.5 % (ref <5.7)
Mean Plasma Glucose: 111 mg/dL
eAG (mmol/L): 6.2 mmol/L

## 2024-04-19 LAB — LIPID PANEL
Cholesterol: 161 mg/dL (ref <200)
HDL: 85 mg/dL (ref >=40)
LDL Cholesterol (Calc): 64 mg/dL
Non-HDL Cholesterol (Calc): 76 mg/dL (ref <130)
Total CHOL/HDL Ratio: 1.9 (calc) (ref <5.0)
Triglycerides: 50 mg/dL (ref <150)

## 2024-04-19 NOTE — Addendum Note (Signed)
 Addended by: Serafina Damme on: 04/19/2024 08:29 AM   Modules accepted: Orders

## 2024-04-29 NOTE — Progress Notes (Addendum)
 [facility] Quality Team Note  Name: David Cunningham Date of Birth: 31-Mar-1956 MRN: [id number] Date: 04/29/2024  [facility] Quality Team has reviewed this patient's chart, please see recommendations below:  [facility] Quality Other; (CHART REVIEWED FOR CONTROLLING BLOOD PRESSURE MEASURE. ABSTRACTED MOST RECENT, COMPLIANT BLOOD PRESSURE.)  12/12/2024- CBP out of range.

## 2024-05-24 ENCOUNTER — Other Ambulatory Visit: Payer: Self-pay | Admitting: Medical

## 2024-05-29 DIAGNOSIS — K080 Exfoliation of teeth due to systemic causes: Secondary | ICD-10-CM | POA: Diagnosis not present

## 2024-06-03 ENCOUNTER — Telehealth: Payer: Self-pay | Admitting: Cardiovascular Disease

## 2024-06-03 DIAGNOSIS — I351 Nonrheumatic aortic (valve) insufficiency: Secondary | ICD-10-CM

## 2024-06-03 DIAGNOSIS — I350 Nonrheumatic aortic (valve) stenosis: Secondary | ICD-10-CM

## 2024-06-03 NOTE — Telephone Encounter (Signed)
 Spoke to patient stated he was suppose to have echo done this month.Stated he never got a reminder letter to schedule.Stated he would like to have done before his appointment with Dr.Nishan 10/14.Advised order placed.Scheduler will call back with appointment.

## 2024-06-03 NOTE — Telephone Encounter (Signed)
 Pt sent a message in today, he scheduled his yearly follow up for 08/19/24. He was told to have an echo this year as well, can an order be placed for this to have prior to appt.

## 2024-06-06 ENCOUNTER — Ambulatory Visit (HOSPITAL_COMMUNITY)
Admission: RE | Admit: 2024-06-06 | Discharge: 2024-06-06 | Disposition: A | Discharge status: Home / Self Care | Source: Ambulatory Visit | Attending: Cardiology | Admitting: Cardiology

## 2024-06-06 DIAGNOSIS — I350 Nonrheumatic aortic (valve) stenosis: Secondary | ICD-10-CM | POA: Insufficient documentation

## 2024-06-06 DIAGNOSIS — I351 Nonrheumatic aortic (valve) insufficiency: Secondary | ICD-10-CM | POA: Diagnosis not present

## 2024-06-06 LAB — ECHOCARDIOGRAM COMPLETE
AR max vel: 1.89 cm2
AV Area VTI: 1.83 cm2
AV Area mean vel: 1.93 cm2
AV Mean grad: 25 mmHg
AV Peak grad: 44.9 mmHg
Ao pk vel: 3.35 m/s
Area-P 1/2: 4.1 cm2
S' Lateral: 2.8 cm

## 2024-06-07 ENCOUNTER — Ambulatory Visit: Payer: Self-pay | Admitting: Cardiovascular Disease

## 2024-06-07 DIAGNOSIS — I351 Nonrheumatic aortic (valve) insufficiency: Secondary | ICD-10-CM

## 2024-06-07 DIAGNOSIS — I350 Nonrheumatic aortic (valve) stenosis: Secondary | ICD-10-CM

## 2024-06-07 NOTE — Progress Notes (Signed)
 EF still normal AR moderate appearing mild.mod AS echo looks better than last F/u echo in a year Call if any dyspnea

## 2024-08-11 NOTE — Progress Notes (Signed)
 Date:  08/19/2024   ID:  David Cunningham, DOB 1956-03-02, MRN [id number]   Provider Location: Office  PCP:  Dorina Dallas RIGGERS  Cardiologist:  Delford Electrophysiologist:  None   Evaluation Performed:  Follow-Up Visit In person   Chief Complaint:  AV Disease  History of Present Illness:     68 y.o. f/u for bicuspid AV disease. No family history of cardiac disease. No history of CAD or chest pain.  Poor diet with excess salt, smokes and has scotch most nights. Normal ETT 10/09/12 with HTN response.  TTE: 05/12/22 EF 60-65% grade 2 diastolic mild/moderate MR ? Myxomatous bicuspid AV with severe AR moderate AS mean gradient 23 mmHg peak 40 mmHg DVI 0.34 AR T1/2 114 msec Progression of AR since 2022 His pressure half time < 250 msec and holodiastolic reversal in ascending aorta LV dimensions 39 mm ES and 56 mm ED    Cardiac CTA 05/25/20 calcium  score 22 isolated to LAD only 27 th percentile for age/sex. Left dominant with no obstructive disease CAD RADS 1 AV calcium  score 534 bicuspid valve fused right and left cusps Ascending aortic root 4.2 cm    CT head HP 02/2015 no aneurysm normal carotid system   Dentition in good shape  Youngest son graduated from [employer] And is home now  Step daughter in [place] and oldest son in [place]   He feels great retired 2022 from [employer] business   I showed him his latest TTE images. Discussed natural history of severe AR and indications for surgery including symptoms, high ED/ES dimensions , low EF Aortic aneurysm > 5 cm in setting of bicuspid valve Currently he feels great. Using treadmill 30 minutes 3x/week and lifting weights Moving yard and active. No CHF/Dyspnea, chest pain palpitations or syncope. BP elevated discussed increasing norvasc  to 10 mg.   Also discussed smoking cessation CT chest 12/27/22 new pleural based 5 mm LUL nodule with ILD   Cardiopulmonary stress test: 04/26/22  showed low normal VO2 24.5 ml/kg/min 82% predicted no cardiac  limitations some pulmonary restrictive limitations   Retired will be on medicare  Active at gym no cardiac symptoms  TTE 05/15/23 severe AR mean gradient 23.6 peak 41 mmHg Aortic root 4.2 and 4.3 on lung CT above EDD 55 ESD 30 mm   Has had tooth pulled and some sinusitis Has regular dental f/u  He deferred MRI for further evaluation of LV volumes and AR RF/RV. Had MRI in past and was difficult for him.   Echo 06/06/24 EF normal with mild.mod AS and moderate AR mild LVH no LVE. Mean gradient 25 peak 44.9 mmhg DVI 0.30 and AVA 1.8 cm2. LVIDd 52 mm and LVIDs only 28 mm   No symptoms golfing and working out. Drinking 2 scotch Dew[place] and smoking a ppd    Past Medical History:  Diagnosis Date   Allergy    Arthritis    hand, neck   Back pain 09/11/2016   patient denies back pain but has neck pain   Bicuspid aortic valve    DEGENERATIVE DISC DISEASE, CERVICAL SPINE    Heart murmur    never has caused any problems   History of chicken pox    Hypertension    Migraines    last one 2 wks ago- allergy related    MITRAL VALVE PROLAPSE    PSA, INCREASED    TINNITUS, CHRONIC, BILATERAL    Past Surgical History:  Procedure Laterality Date   CERVICAL DISC  SURGERY  12/2019   CERVICAL SPINE SURGERY     COLONOSCOPY  2012   [place] hx polyps   CYST REMOVAL NECK     And Face   NASAL SEPTOPLASTY W/ TURBINOPLASTY Bilateral 09/25/2019   Procedure: NASAL SEPTOPLASTY WITH TURBINATE REDUCTION;  Surgeon: Mable Lenis, MD;  Location: [facility];  Service: ENT;  Laterality: Bilateral;   POLYPECTOMY     Colon   PROSTATE BIOPSY  06/2019   negative   SINUS ENDO WITH FUSION Bilateral 09/25/2019   Procedure: ENDOSCOPIC SINUS SURGERY WITH FUSION NAVIGATION;  Surgeon: Mable Lenis, MD;  Location: [facility];  Service: ENT;  Laterality: Bilateral;   SPINE SURGERY  06/17/2019   Tonsillectome     WISDOM TOOTH EXTRACTION       Current Meds  Medication Sig   amLODipine   (NORVASC ) 10 MG tablet TAKE 1 TABLET BY MOUTH DAILY   azelastine  (ASTELIN ) 0.1 % nasal spray Place 2 sprays into both nostrils 2 (two) times daily. Use in each nostril as directed   diclofenac  Sodium (VOLTAREN ) 1 % GEL Apply 2 g topically 4 (four) times daily.   fexofenadine  (ALLEGRA ) 180 MG tablet Take 1 tablet (180 mg total) by mouth daily.   fluticasone  (FLONASE ) 50 MCG/ACT nasal spray Place 2 sprays into both nostrils daily.   fluticasone  (FLONASE ) 50 MCG/ACT nasal spray Place 2 sprays into both nostrils daily.   losartan  (COZAAR ) 100 MG tablet TAKE 1 TABLET BY MOUTH DAILY   montelukast  (SINGULAIR ) 10 MG tablet Take 1 tablet (10 mg total) by mouth at bedtime.   naproxen sodium (ALEVE) 220 MG tablet Take 220 mg by mouth 2 (two) times daily as needed.   sodium chloride  (OCEAN) 0.65 % SOLN nasal spray Place 1 spray into both nostrils as needed for congestion.     Allergies:   Patient has no known allergies.   Social History   Tobacco Use   Smoking status: Every Day    Current packs/day: 1.00    Average packs/day: 1 pack/day for 40.0 years (40.0 ttl pk-yrs)    Types: Cigarettes   Smokeless tobacco: Never  Vaping Use   Vaping status: Never Used  Substance Use Topics   Alcohol use: Yes    Alcohol/week: 14.0 standard drinks of alcohol    Types: 7 Glasses of wine, 7 Shots of liquor per week    Comment: 1 glass of scotch daily   Drug use: No     Family Hx: The patient's family history includes Breast cancer in his sister; Cancer in his father, maternal aunt, mother, paternal aunt, sister, and other family members; Colon cancer (age of onset: 108) in his mother; Diabetes in his father; Emphysema in his maternal grandfather and paternal grandfather; Liver cancer in his mother; Lung cancer in his mother; Lymphoma (age of onset: 41) in his father. There is no history of Esophageal cancer, Stomach cancer, or Rectal cancer.  ROS:   Please see the history of present illness.     All other  systems reviewed and are negative.   Prior CV studies:   The following studies were reviewed today:  Echo 06/06/24   IMPRESSIONS     1. Left ventricular ejection fraction, by estimation, is 55 to 60%. Left  ventricular ejection fraction by 3D volume is 59 %. The left ventricle has  normal function. The left ventricle has no regional wall motion  abnormalities. There is mild concentric  left ventricular hypertrophy. Left ventricular diastolic parameters are  indeterminate. The average left ventricular global longitudinal strain is  -19.0 %. The global longitudinal strain is normal.   2. Right ventricular systolic function is normal. The right ventricular  size is normal.   3. Left atrial size was mildly dilated.   4. The mitral valve is abnormal. Trivial mitral valve regurgitation. No  evidence of mitral stenosis.   5. The aortic valve is bicuspid. There is moderate calcification of the  aortic valve. Aortic valve regurgitation is moderate. Mild to moderate  aortic valve stenosis. Aortic valve area, by VTI measures 1.83 cm. Aortic  valve mean gradient measures 25.0  mmHg. Aortic valve Vmax measures 3.35 m/s.   6. Aortic dilatation noted. There is borderline dilatation of the aortic  root, measuring 38 mm.   7. The inferior vena cava is normal in size with greater than 50%  respiratory variability, suggesting right atrial pressure of 3 mmHg.   AV Area (Vmax):    1.89 cm  AV Area (Vmean):   1.93 cm  AV Area (VTI):     1.83 cm  AV Vmax:           335.00 cm/s  AV Vmean:          227.200 cm/s  AV VTI:            0.860 m  AV Peak Grad:      44.9 mmHg  AV Mean Grad:      25.0 mmHg  LVOT Vmax:         103.00 cm/s  LVOT Vmean:        71.300 cm/s  LVOT VTI:          0.256 m  LVOT/AV VTI ratio: 0.30    Labs/Other Tests and Data Reviewed:    EKG:   08/19/2024 NSR rate 58 normal   Recent Labs: 04/18/2024: ALT 17; BUN 18; Creat 0.99; Potassium 5.0; Sodium 141   Recent Lipid  Panel Lab Results  Component Value Date/Time   CHOL 161 04/18/2024 09:25 AM   TRIG 50 04/18/2024 09:25 AM   HDL 85 04/18/2024 09:25 AM   CHOLHDL 1.9 04/18/2024 09:25 AM   LDLCALC 64 04/18/2024 09:25 AM    Wt Readings from Last 3 Encounters:  08/19/24 172 lb 9.6 oz (78.3 kg)  04/18/24 170 lb 6 oz (77.3 kg)  04/15/24 173 lb (78.5 kg)     Objective:    Vital Signs:  BP (!) 143/69 (BP Location: Left Arm, Patient Position: Sitting)   Pulse 70   Ht 5' 9 (1.753 m)   Wt 172 lb 9.6 oz (78.3 kg)   SpO2 99%   BMI 25.49 kg/m    Affect appropriate Healthy:  appears stated age HEENT: normal Neck supple with no adenopathy JVP normal no bruits no thyromegaly Lungs clear with no wheezing and good diaphragmatic motion Heart:  S1/S2 AS/AR murmur, no rub, gallop or click PMI normal Abdomen: benighn, BS positve, no tenderness, no AAA no bruit.  No HSM or HJR Distal pulses intact with no bruits No edema Neuro non-focal Skin warm and dry No muscular weakness   ASSESSMENT & PLAN:    Bicuspid AV:  moderate AS and AR LV compensated and not enlarged Good functional activity f/u echo August 2026 Continue afterload reduction with losartan . Dentist q 6 months Aorta stable on lung cancer CT 4.3 03/12/24  Allergies:  Continue zyrtec consider flonase   F/u primary  ETOH:  Encouraged more moderation in intake labs/LFTls with primary Smoking:  Counseled on smoking cessation for less than 10 minutes Has had script for Chantix  CT 12/27/22 with LUL nodule 5 mm and ILD F/U pulmonary Dr Acquanetta  HTN:  Continue high dose Cozaar  Increase norvasc  to 10 mg  ENT:  Post septoplasty had some sinusitis despite surgery f/u ENT   Aorta:  4.3 cm on lung CT 02/27/24      Medication Adjustments/Labs and Tests Ordered: Current medicines are reviewed at length with the patient today.  Concerns regarding medicines are outlined above.   Tests Ordered:  Echo August 2026 Bicuspid AV AS/AR   Medication Changes: No orders  of the defined types were placed in this encounter.    Disposition:  Follow up in a year  Signed, Maude Emmer, MD  08/19/2024 9:36 AM    [facility]

## 2024-08-19 ENCOUNTER — Ambulatory Visit: Attending: Cardiovascular Disease | Admitting: Cardiovascular Disease

## 2024-08-19 ENCOUNTER — Encounter: Payer: Self-pay | Admitting: Cardiovascular Disease

## 2024-08-19 VITALS — BP 143/69 | HR 70 | Ht 69.0 in | Wt 172.6 lb

## 2024-08-19 DIAGNOSIS — Q2381 Bicuspid aortic valve: Secondary | ICD-10-CM | POA: Diagnosis not present

## 2024-08-19 DIAGNOSIS — Z87891 Personal history of nicotine dependence: Secondary | ICD-10-CM | POA: Diagnosis not present

## 2024-08-19 DIAGNOSIS — I10 Essential (primary) hypertension: Secondary | ICD-10-CM

## 2024-08-19 DIAGNOSIS — I350 Nonrheumatic aortic (valve) stenosis: Secondary | ICD-10-CM

## 2024-08-19 NOTE — Patient Instructions (Signed)
 Medication Instructions:  Your physician recommends that you continue on your current medications as directed. Please refer to the Current Medication list given to you today.  *If you need a refill on your cardiac medications before your next appointment, please call your pharmacy*  Lab Work: NONE  If you have labs (blood work) drawn today and your tests are completely normal, you will receive your results only by: MyChart Message (if you have MyChart) OR A paper copy in the mail If you have any lab test that is abnormal or we need to change your treatment, we will call you to review the results.  Testing/Procedures: AUG 2026- - - Your physician has requested that you have an echocardiogram. Echocardiography is a painless test that uses sound waves to create images of your heart. It provides your doctor with information about the size and shape of your heart and how well your heart's chambers and valves are working. This procedure takes approximately one hour. There are no restrictions for this procedure. Please do NOT wear cologne, perfume, aftershave, or lotions (deodorant is allowed). Please arrive 15 minutes prior to your appointment time.  Please note: We ask at that you not bring children with you during ultrasound (echo/ vascular) testing. Due to room size and safety concerns, children are not allowed in the ultrasound rooms during exams. Our front office staff cannot provide observation of children in our lobby area while testing is being conducted. An adult accompanying a patient to their appointment will only be allowed in the ultrasound room at the discretion of the ultrasound technician under special circumstances. We apologize for any inconvenience.   Follow-Up: At [facility], you and your health needs are our priority.  As part of our continuing mission to provide you with exceptional heart care, our providers are all part of one team.  This team includes your primary  Cardiologist (physician) and Advanced Practice Providers or APPs (Physician Assistants and Nurse Practitioners) who all work together to provide you with the care you need, when you need it.  Your next appointment:   1 year(s)  Provider:   Maude Emmer, MD

## 2024-09-12 ENCOUNTER — Encounter: Payer: Self-pay | Admitting: Internal Medicine

## 2024-09-20 ENCOUNTER — Other Ambulatory Visit: Payer: Self-pay | Admitting: Cardiovascular Disease

## 2024-10-21 ENCOUNTER — Ambulatory Visit

## 2024-10-21 VITALS — Ht 69.0 in | Wt 170.0 lb

## 2024-10-21 DIAGNOSIS — Z860100 Personal history of colon polyps, unspecified: Secondary | ICD-10-CM

## 2024-10-21 DIAGNOSIS — Z800 Family history of malignant neoplasm of digestive organs: Secondary | ICD-10-CM

## 2024-10-21 MED ORDER — NA SULFATE-K SULFATE-MG SULF 17.5-3.13-1.6 GM/177ML PO SOLN: 1.0000 | Freq: Once | ORAL | 0 refills | Status: AC

## 2024-10-21 NOTE — Progress Notes (Signed)

## 2024-11-10 ENCOUNTER — Encounter: Payer: Self-pay | Admitting: Internal Medicine

## 2024-11-11 ENCOUNTER — Encounter: Payer: Self-pay | Admitting: Internal Medicine

## 2024-11-11 ENCOUNTER — Ambulatory Visit: Admitting: Internal Medicine

## 2024-11-11 VITALS — BP 128/64 | HR 59 | Temp 97.3°F | Resp 12 | Ht 69.0 in | Wt 170.0 lb

## 2024-11-11 DIAGNOSIS — Z860101 Personal history of adenomatous and serrated colon polyps: Secondary | ICD-10-CM

## 2024-11-11 DIAGNOSIS — K5730 Diverticulosis of large intestine without perforation or abscess without bleeding: Secondary | ICD-10-CM

## 2024-11-11 DIAGNOSIS — Z1211 Encounter for screening for malignant neoplasm of colon: Secondary | ICD-10-CM

## 2024-11-11 DIAGNOSIS — Z800 Family history of malignant neoplasm of digestive organs: Secondary | ICD-10-CM | POA: Diagnosis not present

## 2024-11-11 DIAGNOSIS — K635 Polyp of colon: Secondary | ICD-10-CM | POA: Diagnosis not present

## 2024-11-11 DIAGNOSIS — Z860100 Personal history of colon polyps, unspecified: Secondary | ICD-10-CM

## 2024-11-11 DIAGNOSIS — K6389 Other specified diseases of intestine: Secondary | ICD-10-CM | POA: Diagnosis not present

## 2024-11-11 DIAGNOSIS — D122 Benign neoplasm of ascending colon: Secondary | ICD-10-CM

## 2024-11-11 DIAGNOSIS — D125 Benign neoplasm of sigmoid colon: Secondary | ICD-10-CM

## 2024-11-11 MED ORDER — SODIUM CHLORIDE 0.9 % IV SOLN: 500.0000 mL | INTRAVENOUS | Status: DC

## 2024-11-11 NOTE — Op Note (Signed)
 [facility] Patient Name: David Cunningham Procedure Date: 11/11/2024 8:38 AM MRN: [id number] Endoscopist: Gordy CHRISTELLA Starch , MD, [id number] Age: 69 Referring MD:  Date of Birth: 12/09/1955 Gender: Male Account #: [id number] Procedure:                Colonoscopy Indications:              High risk colon cancer surveillance: Personal                            history of multiple adenomas, Family history of                            colon cancer in a first-degree relative (mother),                            Last colonoscopy: December 2020 (TA x 2), Dec 2017                            (TA x 4) Medicines:                Monitored Anesthesia Care Procedure:                Pre-Anesthesia Assessment:                           - Prior to the procedure, a History and Physical                            was performed, and patient medications and                            allergies were reviewed. The patient's tolerance of                            previous anesthesia was also reviewed. The risks                            and benefits of the procedure and the sedation                            options and risks were discussed with the patient.                            All questions were answered, and informed consent                            was obtained. Prior Anticoagulants: The patient has                            taken no anticoagulant or antiplatelet agents. ASA                            Grade Assessment: II - A patient with mild systemic  disease. After reviewing the risks and benefits,                            the patient was deemed in satisfactory condition to                            undergo the procedure.                           After obtaining informed consent, the colonoscope                            was passed under direct vision. Throughout the                            procedure, the patient's blood pressure, pulse, and                             oxygen saturations were monitored continuously. The                            Olympus Scope SN [id number] was introduced through the                            anus and advanced to the cecum, identified by                            appendiceal orifice and ileocecal valve. The                            colonoscopy was performed without difficulty. The                            patient tolerated the procedure well. The quality                            of the bowel preparation was good. The ileocecal                            valve, appendiceal orifice, and rectum were                            photographed. Scope In: 8:58:40 AM Scope Out: 9:13:24 AM Scope Withdrawal Time: 0 hours 11 minutes 6 seconds  Total Procedure Duration: 0 hours 14 minutes 44 seconds  Findings:                 The digital rectal exam was normal.                           A 6 mm polyp was found in the ascending colon. The                            polyp was sessile. The polyp was removed with a  cold snare. Resection and retrieval were complete.                           A 5 mm polyp was found in the sigmoid colon. The                            polyp was semi-pedunculated. The polyp was removed                            with a cold snare. Resection and retrieval were                            complete.                           Multiple medium-mouthed and small-mouthed                            diverticula were found in the sigmoid colon,                            descending colon, ascending colon and cecum.                           The retroflexed view of the distal rectum and anal                            verge was normal and showed no anal or rectal                            abnormalities. Complications:            No immediate complications. Estimated Blood Loss:     Estimated blood loss: none. Impression:               - One 6 mm polyp in the ascending  colon, removed                            with a cold snare. Resected and retrieved.                           - One 5 mm polyp in the sigmoid colon, removed with                            a cold snare. Resected and retrieved.                           - Moderate diverticulosis in the sigmoid colon, in                            the descending colon, in the ascending colon and in                            the cecum. Recommendation:           -  Patient has a contact number available for                            emergencies. The signs and symptoms of potential                            delayed complications were discussed with the                            patient. Return to normal activities tomorrow.                            Written discharge instructions were provided to the                            patient.                           - Resume previous diet.                           - Continue present medications.                           - Await pathology results.                           - Repeat colonoscopy in 5 years for surveillance. Gordy CHRISTELLA Starch, MD 11/11/2024 9:20:08 AM This report has been signed electronically.

## 2024-11-11 NOTE — Progress Notes (Signed)
 Called to room to assist during endoscopic procedure.  Patient ID and intended procedure confirmed with present staff. Received instructions for my participation in the procedure from the performing physician.

## 2024-11-11 NOTE — Patient Instructions (Signed)

## 2024-11-11 NOTE — Progress Notes (Signed)
 To pacu, VSS. Report to [email or web address]

## 2024-11-11 NOTE — Progress Notes (Signed)
 "   GASTROENTEROLOGY PROCEDURE H&P NOTE   Primary Care Physician: Dorina Loving, PA-C    Reason for Procedure:  Personal history of adenomatous colon polyps and family history of colon cancer in patient's mother  Plan:    Colonoscopy  Patient is appropriate for endoscopic procedure(s) in the ambulatory ([facility]) setting.  The nature of the procedure, as well as the risks, benefits, and alternatives were carefully and thoroughly reviewed with the patient. Ample time for discussion and questions allowed.  All questions were answered. The patient understood, was satisfied, and agreed with the plan to proceed.    HPI: David Cunningham is a 69 y.o. male who presents for surveillance colonoscopy.  Medical history as below.  Tolerated the prep.  No recent chest pain or shortness of breath.  No abdominal pain today.  Past Medical History:  Diagnosis Date   Allergy    Arthritis    hand, neck   Back pain 09/11/2016   patient denies back pain but has neck pain   Bicuspid aortic valve    DEGENERATIVE DISC DISEASE, CERVICAL SPINE    Heart murmur    never has caused any problems   History of chicken pox    Hypertension    Migraines    last one 2 wks ago- allergy related    MITRAL VALVE PROLAPSE    PSA, INCREASED    TINNITUS, CHRONIC, BILATERAL     Past Surgical History:  Procedure Laterality Date   CERVICAL DISC SURGERY  12/2019   CERVICAL SPINE SURGERY     COLONOSCOPY  2012   [place] hx polyps   CYST REMOVAL NECK     And Face   NASAL SEPTOPLASTY W/ TURBINOPLASTY Bilateral 09/25/2019   Procedure: NASAL SEPTOPLASTY WITH TURBINATE REDUCTION;  Surgeon: Mable Lenis, MD;  Location: [facility];  Service: ENT;  Laterality: Bilateral;   POLYPECTOMY     Colon   PROSTATE BIOPSY  06/2019   negative   ROOT CANAL  11/2023   SINUS ENDO WITH FUSION Bilateral 09/25/2019   Procedure: ENDOSCOPIC SINUS SURGERY WITH FUSION NAVIGATION;  Surgeon: Mable Lenis, MD;  Location:  [facility];  Service: ENT;  Laterality: Bilateral;   SPINE SURGERY  06/17/2019   Tonsillectome     WISDOM TOOTH EXTRACTION      Prior to Admission medications  Medication Sig Start Date End Date Taking? Authorizing Provider  amLODipine  (NORVASC ) 10 MG tablet TAKE 1 TABLET BY MOUTH DAILY 09/23/24  Yes Nishan, Peter C, MD  fexofenadine  (ALLEGRA ) 180 MG tablet Take 1 tablet (180 mg total) by mouth daily. 01/25/23  Yes Christopher Savannah, PA-C  losartan  (COZAAR ) 100 MG tablet TAKE 1 TABLET BY MOUTH DAILY 05/26/24  Yes Saguier, Loving, PA-C  naproxen sodium (ALEVE) 220 MG tablet Take 220 mg by mouth 2 (two) times daily as needed.   Yes [provider]  azelastine  (ASTELIN ) 0.1 % nasal spray Place 2 sprays into both nostrils 2 (two) times daily. Use in each nostril as directed Patient not taking: Reported on 10/21/2024 04/12/23   Saguier, Loving, PA-C  diclofenac  Sodium (VOLTAREN ) 1 % GEL Apply 2 g topically 4 (four) times daily. 03/02/20   Gladis Elsie BROCKS, PA-C  fluticasone  (FLONASE ) 50 MCG/ACT nasal spray Place 2 sprays into both nostrils daily. 04/18/23   Saguier, Loving, PA-C  fluticasone  (FLONASE ) 50 MCG/ACT nasal spray Place 2 sprays into both nostrils daily. 04/18/24   Saguier, Loving, PA-C  montelukast  (SINGULAIR ) 10 MG tablet Take 1  tablet (10 mg total) by mouth at bedtime. Patient not taking: Reported on 10/21/2024 09/06/23   Saguier, [place], PA-C  sodium chloride  (OCEAN) 0.65 % SOLN nasal spray Place 1 spray into both nostrils as needed for congestion. Patient not taking: Reported on 10/21/2024    [provider]  tobramycin  (TOBREX ) 0.3 % ophthalmic solution Place 2 drops into the right eye every 4 (four) hours. 09/06/23   Saguier, [place], PA-C    Current Outpatient Medications  Medication Sig Dispense Refill   amLODipine  (NORVASC ) 10 MG tablet TAKE 1 TABLET BY MOUTH DAILY 90 tablet 3   fexofenadine  (ALLEGRA ) 180 MG tablet Take 1 tablet (180 mg total) by mouth  daily. 90 tablet 0   losartan  (COZAAR ) 100 MG tablet TAKE 1 TABLET BY MOUTH DAILY 90 tablet 1   naproxen sodium (ALEVE) 220 MG tablet Take 220 mg by mouth 2 (two) times daily as needed.     azelastine  (ASTELIN ) 0.1 % nasal spray Place 2 sprays into both nostrils 2 (two) times daily. Use in each nostril as directed (Patient not taking: Reported on 10/21/2024) 30 mL 0   diclofenac  Sodium (VOLTAREN ) 1 % GEL Apply 2 g topically 4 (four) times daily. 100 g 1   fluticasone  (FLONASE ) 50 MCG/ACT nasal spray Place 2 sprays into both nostrils daily. 16 g 1   fluticasone  (FLONASE ) 50 MCG/ACT nasal spray Place 2 sprays into both nostrils daily. 16 g 1   montelukast  (SINGULAIR ) 10 MG tablet Take 1 tablet (10 mg total) by mouth at bedtime. (Patient not taking: Reported on 10/21/2024) 30 tablet 3   sodium chloride  (OCEAN) 0.65 % SOLN nasal spray Place 1 spray into both nostrils as needed for congestion. (Patient not taking: Reported on 10/21/2024)     tobramycin  (TOBREX ) 0.3 % ophthalmic solution Place 2 drops into the right eye every 4 (four) hours. 5 mL 0   Current Facility-Administered Medications  Medication Dose Route Frequency Provider Last Rate Last Admin   0.9 %  sodium chloride  infusion  500 mL Intravenous Continuous Dorin Stooksbury, Gordy HERO, MD        Allergies as of 11/11/2024   (No Known Allergies)    Family History  Problem Relation Age of Onset   Colon cancer Mother 29       Deceased   Lung cancer Mother    Liver cancer Mother    Cancer Mother    Breast cancer Sister    Cancer Sister    Lymphoma Father 55       Deceased   Diabetes Father    Cancer Father    Cancer Other        Paternal Grandparents   Cancer Other        Maternal Grandparents   Emphysema Paternal Grandfather    Emphysema Maternal Grandfather    Cancer Paternal Aunt    Cancer Maternal Aunt    Esophageal cancer Neg Hx    Stomach cancer Neg Hx    Rectal cancer Neg Hx     Social History   Socioeconomic History    Marital status: Married    Spouse name: Not on file   Number of children: Not on file   Years of education: Not on file   Highest education level: Bachelor's degree (e.g., BA, AB, BS)  Occupational History   Occupation: Retired  Tobacco Use   Smoking status: Every Day    Current packs/day: 1.00    Average packs/day: 1 pack/day for 40.0 years (40.0 ttl  pk-yrs)    Types: Cigarettes   Smokeless tobacco: Never  Vaping Use   Vaping status: Never Used  Substance and Sexual Activity   Alcohol use: Yes    Alcohol/week: 14.0 standard drinks of alcohol    Types: 7 Glasses of wine, 7 Shots of liquor per week    Comment: 1 glass of scotch daily   Drug use: No   Sexual activity: Yes    Partners: Female    Comment: wife  Other Topics Concern   Not on file  Social History Narrative   Not on file   Social Drivers of Health   Tobacco Use: High Risk (11/11/2024)   Patient History    Smoking Tobacco Use: Every Day    Smokeless Tobacco Use: Never    Passive Exposure: Not on file  Financial Resource Strain: Low Risk (04/17/2024)   Overall Financial Resource Strain (CARDIA)    Difficulty of Paying Living Expenses: Not hard at all  Food Insecurity: No Food Insecurity (04/17/2024)   Epic    Worried About Programme Researcher, Broadcasting/film/video in the Last Year: Never true    Ran Out of Food in the Last Year: Never true  Transportation Needs: No Transportation Needs (04/17/2024)   Epic    Lack of Transportation (Medical): No    Lack of Transportation (Non-Medical): No  Physical Activity: Sufficiently Active (04/17/2024)   Exercise Vital Sign    Days of Exercise per Week: 5 days    Minutes of Exercise per Session: 120 min  Stress: No Stress Concern Present (04/17/2024)   [employer] of Occupational Health - Occupational Stress Questionnaire    Feeling of Stress: Not at all  Social Connections: Moderately Isolated (04/17/2024)   Social Connection and Isolation Panel    Frequency of Communication with Friends  and Family: Three times a week    Frequency of Social Gatherings with Friends and Family: Three times a week    Attends Religious Services: Never    Active Member of Clubs or Organizations: No    Attends Banker Meetings: Not on file    Marital Status: Married  Intimate Partner Violence: Not At Risk (04/15/2024)   Humiliation, Afraid, Rape, and Kick questionnaire    Fear of Current or Ex-Partner: No    Emotionally Abused: No    Physically Abused: No    Sexually Abused: No  Depression (PHQ2-9): Low Risk (04/18/2024)   Depression (PHQ2-9)    PHQ-2 Score: 0  Alcohol Screen: Low Risk (04/17/2024)   Alcohol Screen    Last Alcohol Screening Score (AUDIT): 5  Housing: Low Risk (04/17/2024)   Epic    Unable to Pay for Housing in the Last Year: No    Number of Times Moved in the Last Year: 0    Homeless in the Last Year: No  Utilities: Not At Risk (04/15/2024)   AHC Utilities    Threatened with loss of utilities: No  Health Literacy: Adequate Health Literacy (04/15/2024)   B1300 Health Literacy    Frequency of need for help with medical instructions: Never    Physical Exam: Vital signs in last 24 hours: @BP  (!) 147/65   Pulse 71   Temp (!) 97.3 F (36.3 C) (Temporal)   Ht 5' 9 (1.753 m)   Wt 170 lb (77.1 kg)   SpO2 98%   BMI 25.10 kg/m  GEN: NAD EYE: Sclerae anicteric ENT: MMM CV: Non-tachycardic Pulm: CTA b/l GI: Soft, NT/ND NEURO:  Alert & Oriented x  3   Gordy Starch, MD [facility]  11/11/2024 8:43 AM  "

## 2024-11-12 ENCOUNTER — Telehealth: Payer: Self-pay

## 2024-11-12 NOTE — Telephone Encounter (Signed)
 Attempted f/u call. No answer, left VM.

## 2024-11-13 ENCOUNTER — Ambulatory Visit: Admitting: Medical

## 2024-11-13 VITALS — BP 116/70 | HR 72 | Temp 97.9°F | Resp 15 | Ht 69.0 in | Wt 171.6 lb

## 2024-11-13 DIAGNOSIS — J069 Acute upper respiratory infection, unspecified: Secondary | ICD-10-CM | POA: Diagnosis not present

## 2024-11-13 DIAGNOSIS — R051 Acute cough: Secondary | ICD-10-CM

## 2024-11-13 DIAGNOSIS — H6690 Otitis media, unspecified, unspecified ear: Secondary | ICD-10-CM | POA: Diagnosis not present

## 2024-11-13 LAB — SURGICAL PATHOLOGY

## 2024-11-13 LAB — POCT INFLUENZA A/B
Influenza A, POC: NEGATIVE
Influenza B, POC: NEGATIVE

## 2024-11-13 MED ORDER — AMOXICILLIN-POT CLAVULANATE 875-125 MG PO TABS: 1.0000 | ORAL_TABLET | Freq: Two times a day (BID) | ORAL | 0 refills | Status: AC

## 2024-11-13 MED ORDER — BENZONATATE 100 MG PO CAPS: 100.0000 mg | ORAL_CAPSULE | Freq: Three times a day (TID) | ORAL | 0 refills | Status: AC | PRN

## 2024-11-13 NOTE — Progress Notes (Signed)
 "  Subjective:    Patient ID: David Cunningham, male    DOB: 09-Sep-1956, 69 y.o.   MRN: [id number]  HPI  David Cunningham is a 69 year old male who presents with a persistent cough and upper respiratory symptoms.  He has had a mostly dry cough for about two weeks, occasionally productive with small amounts of mucus. He has coughing fits two to three times daily lasting two to five minutes, sometimes causing dry heaving and disrupting his sleep.  His upper respiratory symptoms began at the same time, with mostly clear but intermittently yellow-green nasal discharge and a sensation of stuffed ears. He had brief severe left ear pain lasting 30 to 40 seconds during a recent flight. He has chronic left-sided nasal stuffiness after sinus surgery five years ago.  He has taken Nyquil and DayQuil for symptom relief and has not used prescription medications. He denies fevers, chills, or sweats.    Review of Systems  Constitutional:  Negative for chills and fever.  HENT:  Positive for congestion, sinus pressure and sinus pain.   Respiratory:  Positive for cough. Negative for wheezing.   Cardiovascular:  Negative for chest pain and palpitations.  Gastrointestinal:  Negative for abdominal pain and constipation.  Genitourinary:  Negative for dysuria and frequency.  Musculoskeletal:  Negative for back pain and myalgias.  Neurological:  Negative for dizziness, syncope, weakness and light-headedness.       Hx of intermittent migraines since youth but none presently.  Hematological:  Negative for adenopathy.  Psychiatric/Behavioral:  Negative for behavioral problems.      Past Medical History:  Diagnosis Date   Allergy    Arthritis    hand, neck   Back pain 09/11/2016   patient denies back pain but has neck pain   Bicuspid aortic valve    DEGENERATIVE DISC DISEASE, CERVICAL SPINE    Heart murmur    never has caused any problems   History of chicken pox    Hypertension    Migraines     last one 2 wks ago- allergy related    MITRAL VALVE PROLAPSE    PSA, INCREASED    TINNITUS, CHRONIC, BILATERAL      Social History   Socioeconomic History   Marital status: Married    Spouse name: Not on file   Number of children: Not on file   Years of education: Not on file   Highest education level: Bachelor's degree (e.g., BA, AB, BS)  Occupational History   Occupation: Retired  Tobacco Use   Smoking status: Every Day    Current packs/day: 1.00    Average packs/day: 1 pack/day for 40.0 years (40.0 ttl pk-yrs)    Types: Cigarettes   Smokeless tobacco: Never  Vaping Use   Vaping status: Never Used  Substance and Sexual Activity   Alcohol use: Yes    Alcohol/week: 14.0 standard drinks of alcohol    Types: 7 Glasses of wine, 7 Shots of liquor per week    Comment: 1 glass of scotch daily   Drug use: No   Sexual activity: Yes    Partners: Female    Comment: wife  Other Topics Concern   Not on file  Social History Narrative   Not on file   Social Drivers of Health   Tobacco Use: High Risk (11/11/2024)   Patient History    Smoking Tobacco Use: Every Day    Smokeless Tobacco Use: Never    Passive Exposure: Not on file  Financial Resource Strain: Low Risk (11/12/2024)   Overall Financial Resource Strain (CARDIA)    Difficulty of Paying Living Expenses: Not hard at all  Food Insecurity: No Food Insecurity (11/12/2024)   Epic    Worried About Programme Researcher, Broadcasting/film/video in the Last Year: Never true    Ran Out of Food in the Last Year: Never true  Transportation Needs: No Transportation Needs (11/12/2024)   Epic    Lack of Transportation (Medical): No    Lack of Transportation (Non-Medical): No  Physical Activity: Sufficiently Active (11/12/2024)   Exercise Vital Sign    Days of Exercise per Week: 4 days    Minutes of Exercise per Session: 90 min  Stress: No Stress Concern Present (11/12/2024)   [employer] of Occupational Health - Occupational Stress Questionnaire    Feeling  of Stress: Not at all  Social Connections: Moderately Isolated (11/12/2024)   Social Connection and Isolation Panel    Frequency of Communication with Friends and Family: More than three times a week    Frequency of Social Gatherings with Friends and Family: Three times a week    Attends Religious Services: Never    Active Member of Clubs or Organizations: No    Attends Banker Meetings: Not on file    Marital Status: Married  Intimate Partner Violence: Not At Risk (04/15/2024)   Humiliation, Afraid, Rape, and Kick questionnaire    Fear of Current or Ex-Partner: No    Emotionally Abused: No    Physically Abused: No    Sexually Abused: No  Depression (PHQ2-9): Low Risk (04/18/2024)   Depression (PHQ2-9)    PHQ-2 Score: 0  Alcohol Screen: Low Risk (11/12/2024)   Alcohol Screen    Last Alcohol Screening Score (AUDIT): 5  Housing: Low Risk (11/12/2024)   Epic    Unable to Pay for Housing in the Last Year: No    Number of Times Moved in the Last Year: 0    Homeless in the Last Year: No  Utilities: Not At Risk (04/15/2024)   AHC Utilities    Threatened with loss of utilities: No  Health Literacy: Adequate Health Literacy (04/15/2024)   B1300 Health Literacy    Frequency of need for help with medical instructions: Never    Past Surgical History:  Procedure Laterality Date   CERVICAL DISC SURGERY  12/2019   CERVICAL SPINE SURGERY     COLONOSCOPY  2012   [place] hx polyps   CYST REMOVAL NECK     And Face   NASAL SEPTOPLASTY W/ TURBINOPLASTY Bilateral 09/25/2019   Procedure: NASAL SEPTOPLASTY WITH TURBINATE REDUCTION;  Surgeon: Mable Lenis, MD;  Location: [facility];  Service: ENT;  Laterality: Bilateral;   POLYPECTOMY     Colon   PROSTATE BIOPSY  06/2019   negative   ROOT CANAL  11/2023   SINUS ENDO WITH FUSION Bilateral 09/25/2019   Procedure: ENDOSCOPIC SINUS SURGERY WITH FUSION NAVIGATION;  Surgeon: Mable Lenis, MD;  Location: [facility];  Service: ENT;  Laterality: Bilateral;   SPINE SURGERY  06/17/2019   Tonsillectome     WISDOM TOOTH EXTRACTION      Family History  Problem Relation Age of Onset   Colon cancer Mother 22       Deceased   Lung cancer Mother    Liver cancer Mother    Cancer Mother    Breast cancer Sister    Cancer Sister    Lymphoma Father 49  Deceased   Diabetes Father    Cancer Father    Cancer Other        Paternal Grandparents   Cancer Other        Maternal Grandparents   Emphysema Paternal Grandfather    Emphysema Maternal Grandfather    Cancer Paternal Aunt    Cancer Maternal Aunt    Esophageal cancer Neg Hx    Stomach cancer Neg Hx    Rectal cancer Neg Hx     Allergies[1]  Medications Ordered Prior to Encounter[2]  BP 116/70   Pulse 72   Temp 97.9 F (36.6 C) (Oral)   Resp 15   Ht 5' 9 (1.753 m)   Wt 171 lb 9.6 oz (77.8 kg)   SpO2 99%   BMI 25.34 kg/m        Objective:   Physical Exam General- No acute distress. Pleasant patient. Neck- Full range of motion, no jvd Lungs- Clear, even and unlabored. Heart- regular rate and rhythm. Neurologic- CNII- XII grossly intact.   Heent- no sinus pressure. Canals clear. Left tm mild red upper 1/3 portion. Rt tim is normal.       Assessment & Plan:   Acute upper respiratory infection Persistent cough with nasal congestion, possible  sinus infection. Differential includes bronchial involvement. - Prescribed benzonatate  for cough. -Flonase  nasal spray for nasal congestion. - Prescribed Augmentin  twice a day for ten days for sinus and lung coverage.(note left ear does look early infected) - Advised to monitor for worsening chest congestion or increased mucus production; consider chest x-ray if occurs. - Discussed potential use of narcotic cough suppressants if benzonatate  is ineffective.  Otitis media Left ear redness, possibly secondary to nasal congestion and recent flying. - Prescribed Augmentin   for potential ear infection.  Follow up 10 days or sooner if needed  Dierdra Salameh, PA-C     [1] No Known Allergies [2]  Current Outpatient Medications on File Prior to Visit  Medication Sig Dispense Refill   amLODipine  (NORVASC ) 10 MG tablet TAKE 1 TABLET BY MOUTH DAILY 90 tablet 3   losartan  (COZAAR ) 100 MG tablet TAKE 1 TABLET BY MOUTH DAILY 90 tablet 1   naproxen sodium (ALEVE) 220 MG tablet Take 220 mg by mouth 2 (two) times daily as needed.     azelastine  (ASTELIN ) 0.1 % nasal spray Place 2 sprays into both nostrils 2 (two) times daily. Use in each nostril as directed (Patient not taking: Reported on 11/13/2024) 30 mL 0   diclofenac  Sodium (VOLTAREN ) 1 % GEL Apply 2 g topically 4 (four) times daily. (Patient not taking: Reported on 11/13/2024) 100 g 1   fexofenadine  (ALLEGRA ) 180 MG tablet Take 1 tablet (180 mg total) by mouth daily. (Patient not taking: Reported on 11/13/2024) 90 tablet 0   fluticasone  (FLONASE ) 50 MCG/ACT nasal spray Place 2 sprays into both nostrils daily. (Patient not taking: Reported on 11/13/2024) 16 g 1   fluticasone  (FLONASE ) 50 MCG/ACT nasal spray Place 2 sprays into both nostrils daily. (Patient not taking: Reported on 11/13/2024) 16 g 1   montelukast  (SINGULAIR ) 10 MG tablet Take 1 tablet (10 mg total) by mouth at bedtime. (Patient not taking: Reported on 11/13/2024) 30 tablet 3   sodium chloride  (OCEAN) 0.65 % SOLN nasal spray Place 1 spray into both nostrils as needed for congestion. (Patient not taking: Reported on 11/13/2024)     No current facility-administered medications on file prior to visit.   "

## 2024-11-13 NOTE — Patient Instructions (Signed)
 Acute upper respiratory infection Persistent cough with nasal congestion, possible  sinus infection. Differential includes bronchial involvement. - Prescribed benzonatate  for cough. -Flonase  nasal spray for nasal congestion. - Prescribed Augmentin  twice a day for ten days for sinus and lung coverage.(note left ear does look early infected) - Advised to monitor for worsening chest congestion or increased mucus production; consider chest x-ray if occurs. - Discussed potential use of narcotic cough suppressants if benzonatate  is ineffective.  Otitis media Left ear redness, possibly secondary to nasal congestion and recent flying. - Prescribed Augmentin  for potential ear infection.  Follow up 10 days or sooner if needed

## 2024-11-14 ENCOUNTER — Ambulatory Visit: Payer: Self-pay | Admitting: Internal Medicine

## 2025-04-21 ENCOUNTER — Ambulatory Visit
